# Patient Record
Sex: Male | Born: 1955
Health system: Southern US, Community
[De-identification: ages and names within clinical notes are randomized; demographics above are authoritative.]

## PROBLEM LIST (undated history)

## (undated) DIAGNOSIS — E785 Hyperlipidemia, unspecified: Secondary | ICD-10-CM

## (undated) DIAGNOSIS — R569 Unspecified convulsions: Secondary | ICD-10-CM

## (undated) DIAGNOSIS — I10 Essential (primary) hypertension: Secondary | ICD-10-CM

## (undated) DIAGNOSIS — Z8619 Personal history of other infectious and parasitic diseases: Secondary | ICD-10-CM

## (undated) DIAGNOSIS — IMO0001 Reserved for inherently not codable concepts without codable children: Secondary | ICD-10-CM

## (undated) HISTORY — DX: Essential (primary) hypertension: I10

## (undated) HISTORY — PX: CT SCAN: SHX5351

## (undated) HISTORY — PX: TONSILLECTOMY: SUR1361

## (undated) HISTORY — DX: Personal history of other infectious and parasitic diseases: Z86.19

## (undated) HISTORY — DX: Hyperlipidemia, unspecified: E78.5

## (undated) HISTORY — PX: BACK SURGERY: SHX140

## (undated) HISTORY — PX: JOINT REPLACEMENT: SHX530

---
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
## 1991-07-19 HISTORY — PX: KNEE SURGERY: SHX244

## 2002-07-18 HISTORY — PX: ROTATOR CUFF REPAIR: SHX139

## 2005-06-10 ENCOUNTER — Ambulatory Visit: Payer: Self-pay | Admitting: Internal Medicine

## 2007-07-19 HISTORY — PX: LUNG SURGERY: SHX703

## 2007-11-03 ENCOUNTER — Emergency Department: Payer: Self-pay | Admitting: Internal Medicine

## 2007-11-03 ENCOUNTER — Other Ambulatory Visit: Payer: Self-pay

## 2008-01-24 DIAGNOSIS — Z8249 Family history of ischemic heart disease and other diseases of the circulatory system: Secondary | ICD-10-CM | POA: Insufficient documentation

## 2008-01-24 DIAGNOSIS — I10 Essential (primary) hypertension: Secondary | ICD-10-CM | POA: Insufficient documentation

## 2008-01-30 DIAGNOSIS — E782 Mixed hyperlipidemia: Secondary | ICD-10-CM

## 2008-02-25 DIAGNOSIS — F419 Anxiety disorder, unspecified: Secondary | ICD-10-CM

## 2012-03-15 LAB — PSA: PSA: 0.4

## 2012-09-22 ENCOUNTER — Emergency Department: Payer: Self-pay | Admitting: Internal Medicine

## 2012-10-23 ENCOUNTER — Ambulatory Visit: Payer: Self-pay | Admitting: Family Medicine

## 2013-12-27 ENCOUNTER — Ambulatory Visit: Payer: Self-pay | Admitting: Family Medicine

## 2014-02-21 ENCOUNTER — Other Ambulatory Visit: Payer: Self-pay | Admitting: Family Medicine

## 2014-02-21 DIAGNOSIS — H539 Unspecified visual disturbance: Secondary | ICD-10-CM

## 2014-02-21 DIAGNOSIS — R51 Headache: Secondary | ICD-10-CM

## 2014-02-21 DIAGNOSIS — R42 Dizziness and giddiness: Secondary | ICD-10-CM

## 2014-02-21 DIAGNOSIS — R519 Headache, unspecified: Secondary | ICD-10-CM

## 2014-02-27 ENCOUNTER — Ambulatory Visit
Admission: RE | Admit: 2014-02-27 | Discharge: 2014-02-27 | Disposition: A | Discharge status: Home / Self Care | Payer: BC Managed Care – PPO | Source: Ambulatory Visit | Attending: Family Medicine | Admitting: Family Medicine

## 2014-02-27 DIAGNOSIS — R51 Headache: Secondary | ICD-10-CM

## 2014-02-27 DIAGNOSIS — R42 Dizziness and giddiness: Secondary | ICD-10-CM

## 2014-02-27 DIAGNOSIS — R519 Headache, unspecified: Secondary | ICD-10-CM

## 2014-02-27 DIAGNOSIS — H539 Unspecified visual disturbance: Secondary | ICD-10-CM

## 2014-02-27 MED ORDER — GADOBENATE DIMEGLUMINE 529 MG/ML IV SOLN
18.0000 mL | Freq: Once | INTRAVENOUS | Status: AC | PRN
  Administered 2014-02-27: 18 mL via INTRAVENOUS

## 2014-07-31 ENCOUNTER — Ambulatory Visit: Payer: Self-pay

## 2014-08-25 ENCOUNTER — Emergency Department: Payer: Self-pay | Admitting: Emergency Medicine

## 2014-08-25 LAB — COMPREHENSIVE METABOLIC PANEL
ALK PHOS: 99 U/L (ref 46–116)
ALT: 29 U/L (ref 14–63)
AST: 26 U/L (ref 15–37)
Albumin: 3.6 g/dL (ref 3.4–5.0)
Anion Gap: 8 (ref 7–16)
BILIRUBIN TOTAL: 0.3 mg/dL (ref 0.2–1.0)
BUN: 14 mg/dL (ref 7–18)
CALCIUM: 8.4 mg/dL — AB (ref 8.5–10.1)
CO2: 25 mmol/L (ref 21–32)
Chloride: 108 mmol/L — ABNORMAL HIGH (ref 98–107)
Creatinine: 0.99 mg/dL (ref 0.60–1.30)
EGFR (African American): >60
Glucose: 112 mg/dL — ABNORMAL HIGH (ref 65–99)
Osmolality: 282 (ref 275–301)
Potassium: 4 mmol/L (ref 3.5–5.1)
Sodium: 141 mmol/L (ref 136–145)
Total Protein: 6.8 g/dL (ref 6.4–8.2)

## 2014-08-25 LAB — CBC
HCT: 41.4 % (ref 40.0–52.0)
HGB: 13.7 g/dL (ref 13.0–18.0)
MCH: 30.4 pg (ref 26.0–34.0)
MCHC: 33.2 g/dL (ref 32.0–36.0)
MCV: 92 fL (ref 80–100)
Platelet: 235 10*3/uL (ref 150–440)
RBC: 4.51 10*6/uL (ref 4.40–5.90)
RDW: 13.3 % (ref 11.5–14.5)
WBC: 8.7 10*3/uL (ref 3.8–10.6)

## 2014-08-25 LAB — TROPONIN I: Troponin-I: <0.02 ng/mL

## 2014-08-25 LAB — LIPASE, BLOOD: Lipase: 191 U/L (ref 73–393)

## 2014-10-08 LAB — BASIC METABOLIC PANEL
BUN: 17 mg/dL (ref 4–21)
Creatinine: 0.9 mg/dL (ref 0.6–1.3)
Glucose: 97 mg/dL
Potassium: 4.9 mmol/L (ref 3.4–5.3)
Sodium: 140 mmol/L (ref 137–147)

## 2014-10-08 LAB — LIPID PANEL
CHOLESTEROL: 167 mg/dL (ref 0–200)
HDL: 45 mg/dL (ref 35–70)
LDL Cholesterol: 81 mg/dL
Triglycerides: 204 mg/dL — AB (ref 40–160)

## 2014-10-08 LAB — CBC AND DIFFERENTIAL
HCT: 42 % (ref 41–53)
Hemoglobin: 14.2 g/dL (ref 13.5–17.5)
Platelets: 304 10*3/uL (ref 150–399)
WBC: 8.6 10^3/mL

## 2014-10-08 LAB — HEPATIC FUNCTION PANEL
ALT: 20 U/L (ref 10–40)
AST: 16 U/L (ref 14–40)

## 2014-12-29 ENCOUNTER — Other Ambulatory Visit: Payer: Self-pay | Admitting: *Deleted

## 2014-12-29 MED ORDER — HYDROCODONE-ACETAMINOPHEN 7.5-325 MG PO TABS: 2.0000 | ORAL_TABLET | ORAL | Status: DC | PRN

## 2014-12-29 NOTE — Telephone Encounter (Signed)
Refill request for Hydrocodone 7-325 mg Last filled by MD on- 12/12/2014 #100 x0 Last Appt: 10/07/2014 Next Appt: none Please advise refill?

## 2015-01-05 ENCOUNTER — Other Ambulatory Visit: Payer: Self-pay | Admitting: Family Medicine

## 2015-01-09 ENCOUNTER — Ambulatory Visit: Payer: No Typology Code available for payment source | Admitting: Family Medicine

## 2015-01-12 DIAGNOSIS — S01111A Laceration without foreign body of right eyelid and periocular area, initial encounter: Secondary | ICD-10-CM | POA: Insufficient documentation

## 2015-01-12 DIAGNOSIS — F329 Major depressive disorder, single episode, unspecified: Secondary | ICD-10-CM | POA: Insufficient documentation

## 2015-01-12 DIAGNOSIS — M549 Dorsalgia, unspecified: Secondary | ICD-10-CM

## 2015-01-12 DIAGNOSIS — R55 Syncope and collapse: Secondary | ICD-10-CM | POA: Insufficient documentation

## 2015-01-12 DIAGNOSIS — S229XXA Fracture of bony thorax, part unspecified, initial encounter for closed fracture: Secondary | ICD-10-CM

## 2015-01-12 DIAGNOSIS — N529 Male erectile dysfunction, unspecified: Secondary | ICD-10-CM | POA: Insufficient documentation

## 2015-01-12 DIAGNOSIS — R0602 Shortness of breath: Secondary | ICD-10-CM | POA: Insufficient documentation

## 2015-01-12 DIAGNOSIS — G4700 Insomnia, unspecified: Secondary | ICD-10-CM | POA: Insufficient documentation

## 2015-01-12 DIAGNOSIS — R51 Headache: Secondary | ICD-10-CM

## 2015-01-12 DIAGNOSIS — M25562 Pain in left knee: Secondary | ICD-10-CM | POA: Insufficient documentation

## 2015-01-12 DIAGNOSIS — M7022 Olecranon bursitis, left elbow: Secondary | ICD-10-CM | POA: Insufficient documentation

## 2015-01-12 DIAGNOSIS — R194 Change in bowel habit: Secondary | ICD-10-CM | POA: Insufficient documentation

## 2015-01-12 DIAGNOSIS — F32A Depression, unspecified: Secondary | ICD-10-CM | POA: Insufficient documentation

## 2015-01-12 DIAGNOSIS — N4601 Organic azoospermia: Secondary | ICD-10-CM | POA: Insufficient documentation

## 2015-01-12 DIAGNOSIS — G8929 Other chronic pain: Secondary | ICD-10-CM | POA: Insufficient documentation

## 2015-01-12 DIAGNOSIS — M5030 Other cervical disc degeneration, unspecified cervical region: Secondary | ICD-10-CM | POA: Insufficient documentation

## 2015-01-12 DIAGNOSIS — R519 Headache, unspecified: Secondary | ICD-10-CM | POA: Insufficient documentation

## 2015-01-12 DIAGNOSIS — H538 Other visual disturbances: Secondary | ICD-10-CM | POA: Insufficient documentation

## 2015-01-12 DIAGNOSIS — E291 Testicular hypofunction: Secondary | ICD-10-CM | POA: Insufficient documentation

## 2015-01-12 DIAGNOSIS — E669 Obesity, unspecified: Secondary | ICD-10-CM | POA: Insufficient documentation

## 2015-01-12 DIAGNOSIS — K649 Unspecified hemorrhoids: Secondary | ICD-10-CM | POA: Insufficient documentation

## 2015-01-12 DIAGNOSIS — F410 Panic disorder [episodic paroxysmal anxiety] without agoraphobia: Secondary | ICD-10-CM | POA: Insufficient documentation

## 2015-01-12 DIAGNOSIS — R42 Dizziness and giddiness: Secondary | ICD-10-CM | POA: Insufficient documentation

## 2015-01-12 HISTORY — DX: Fracture of bony thorax, part unspecified, initial encounter for closed fracture: S22.9XXA

## 2015-01-13 ENCOUNTER — Encounter: Payer: Self-pay | Admitting: Family Medicine

## 2015-01-13 ENCOUNTER — Ambulatory Visit (INDEPENDENT_AMBULATORY_CARE_PROVIDER_SITE_OTHER): Payer: No Typology Code available for payment source | Admitting: Family Medicine

## 2015-01-13 VITALS — BP 144/88 | HR 128 | Temp 97.8°F | Resp 18 | Wt 206.0 lb

## 2015-01-13 DIAGNOSIS — G4700 Insomnia, unspecified: Secondary | ICD-10-CM | POA: Diagnosis not present

## 2015-01-13 DIAGNOSIS — M5432 Sciatica, left side: Secondary | ICD-10-CM

## 2015-01-13 MED ORDER — METHOCARBAMOL 750 MG PO TABS: 750.0000 mg | ORAL_TABLET | Freq: Four times a day (QID) | ORAL | Status: DC

## 2015-01-13 MED ORDER — HYDROCODONE-ACETAMINOPHEN 7.5-325 MG PO TABS: ORAL_TABLET | ORAL | Status: DC

## 2015-01-13 MED ORDER — ZOLPIDEM TARTRATE ER 12.5 MG PO TBCR: 12.5000 mg | EXTENDED_RELEASE_TABLET | Freq: Every evening | ORAL | Status: DC | PRN

## 2015-01-13 MED ORDER — PREDNISONE 10 MG PO TABS: ORAL_TABLET | ORAL | Status: DC

## 2015-01-13 NOTE — Progress Notes (Signed)
Patient ID: Dylan [name], male   DOB: October 26, 1955, 59 y.o.   MRN: [id number]       Patient: Dylan [name] Male    DOB: 01-Feb-1956   59 y.o.   MRN: [id number] Visit Date: 01/13/2015  Today's Provider: [name], MD   Chief Complaint  Patient presents with  . Back Pain  . Headache   Subjective:    Back Pain This is a new (He reports that this back pain is not like his other back pain has been) problem. Episode onset: " a couple weeks" The problem occurs constantly. The quality of the pain is described as shooting, stabbing and burning. The pain radiates to the left knee. Associated symptoms include headaches, leg pain and paresthesias. Pertinent negatives include no abdominal pain, bladder incontinence, bowel incontinence, chest pain, dysuria, fever, numbness, paresis, pelvic pain, perianal numbness, tingling, weakness or weight loss. Risk factors include recent trauma (Pt thinks he may have hurt it working in the yard).  Headache  This is a chronic problem. The current episode started more than 1 month ago. The problem occurs daily. The problem has been unchanged. The pain is located in the occipital region. The pain quality is similar to prior headaches. Associated symptoms include back pain. Pertinent negatives include no abdominal pain, fever, numbness, tingling, weakness or weight loss.  Pt reports that he has had these headaches since his head trauma back in February. He thinks it could be related to stress and depression. He is going out of town this weekend and thinks this could help him but wants to know if Dr. Sherrie Mustache feels he is ok to go.   Back pain suddenly while doing yard work. Pain goes into left groin and into back of  knee.     Previous Medications   AMLODIPINE (NORVASC) 10 MG TABLET    Take by mouth.   ASPIRIN 81 MG TABLET    Take by mouth.   BUTALBITAL-APAP-CAFFEINE 50-325-40 MG PER CAPSULE    Take by mouth.   DIPHENOXYLATE-ATROPINE (LOMOTIL) 2.5-0.025 MG PER TABLET    Take  by mouth.   HYDROCODONE-ACETAMINOPHEN (NORCO) 7.5-325 MG PER TABLET    Take 2 tablets by mouth every 4 (four) hours as needed for moderate pain.   LORAZEPAM (ATIVAN) 1 MG TABLET    Take by mouth.   METOPROLOL TARTRATE (LOPRESSOR) 25 MG TABLET    Take by mouth.   SIMVASTATIN (ZOCOR) 40 MG TABLET    Take by mouth.   TRAZODONE (DESYREL) 50 MG TABLET    1-3 TABLET, ORAL, AT BEDTIME   ZOLPIDEM (AMBIEN) 10 MG TABLET    Take by mouth.    Review of Systems  Constitutional: Negative.  Negative for fever and weight loss.  Eyes: Negative.   Respiratory: Negative.   Cardiovascular: Negative.  Negative for chest pain.  Gastrointestinal: Negative.  Negative for abdominal pain and bowel incontinence.  Endocrine: Negative.   Genitourinary: Negative.  Negative for bladder incontinence, dysuria and pelvic pain.  Musculoskeletal: Positive for back pain.  Skin: Negative.   Allergic/Immunologic: Negative.   Neurological: Positive for headaches and paresthesias. Negative for tingling, weakness and numbness.  Hematological: Negative.   Psychiatric/Behavioral: Negative.     History  Substance Use Topics  . Smoking status: Former Smoker -- 1.00 packs/day for 30 years    Types: Cigarettes    Quit date: 07/18/2002  . Smokeless tobacco: Not on file  . Alcohol Use: No   Objective:   BP 144/88 mmHg  Pulse  128  Temp(Src) 97.8 F (36.6 C)  Resp 18  Wt 206 lb (93.441 kg)  Physical Exam  General Appearance:    Alert, cooperative, no distress  Eyes:    PERRL, conjunctiva/corneas clear, EOM's intact       Lungs:     Clear to auscultation bilaterally, respirations unlabored  Heart:    Regular rate and rhythm  Neurologic:   Awake, alert, oriented x 3. No apparent focal neurological           defect.   MS:   Tender lower lumbar spine and left paralumbar muscles. Left hip extension limited due to pain upon flexion. No gross deformities.        Assessment & Plan:     1. Sciatica associated with disorder  of lumbar spine, left  - predniSONE (DELTASONE) 10 MG tablet; 6 tablets for 2 days, then 5 for 2 days, then 4 for 2 days, then 3 for 2 days, then 2 for 2 days, then 1 for 2 days.  Dispense: 42 tablet; Refill: 0 - methocarbamol (ROBAXIN-750) 750 MG tablet; Take 1 tablet (750 mg total) by mouth 4 (four) times daily.  Dispense: 30 tablet; Refill: 1 - HYDROcodone-acetaminophen (NORCO) 7.5-325 MG per tablet; 1-2 every four hours as needed  Dispense: 100 tablet; Refill: 0 Call if symptoms change or if not rapidly improving.     2. Insomnia He states Remus Lofflerambien works well, but wears off too early. Will change to extended releast.  - zolpidem (AMBIEN CR) 12.5 MG CR tablet; Take 1 tablet (12.5 mg total) by mouth at bedtime as needed for sleep.  Dispense: 30 tablet; Refill: 1  Follow up: No Follow-up on file.      Mila Merryonald Tej Murdaugh, MD  [facility]

## 2015-01-13 NOTE — Patient Instructions (Signed)
Sciatica Sciatica is pain, weakness, numbness, or tingling along the path of the sciatic nerve. The nerve starts in the lower back and runs down the back of each leg. The nerve controls the muscles in the lower leg and in the back of the knee, while also providing sensation to the back of the thigh, lower leg, and the sole of your foot. Sciatica is a symptom of another medical condition. For instance, nerve damage or certain conditions, such as a herniated disk or bone spur on the spine, pinch or put pressure on the sciatic nerve. This causes the pain, weakness, or other sensations normally associated with sciatica. Generally, sciatica only affects one side of the body. CAUSES   Herniated or slipped disc.  Degenerative disk disease.  A pain disorder involving the narrow muscle in the buttocks (piriformis syndrome).  Pelvic injury or fracture.  Pregnancy.  Tumor (rare). SYMPTOMS  Symptoms can vary from mild to very severe. The symptoms usually travel from the low back to the buttocks and down the back of the leg. Symptoms can include:  Mild tingling or dull aches in the lower back, leg, or hip.  Numbness in the back of the calf or sole of the foot.  Burning sensations in the lower back, leg, or hip.  Sharp pains in the lower back, leg, or hip.  Leg weakness.  Severe back pain inhibiting movement. These symptoms may get worse with coughing, sneezing, laughing, or prolonged sitting or standing. Also, being overweight may worsen symptoms. DIAGNOSIS  Your caregiver will perform a physical exam to look for common symptoms of sciatica. He or she may ask you to do certain movements or activities that would trigger sciatic nerve pain. Other tests may be performed to find the cause of the sciatica. These may include:  Blood tests.  X-rays.  Imaging tests, such as an MRI or CT scan. TREATMENT  Treatment is directed at the cause of the sciatic pain. Sometimes, treatment is not necessary  and the pain and discomfort goes away on its own. If treatment is needed, your caregiver may suggest:  Over-the-counter medicines to relieve pain.  Prescription medicines, such as anti-inflammatory medicine, muscle relaxants, or narcotics.  Applying heat or ice to the painful area.  Steroid injections to lessen pain, irritation, and inflammation around the nerve.  Reducing activity during periods of pain.  Exercising and stretching to strengthen your abdomen and improve flexibility of your spine. Your caregiver may suggest losing weight if the extra weight makes the back pain worse.  Physical therapy.  Surgery to eliminate what is pressing or pinching the nerve, such as a bone spur or part of a herniated disk. HOME CARE INSTRUCTIONS   Only take over-the-counter or prescription medicines for pain or discomfort as directed by your caregiver.  Apply ice to the affected area for 20 minutes, 3-4 times a day for the first 48-72 hours. Then try heat in the same way.  Exercise, stretch, or perform your usual activities if these do not aggravate your pain.  Attend physical therapy sessions as directed by your caregiver.  Keep all follow-up appointments as directed by your caregiver.  Do not wear high heels or shoes that do not provide proper support.  Check your mattress to see if it is too soft. A firm mattress may lessen your pain and discomfort. SEEK IMMEDIATE MEDICAL CARE IF:   You lose control of your bowel or bladder (incontinence).  You have increasing weakness in the lower back, pelvis, buttocks,   or legs.  You have redness or swelling of your back.  You have a burning sensation when you urinate.  You have pain that gets worse when you lie down or awakens you at night.  Your pain is worse than you have experienced in the past.  Your pain is lasting longer than 4 weeks.  You are suddenly losing weight without reason. MAKE SURE YOU:  Understand these  instructions.  Will watch your condition.  Will get help right away if you are not doing well or get worse. Document Released: 06/28/2001 Document Revised: 01/03/2012 Document Reviewed: 11/13/2011 ExitCare Patient Information 2015 [employer]. This information is not intended to replace advice given to you by your health care provider. Make sure you discuss any questions you have with your health care provider.  

## 2015-01-23 ENCOUNTER — Other Ambulatory Visit: Payer: Self-pay | Admitting: Family Medicine

## 2015-01-23 ENCOUNTER — Other Ambulatory Visit: Payer: Self-pay | Admitting: *Deleted

## 2015-01-23 ENCOUNTER — Telehealth: Payer: Self-pay | Admitting: Family Medicine

## 2015-01-23 MED ORDER — LORAZEPAM 1 MG PO TABS: 1.0000 mg | ORAL_TABLET | Freq: Two times a day (BID) | ORAL | Status: DC

## 2015-01-23 NOTE — Telephone Encounter (Signed)
Pt would like Dr. Sherrie MustacheFisher to take over medication management for Lorazepam 1 mg because  Dr. Fannie KneeSue that was prescribing this medication needs to see pt for a f/u but pt stated he can not afford to pay for the OV because it would cost him over 100$ out of pocket. Pt stated that he thought he explained this at his last OV. Pt stated that he has one dose left and really needs this medication. [facility] Thanks TNP

## 2015-01-23 NOTE — Progress Notes (Signed)
Phoned in.

## 2015-01-23 NOTE — Telephone Encounter (Signed)
Refill request for Lorazepam 1 mg Last filled by MD on- Does not appear to have been filled by pcp before Last Appt: 01/13/2015 Next Appt: none Please advise refill?

## 2015-01-23 NOTE — Progress Notes (Signed)
Please call in lorazepam 1mg  BID #50, RF x 3 to [facility]. Thanks.

## 2015-01-28 ENCOUNTER — Other Ambulatory Visit: Payer: Self-pay | Admitting: *Deleted

## 2015-01-28 DIAGNOSIS — M5432 Sciatica, left side: Secondary | ICD-10-CM

## 2015-01-28 MED ORDER — HYDROCODONE-ACETAMINOPHEN 7.5-325 MG PO TABS
ORAL_TABLET | ORAL | Status: DC
Start: 2015-01-28 — End: 2015-02-10

## 2015-02-04 ENCOUNTER — Telehealth: Payer: Self-pay | Admitting: Family Medicine

## 2015-02-04 DIAGNOSIS — M5030 Other cervical disc degeneration, unspecified cervical region: Secondary | ICD-10-CM

## 2015-02-04 DIAGNOSIS — M549 Dorsalgia, unspecified: Secondary | ICD-10-CM

## 2015-02-04 DIAGNOSIS — M5432 Sciatica, left side: Secondary | ICD-10-CM

## 2015-02-04 DIAGNOSIS — G8929 Other chronic pain: Secondary | ICD-10-CM

## 2015-02-04 NOTE — Telephone Encounter (Signed)
Patient stated that he has been in a lot of pain, in back and legs. Patient has completed medications that were given at last ov. Patient wanted to know what he can do to help with the pain?

## 2015-02-04 NOTE — Telephone Encounter (Signed)
Pt called still having really bad back pain.  Would like someone to call him back.   [phone].    Thanks, Barth Kirkseri

## 2015-02-05 MED ORDER — PREDNISONE 10 MG PO TABS: ORAL_TABLET | ORAL | Status: AC

## 2015-02-05 NOTE — Telephone Encounter (Signed)
Patient was prescribed prednisone and methocarbamol at his visit in June. Need to know how these medications worked. Thanks.

## 2015-02-05 NOTE — Telephone Encounter (Signed)
Need to take another round of prednisone to get inflammation under control. Also recommend referral to physical therapy. Have sent refill of prednisone to pharmacy and order for physical therapy to referrals.

## 2015-02-05 NOTE — Telephone Encounter (Signed)
Patient states he took the Prednisone and the Methocarbamol and it took a while to work. Eventually the pain got better on the left side but then moved to the right side. Patient states he has had to sleep in a recliner because he cant stretch out his legs without having pain. Patient states it hurts whenever he puts weight on his leg. He has been using his pain medication to help with the pain also. Patient has completed the prednisone and has 3 more pills left of the Methocarbamol. Patient states pain has improved since the last visit but is still persistent.

## 2015-02-05 NOTE — Telephone Encounter (Signed)
Patient called back to check on this message. °

## 2015-02-06 ENCOUNTER — Other Ambulatory Visit: Payer: Self-pay | Admitting: *Deleted

## 2015-02-06 ENCOUNTER — Other Ambulatory Visit: Payer: Self-pay | Admitting: Family Medicine

## 2015-02-06 DIAGNOSIS — M5432 Sciatica, left side: Secondary | ICD-10-CM

## 2015-02-06 MED ORDER — METHOCARBAMOL 750 MG PO TABS: 750.0000 mg | ORAL_TABLET | Freq: Four times a day (QID) | ORAL | Status: DC

## 2015-02-06 NOTE — Telephone Encounter (Signed)
Patient is also requesting refill for Robaxin. Patient stated that he does not have any refills left.

## 2015-02-10 ENCOUNTER — Other Ambulatory Visit: Payer: Self-pay | Admitting: Family Medicine

## 2015-02-10 DIAGNOSIS — M5432 Sciatica, left side: Secondary | ICD-10-CM

## 2015-02-10 MED ORDER — HYDROCODONE-ACETAMINOPHEN 7.5-325 MG PO TABS: ORAL_TABLET | ORAL | Status: DC

## 2015-02-10 NOTE — Telephone Encounter (Signed)
Pt contacted office for refill request on the following medications: HYDROcodone-acetaminophen (NORCO) 7.5-325 MG per tablet Pt stated he is out of the medication and has been requesting this since last week so he wouldn't run out of the medication. Pt stated he needs to pick this up ASAP. Thanks TNP

## 2015-02-23 ENCOUNTER — Ambulatory Visit (INDEPENDENT_AMBULATORY_CARE_PROVIDER_SITE_OTHER): Payer: No Typology Code available for payment source | Admitting: Family Medicine

## 2015-02-23 ENCOUNTER — Other Ambulatory Visit: Payer: Self-pay | Admitting: Family Medicine

## 2015-02-23 ENCOUNTER — Encounter: Payer: Self-pay | Admitting: Family Medicine

## 2015-02-23 VITALS — BP 140/100 | HR 118 | Temp 97.9°F | Resp 18 | Wt 203.4 lb

## 2015-02-23 DIAGNOSIS — I10 Essential (primary) hypertension: Secondary | ICD-10-CM

## 2015-02-23 DIAGNOSIS — M545 Low back pain, unspecified: Secondary | ICD-10-CM

## 2015-02-23 DIAGNOSIS — M5432 Sciatica, left side: Secondary | ICD-10-CM

## 2015-02-23 MED ORDER — HYDROCODONE-ACETAMINOPHEN 7.5-325 MG PO TABS: ORAL_TABLET | ORAL | Status: DC

## 2015-02-23 NOTE — Progress Notes (Signed)
Patient ID: Dylan [name], [name]   DOB: Apr 06, 1956, 59 y.o.   MRN: [id number]       Patient: Dylan [name]    DOB: 02-10-1956   59 y.o.   MRN: [id number] Visit Date: 02/23/2015  Today's Provider: [name], MD   Chief Complaint  Patient presents with  . Leg Pain    right leg pain, left leg pain moved to rigth leg after the second round of the prednisone order" pt stated, pain level 8 out of 10. took some norco today, stated that he ran out of it today.  . Insomnia    takes amlodipine or trazodone for imsomnia, stated that nothing is helping.   Subjective:    Leg Pain  Incident onset:  befiore starting the 2nd dose of prednisone. The pain is present in the right leg. Quality: very sharpe pain from up the leg to the foot." The pain is at a severity of 8/10. The pain is severe. The pain has been worsening since onset. Associated symptoms include an inability to bear weight, muscle weakness, numbness and tingling. The symptoms are aggravated by movement (getts worst with laying down, and walking"). He has tried heat (hlep for a lille bit.) for the symptoms. The treatment provided mild relief.  Insomnia Primary symptoms: difficulty falling asleep, napping.    He has had multiple rounds of prednisone since his visit at the end of June and doesn't feel like pain has improved with it all. The Robaxin and hydrocodone seem to have had the most effect. He is taking 6-8 hydrocodone a day which he states relieves pain by about 50 percent. Pain is now radiating into he right  leg instead of his left and he has felt some weakness in this leg. Back pain has worsened since he was here in June.     Allergies  Allergen Reactions  . Belsomra  [Suvorexant]     Hallucinations  . Duloxetine     Nervous  . Meloxicam     Stomach cramps  . Morphine Other (See Comments)   Previous Medications   AMLODIPINE (NORVASC) 10 MG TABLET    Take by mouth.   ASPIRIN 81 MG TABLET    Take by mouth.   BUTALBITAL-APAP-CAFFEINE 50-325-40 MG PER CAPSULE    Take by mouth.   DIPHENOXYLATE-ATROPINE (LOMOTIL) 2.5-0.025 MG PER TABLET    Take by mouth.   HYDROCODONE-ACETAMINOPHEN (NORCO) 7.5-325 MG PER TABLET    1-2 every four hours as needed   LORAZEPAM (ATIVAN) 1 MG TABLET    Take 1 tablet (1 mg total) by mouth 2 (two) times daily.   METHOCARBAMOL (ROBAXIN-750) 750 MG TABLET    Take 1 tablet (750 mg total) by mouth 4 (four) times daily.   METOPROLOL TARTRATE (LOPRESSOR) 25 MG TABLET    TAKE 1 TABLET TWICE A DAY   SIMVASTATIN (ZOCOR) 40 MG TABLET    Take by mouth.   TRAZODONE (DESYREL) 50 MG TABLET    1-3 TABLET, ORAL, AT BEDTIME   ZOLPIDEM (AMBIEN CR) 12.5 MG CR TABLET    Take 1 tablet (12.5 mg total) by mouth at bedtime as needed for sleep.    Review of Systems  Respiratory: Negative for shortness of breath.   Cardiovascular: Negative for leg swelling.  Gastrointestinal: Negative for abdominal pain.  Neurological: Positive for tingling, weakness and numbness. Negative for headaches.  Psychiatric/Behavioral: The patient has insomnia.     History  Substance Use Topics  . Smoking status: Former Smoker --  1.00 packs/day for 30 years    Types: Cigarettes    Quit date: 07/18/2002  . Smokeless tobacco: Not on file  . Alcohol Use: No   Objective:   BP 140/100 mmHg  Pulse 118  Temp(Src) 97.9 F (36.6 C) (Oral)  Resp 18  Wt 203 lb 6.4 oz (92.262 kg)  SpO2 99%  Physical Exam   General Appearance:    Alert, cooperative, no distress  Eyes:    PERRL, conjunctiva/corneas clear, EOM's intact       Lungs:     Clear to auscultation bilaterally, respirations unlabored  Heart:    Regular rate and rhythm  Neurologic:   Awake, alert, oriented x 3. Right leg strength+4/5 +1 DTRs Right, slightly reduced compared to left           Assessment & Plan:     1. Low back pain potentially associated with radiculopathy  - MR Lumbar Spine Wo Contrast; Future  2. Sciatica associated with disorder of  lumbar spine, left  - HYDROcodone-acetaminophen (NORCO) 7.5-325 MG per tablet; 1-2 every four hours as needed  Dispense: 180 tablet; Refill: 0       [name], MD  [facility]

## 2015-02-25 ENCOUNTER — Ambulatory Visit
Admission: RE | Admit: 2015-02-25 | Discharge: 2015-02-25 | Disposition: A | Discharge status: Home / Self Care | Payer: No Typology Code available for payment source | Source: Ambulatory Visit | Attending: Family Medicine | Admitting: Family Medicine

## 2015-02-25 ENCOUNTER — Telehealth: Payer: Self-pay | Admitting: *Deleted

## 2015-02-25 DIAGNOSIS — M5126 Other intervertebral disc displacement, lumbar region: Secondary | ICD-10-CM

## 2015-02-25 DIAGNOSIS — M4807 Spinal stenosis, lumbosacral region: Secondary | ICD-10-CM | POA: Insufficient documentation

## 2015-02-25 DIAGNOSIS — G544 Lumbosacral root disorders, not elsewhere classified: Secondary | ICD-10-CM | POA: Insufficient documentation

## 2015-02-25 DIAGNOSIS — M545 Low back pain, unspecified: Secondary | ICD-10-CM

## 2015-02-25 DIAGNOSIS — M5127 Other intervertebral disc displacement, lumbosacral region: Secondary | ICD-10-CM | POA: Diagnosis not present

## 2015-02-25 MED ORDER — PREDNISONE 20 MG PO TABS: ORAL_TABLET | ORAL | Status: DC

## 2015-02-25 NOTE — Telephone Encounter (Signed)
-----   Message from Malva Limes, MD sent at 02/25/2015  1:42 PM EDT ----- MRI shows several herniated disks, including large herniated disk compressing L5nerve root. Need to start back on high dose of prednisone to prevent nerve damage and need referral to neurosurgery for further evaluation and treatment options. Please send in rx for prednisone , 3 tablets a day for 4 days, then 2 tablets a day for 4 days, then 1 tablet a day, #30, rf x 1. He needs to continue prednisone  until seen by specialist.

## 2015-02-25 NOTE — Telephone Encounter (Signed)
Pt states that he was told you were going to refer him to a neurosurgeon.Please add referral to Epic

## 2015-02-25 NOTE — Telephone Encounter (Signed)
Patient notified of results. Expressed understanding. Rx sent to [facility].

## 2015-02-26 ENCOUNTER — Telehealth: Payer: Self-pay | Admitting: Family Medicine

## 2015-02-26 DIAGNOSIS — M5432 Sciatica, left side: Secondary | ICD-10-CM

## 2015-02-26 MED ORDER — METHOCARBAMOL 750 MG PO TABS: 750.0000 mg | ORAL_TABLET | Freq: Four times a day (QID) | ORAL | Status: DC

## 2015-02-26 NOTE — Telephone Encounter (Signed)
Please review. Thanks!  

## 2015-02-26 NOTE — Telephone Encounter (Signed)
rx has been [facility] [address]

## 2015-02-26 NOTE — Telephone Encounter (Signed)
Pt contacted office for refill request on the following medications: methocarbamol (ROBAXIN-750) 750 MG tablet to [facility]. [employer]. Pt stated he will need this by tomorrow because he doesn't have enough for the weekend. Thanks TNP

## 2015-02-26 NOTE — Telephone Encounter (Signed)
Advised patient

## 2015-03-03 ENCOUNTER — Other Ambulatory Visit: Payer: Self-pay | Admitting: Neurosurgery

## 2015-03-10 ENCOUNTER — Other Ambulatory Visit: Payer: Self-pay | Admitting: Family Medicine

## 2015-03-10 DIAGNOSIS — G4700 Insomnia, unspecified: Secondary | ICD-10-CM

## 2015-03-10 MED ORDER — ZOLPIDEM TARTRATE ER 12.5 MG PO TBCR: 12.5000 mg | EXTENDED_RELEASE_TABLET | Freq: Every evening | ORAL | Status: DC | PRN

## 2015-03-10 NOTE — Telephone Encounter (Signed)
Rx called in to pharmacy. 

## 2015-03-10 NOTE — Pre-Procedure Instructions (Signed)
  Dylan [name]  03/10/2015       [id number] [place] [place] - [address] [place] [address] Phone: [phone] Fax: [phone]  [facility] [address] [facility] [place], [place] - [address] [facility] [address] [place] [address] Phone: [phone] Fax: [phone]    Your procedure is scheduled on Wednesday, August 31st. .  Report to [facility] Admitting at 10:45 AM             (Arrival time is per your surgeon's request)   Call this number if you have problems the morning of surgery:  [phone]   Remember:  Do not eat food or drink liquids after midnight Tuesday.  Take these medicines the morning of surgery with A SIP OF WATER : Norvasc (Amlodipine), Hydrocodone (Norco), Ativan (if needed), Metoprolol (Lopressor)   Do not wear jewelry - no rings or watches.  Do not wear lotions or colognes.   You may NOT wear deodorant the day of surgery.             Men may shave face and neck.   Do not bring valuables to the hospital.  [facility] is not responsible for any belongings or valuables.  Contacts, dentures or bridgework may not be worn into surgery.  Leave your suitcase in the car.  After surgery it may be brought to your room. For patients admitted to the hospital, discharge time will be determined by your treatment team.  Name and phone number of your driver:     Special instructions:  "Preparing for Surgery" instruction sheet.  Please read over the following fact sheets that you were given. Pain Booklet, Coughing and Deep Breathing, MRSA Information and Surgical Site Infection Prevention

## 2015-03-10 NOTE — Telephone Encounter (Signed)
Pt contacted office for refill request on the following medications:  zolpidem (AMBIEN CR) 12.5 MG.  [facility] [employer].  [phone]

## 2015-03-10 NOTE — Telephone Encounter (Signed)
Please call in Zolpidem

## 2015-03-11 ENCOUNTER — Encounter (HOSPITAL_COMMUNITY)
Admission: RE | Admit: 2015-03-11 | Discharge: 2015-03-11 | Disposition: A | Discharge status: Home / Self Care | Payer: No Typology Code available for payment source | Source: Ambulatory Visit | Attending: Anesthesiology | Admitting: Anesthesiology

## 2015-03-11 ENCOUNTER — Encounter (HOSPITAL_COMMUNITY)
Admission: RE | Admit: 2015-03-11 | Discharge: 2015-03-11 | Disposition: A | Discharge status: Home / Self Care | Payer: No Typology Code available for payment source | Source: Ambulatory Visit | Attending: Neurosurgery | Admitting: Neurosurgery

## 2015-03-11 ENCOUNTER — Encounter (HOSPITAL_COMMUNITY): Payer: Self-pay

## 2015-03-11 DIAGNOSIS — K219 Gastro-esophageal reflux disease without esophagitis: Secondary | ICD-10-CM | POA: Insufficient documentation

## 2015-03-11 DIAGNOSIS — M5126 Other intervertebral disc displacement, lumbar region: Secondary | ICD-10-CM | POA: Diagnosis not present

## 2015-03-11 DIAGNOSIS — Z79899 Other long term (current) drug therapy: Secondary | ICD-10-CM | POA: Insufficient documentation

## 2015-03-11 DIAGNOSIS — Z01818 Encounter for other preprocedural examination: Secondary | ICD-10-CM | POA: Insufficient documentation

## 2015-03-11 DIAGNOSIS — E785 Hyperlipidemia, unspecified: Secondary | ICD-10-CM | POA: Insufficient documentation

## 2015-03-11 DIAGNOSIS — Z01812 Encounter for preprocedural laboratory examination: Secondary | ICD-10-CM | POA: Insufficient documentation

## 2015-03-11 DIAGNOSIS — Z87891 Personal history of nicotine dependence: Secondary | ICD-10-CM | POA: Diagnosis not present

## 2015-03-11 DIAGNOSIS — I10 Essential (primary) hypertension: Secondary | ICD-10-CM | POA: Diagnosis not present

## 2015-03-11 HISTORY — DX: Reserved for inherently not codable concepts without codable children: IMO0001

## 2015-03-11 HISTORY — DX: Unspecified convulsions: R56.9

## 2015-03-11 LAB — COMPREHENSIVE METABOLIC PANEL
ALT: 22 U/L (ref 17–63)
AST: 20 U/L (ref 15–41)
Albumin: 4.5 g/dL (ref 3.5–5.0)
Alkaline Phosphatase: 67 U/L (ref 38–126)
Anion gap: 11 (ref 5–15)
BUN: 19 mg/dL (ref 6–20)
CO2: 24 mmol/L (ref 22–32)
CREATININE: 1 mg/dL (ref 0.61–1.24)
Calcium: 9.7 mg/dL (ref 8.9–10.3)
Chloride: 101 mmol/L (ref 101–111)
GFR calc Af Amer: >60 mL/min (ref >60)
GFR calc non Af Amer: >60 mL/min (ref >60)
Glucose, Bld: 125 mg/dL — ABNORMAL HIGH (ref 65–99)
POTASSIUM: 4.2 mmol/L (ref 3.5–5.1)
SODIUM: 136 mmol/L (ref 135–145)
Total Bilirubin: 0.8 mg/dL (ref 0.3–1.2)
Total Protein: 7.6 g/dL (ref 6.5–8.1)

## 2015-03-11 LAB — CBC
HCT: 47.6 % (ref 39.0–52.0)
Hemoglobin: 16.3 g/dL (ref 13.0–17.0)
MCH: 32.3 pg (ref 26.0–34.0)
MCHC: 34.2 g/dL (ref 30.0–36.0)
MCV: 94.3 fL (ref 78.0–100.0)
PLATELETS: 392 10*3/uL (ref 150–400)
RBC: 5.05 MIL/uL (ref 4.22–5.81)
RDW: 13.4 % (ref 11.5–15.5)
WBC: 19.4 10*3/uL — AB (ref 4.0–10.5)

## 2015-03-11 LAB — SURGICAL PCR SCREEN
MRSA, PCR: NEGATIVE
STAPHYLOCOCCUS AUREUS: NEGATIVE

## 2015-03-11 LAB — GLUCOSE, CAPILLARY: Glucose-Capillary: 114 mg/dL — ABNORMAL HIGH (ref 65–99)

## 2015-03-11 NOTE — Progress Notes (Signed)
PCP is Dr. [name]. Was seeing Psychiatrist   Hansen Su  @ [address] .   LOV [place]. 2016

## 2015-03-12 NOTE — Progress Notes (Addendum)
Anesthesia Chart Review:  Pt is 59 year old male scheduled for R L4-5 lumbar laminectomy/ decompression microdiscectomy on 03/18/2015 with Dr. Lovell Sheehan.   PMH includes: HTN, hyperlipidemia, seizures (when coming off benzodiazepines), GERD. Former smoker. BMI 28.   Medications include: amoxicillin (had old prescription with remaining pills, took one pill 2 days ago for toothache, none since; denies toothache or mouth infection 03/12/15), ASA, metoprolol, prednisone, zantac, simvastatin.   Preoperative labs reviewed.  WBC 19.4. Pt currently taking prednisone. Will repeat DOS.    Chest x-ray 03/11/2015 reviewed. No active cardiopulmonary disease.   EKG 09/04/2014: NSR.   Left voicemail for Darl Pikes in Dr. Lovell Sheehan office notifying her of amoxicllin use, prednisone use and WBC count.   If CBC acceptable DOS, I anticipate pt can proceed with surgery as scheduled.    Rica Mast, FNP-BC [facility]/Anesthesiology Phone: [phone] 03/12/2015 3:03 PM

## 2015-03-17 MED ORDER — CEFAZOLIN SODIUM-DEXTROSE 2-3 GM-% IV SOLR
2.0000 g | INTRAVENOUS | Status: AC
  Administered 2015-03-18: 2 g via INTRAVENOUS
  Filled 2015-03-17: qty 50

## 2015-03-18 ENCOUNTER — Observation Stay (HOSPITAL_COMMUNITY)
Admission: RE | Admit: 2015-03-18 | Discharge: 2015-03-19 | Disposition: A | Discharge status: Home / Self Care | Payer: No Typology Code available for payment source | Source: Ambulatory Visit | Attending: Neurosurgery | Admitting: Neurosurgery

## 2015-03-18 ENCOUNTER — Ambulatory Visit (HOSPITAL_COMMUNITY): Payer: No Typology Code available for payment source | Admitting: Anesthesiology

## 2015-03-18 ENCOUNTER — Ambulatory Visit (HOSPITAL_COMMUNITY): Payer: No Typology Code available for payment source | Admitting: Emergency Medicine

## 2015-03-18 ENCOUNTER — Encounter (HOSPITAL_COMMUNITY)
Admission: RE | Disposition: A | Discharge status: Home / Self Care | Payer: Self-pay | Source: Ambulatory Visit | Attending: Neurosurgery

## 2015-03-18 ENCOUNTER — Encounter (HOSPITAL_COMMUNITY): Payer: Self-pay | Admitting: Anesthesiology

## 2015-03-18 ENCOUNTER — Ambulatory Visit (HOSPITAL_COMMUNITY): Payer: No Typology Code available for payment source

## 2015-03-18 DIAGNOSIS — I252 Old myocardial infarction: Secondary | ICD-10-CM | POA: Insufficient documentation

## 2015-03-18 DIAGNOSIS — E785 Hyperlipidemia, unspecified: Secondary | ICD-10-CM | POA: Diagnosis not present

## 2015-03-18 DIAGNOSIS — Z7982 Long term (current) use of aspirin: Secondary | ICD-10-CM | POA: Insufficient documentation

## 2015-03-18 DIAGNOSIS — I10 Essential (primary) hypertension: Secondary | ICD-10-CM | POA: Diagnosis not present

## 2015-03-18 DIAGNOSIS — Z87891 Personal history of nicotine dependence: Secondary | ICD-10-CM | POA: Diagnosis not present

## 2015-03-18 DIAGNOSIS — Z79899 Other long term (current) drug therapy: Secondary | ICD-10-CM | POA: Diagnosis not present

## 2015-03-18 DIAGNOSIS — K219 Gastro-esophageal reflux disease without esophagitis: Secondary | ICD-10-CM | POA: Insufficient documentation

## 2015-03-18 DIAGNOSIS — I509 Heart failure, unspecified: Secondary | ICD-10-CM | POA: Insufficient documentation

## 2015-03-18 DIAGNOSIS — M5126 Other intervertebral disc displacement, lumbar region: Secondary | ICD-10-CM | POA: Diagnosis present

## 2015-03-18 DIAGNOSIS — Z7951 Long term (current) use of inhaled steroids: Secondary | ICD-10-CM | POA: Diagnosis not present

## 2015-03-18 DIAGNOSIS — M5116 Intervertebral disc disorders with radiculopathy, lumbar region: Secondary | ICD-10-CM | POA: Diagnosis not present

## 2015-03-18 DIAGNOSIS — Z419 Encounter for procedure for purposes other than remedying health state, unspecified: Secondary | ICD-10-CM

## 2015-03-18 HISTORY — PX: LUMBAR LAMINECTOMY/DECOMPRESSION MICRODISCECTOMY: SHX5026

## 2015-03-18 HISTORY — PX: LUMBAR DISC SURGERY: SHX700

## 2015-03-18 LAB — CBC
HEMATOCRIT: 40.5 % (ref 39.0–52.0)
Hemoglobin: 13.7 g/dL (ref 13.0–17.0)
MCH: 31.6 pg (ref 26.0–34.0)
MCHC: 33.8 g/dL (ref 30.0–36.0)
MCV: 93.5 fL (ref 78.0–100.0)
Platelets: 265 10*3/uL (ref 150–400)
RBC: 4.33 MIL/uL (ref 4.22–5.81)
RDW: 13 % (ref 11.5–15.5)
WBC: 10.4 10*3/uL (ref 4.0–10.5)

## 2015-03-18 SURGERY — LUMBAR LAMINECTOMY/DECOMPRESSION MICRODISCECTOMY 1 LEVEL
Anesthesia: General | Laterality: Right

## 2015-03-18 MED ORDER — ONDANSETRON HCL 4 MG/2ML IJ SOLN
4.0000 mg | INTRAMUSCULAR | Status: DC | PRN
  Administered 2015-03-18: 4 mg via INTRAVENOUS
  Filled 2015-03-18: qty 2

## 2015-03-18 MED ORDER — ALUM & MAG HYDROXIDE-SIMETH 200-200-20 MG/5ML PO SUSP: 30.0000 mL | Freq: Four times a day (QID) | ORAL | Status: DC | PRN

## 2015-03-18 MED ORDER — 0.9 % SODIUM CHLORIDE (POUR BTL) OPTIME
TOPICAL | Status: DC | PRN
  Administered 2015-03-18: 1000 mL

## 2015-03-18 MED ORDER — DIAZEPAM 5 MG PO TABS
5.0000 mg | ORAL_TABLET | Freq: Four times a day (QID) | ORAL | Status: DC | PRN
  Administered 2015-03-18 – 2015-03-19 (×3): 5 mg via ORAL
  Filled 2015-03-18 (×3): qty 1

## 2015-03-18 MED ORDER — ROCURONIUM BROMIDE 100 MG/10ML IV SOLN
INTRAVENOUS | Status: DC | PRN
  Administered 2015-03-18: 50 mg via INTRAVENOUS

## 2015-03-18 MED ORDER — FLUTICASONE PROPIONATE 50 MCG/ACT NA SUSP: 1.0000 | Freq: Every day | NASAL | Status: DC | PRN

## 2015-03-18 MED ORDER — OXYCODONE HCL 5 MG PO TABS
ORAL_TABLET | ORAL | Status: AC
  Filled 2015-03-18: qty 1

## 2015-03-18 MED ORDER — TAMSULOSIN HCL 0.4 MG PO CAPS
0.4000 mg | ORAL_CAPSULE | Freq: Every day | ORAL | Status: DC
  Administered 2015-03-18 – 2015-03-19 (×2): 0.4 mg via ORAL
  Filled 2015-03-18 (×2): qty 1

## 2015-03-18 MED ORDER — GLYCOPYRROLATE 0.2 MG/ML IJ SOLN
INTRAMUSCULAR | Status: DC | PRN
  Administered 2015-03-18: .7 mg via INTRAVENOUS

## 2015-03-18 MED ORDER — OXYCODONE HCL 5 MG/5ML PO SOLN: 5.0000 mg | Freq: Once | ORAL | Status: AC | PRN

## 2015-03-18 MED ORDER — FAMOTIDINE 20 MG PO TABS
20.0000 mg | ORAL_TABLET | Freq: Two times a day (BID) | ORAL | Status: DC
  Administered 2015-03-18: 20 mg via ORAL
  Filled 2015-03-18 (×3): qty 1

## 2015-03-18 MED ORDER — BUPIVACAINE-EPINEPHRINE (PF) 0.5% -1:200000 IJ SOLN
INTRAMUSCULAR | Status: DC | PRN
  Administered 2015-03-18 (×2): 10 mL via PERINEURAL

## 2015-03-18 MED ORDER — ACETAMINOPHEN 160 MG/5ML PO SOLN: 325.0000 mg | ORAL | Status: DC | PRN

## 2015-03-18 MED ORDER — OXYCODONE-ACETAMINOPHEN 5-325 MG PO TABS
1.0000 | ORAL_TABLET | ORAL | Status: DC | PRN
  Administered 2015-03-18 – 2015-03-19 (×3): 2 via ORAL
  Filled 2015-03-18 (×3): qty 2

## 2015-03-18 MED ORDER — HYDROMORPHONE HCL 1 MG/ML IJ SOLN
0.2500 mg | INTRAMUSCULAR | Status: DC | PRN
  Administered 2015-03-18 (×3): 0.5 mg via INTRAVENOUS

## 2015-03-18 MED ORDER — MIDAZOLAM HCL 2 MG/2ML IJ SOLN
INTRAMUSCULAR | Status: AC
  Filled 2015-03-18: qty 4

## 2015-03-18 MED ORDER — BISACODYL 10 MG RE SUPP: 10.0000 mg | Freq: Every day | RECTAL | Status: DC | PRN

## 2015-03-18 MED ORDER — ACETAMINOPHEN 650 MG RE SUPP: 650.0000 mg | RECTAL | Status: DC | PRN

## 2015-03-18 MED ORDER — FENTANYL CITRATE (PF) 250 MCG/5ML IJ SOLN
INTRAMUSCULAR | Status: AC
  Filled 2015-03-18: qty 5

## 2015-03-18 MED ORDER — LACTATED RINGERS IV SOLN: INTRAVENOUS | Status: DC

## 2015-03-18 MED ORDER — OXYCODONE-ACETAMINOPHEN 10-325 MG PO TABS: 1.0000 | ORAL_TABLET | ORAL | Status: DC | PRN

## 2015-03-18 MED ORDER — LIDOCAINE HCL (CARDIAC) 20 MG/ML IV SOLN
INTRAVENOUS | Status: DC | PRN
  Administered 2015-03-18: 80 mg via INTRAVENOUS

## 2015-03-18 MED ORDER — ACETAMINOPHEN 325 MG PO TABS: 650.0000 mg | ORAL_TABLET | ORAL | Status: DC | PRN

## 2015-03-18 MED ORDER — METOPROLOL TARTRATE 25 MG PO TABS
25.0000 mg | ORAL_TABLET | Freq: Two times a day (BID) | ORAL | Status: DC
  Administered 2015-03-18 – 2015-03-19 (×2): 25 mg via ORAL
  Filled 2015-03-18 (×3): qty 1

## 2015-03-18 MED ORDER — FENTANYL CITRATE (PF) 100 MCG/2ML IJ SOLN
INTRAMUSCULAR | Status: DC | PRN
  Administered 2015-03-18 (×2): 100 ug via INTRAVENOUS
  Administered 2015-03-18: 50 ug via INTRAVENOUS

## 2015-03-18 MED ORDER — FENTANYL CITRATE (PF) 100 MCG/2ML IJ SOLN
50.0000 ug | INTRAMUSCULAR | Status: DC | PRN
  Administered 2015-03-18: 50 ug via INTRAVENOUS

## 2015-03-18 MED ORDER — HYDROMORPHONE HCL 1 MG/ML IJ SOLN
INTRAMUSCULAR | Status: AC
  Filled 2015-03-18: qty 1

## 2015-03-18 MED ORDER — PROPOFOL 10 MG/ML IV BOLUS
INTRAVENOUS | Status: DC | PRN
  Administered 2015-03-18: 180 mg via INTRAVENOUS

## 2015-03-18 MED ORDER — METHOCARBAMOL 500 MG PO TABS: 750.0000 mg | ORAL_TABLET | Freq: Four times a day (QID) | ORAL | Status: DC | PRN

## 2015-03-18 MED ORDER — THROMBIN 5000 UNITS EX SOLR
CUTANEOUS | Status: DC | PRN
  Administered 2015-03-18 (×2): 5000 [IU] via TOPICAL

## 2015-03-18 MED ORDER — ZOLPIDEM TARTRATE 5 MG PO TABS: 5.0000 mg | ORAL_TABLET | Freq: Every evening | ORAL | Status: DC | PRN

## 2015-03-18 MED ORDER — SODIUM CHLORIDE 0.9 % IR SOLN
Status: DC | PRN
  Administered 2015-03-18: 16:00:00

## 2015-03-18 MED ORDER — MENTHOL 3 MG MT LOZG: 1.0000 | LOZENGE | OROMUCOSAL | Status: DC | PRN

## 2015-03-18 MED ORDER — CEFAZOLIN SODIUM-DEXTROSE 2-3 GM-% IV SOLR
2.0000 g | Freq: Three times a day (TID) | INTRAVENOUS | Status: AC
  Administered 2015-03-18: 2 g via INTRAVENOUS
  Filled 2015-03-18 (×2): qty 50

## 2015-03-18 MED ORDER — PHENOL 1.4 % MT LIQD: 1.0000 | OROMUCOSAL | Status: DC | PRN

## 2015-03-18 MED ORDER — SIMVASTATIN 40 MG PO TABS
40.0000 mg | ORAL_TABLET | Freq: Every day | ORAL | Status: DC
  Administered 2015-03-18: 40 mg via ORAL
  Filled 2015-03-18 (×2): qty 1

## 2015-03-18 MED ORDER — DOCUSATE SODIUM 100 MG PO CAPS
100.0000 mg | ORAL_CAPSULE | Freq: Two times a day (BID) | ORAL | Status: DC
  Administered 2015-03-18 – 2015-03-19 (×2): 100 mg via ORAL
  Filled 2015-03-18 (×2): qty 1

## 2015-03-18 MED ORDER — HEMOSTATIC AGENTS (NO CHARGE) OPTIME
TOPICAL | Status: DC | PRN
  Administered 2015-03-18: 1 via TOPICAL

## 2015-03-18 MED ORDER — BACITRACIN ZINC 500 UNIT/GM EX OINT
TOPICAL_OINTMENT | CUTANEOUS | Status: DC | PRN
  Administered 2015-03-18: 1 via TOPICAL

## 2015-03-18 MED ORDER — TRAZODONE HCL 50 MG PO TABS
50.0000 mg | ORAL_TABLET | Freq: Every evening | ORAL | Status: DC | PRN
  Filled 2015-03-18: qty 3

## 2015-03-18 MED ORDER — FENTANYL CITRATE (PF) 100 MCG/2ML IJ SOLN
INTRAMUSCULAR | Status: AC
  Filled 2015-03-18: qty 2

## 2015-03-18 MED ORDER — HYDROCODONE-ACETAMINOPHEN 5-325 MG PO TABS
1.0000 | ORAL_TABLET | ORAL | Status: DC | PRN
  Administered 2015-03-19: 2 via ORAL
  Filled 2015-03-18: qty 2

## 2015-03-18 MED ORDER — ONDANSETRON HCL 4 MG/2ML IJ SOLN
INTRAMUSCULAR | Status: DC | PRN
  Administered 2015-03-18: 4 mg via INTRAVENOUS

## 2015-03-18 MED ORDER — HYDROMORPHONE HCL 1 MG/ML IJ SOLN
1.0000 mg | INTRAMUSCULAR | Status: DC | PRN
  Administered 2015-03-18: 1 mg via INTRAVENOUS
  Filled 2015-03-18: qty 1

## 2015-03-18 MED ORDER — LACTATED RINGERS IV SOLN
INTRAVENOUS | Status: DC
  Administered 2015-03-18: 12:00:00 via INTRAVENOUS

## 2015-03-18 MED ORDER — PROPOFOL 10 MG/ML IV BOLUS
INTRAVENOUS | Status: AC
  Filled 2015-03-18: qty 20

## 2015-03-18 MED ORDER — ACETAMINOPHEN 325 MG PO TABS
325.0000 mg | ORAL_TABLET | ORAL | Status: DC | PRN
Start: 2015-03-18 — End: 2015-03-18

## 2015-03-18 MED ORDER — NEOSTIGMINE METHYLSULFATE 10 MG/10ML IV SOLN
INTRAVENOUS | Status: DC | PRN
Start: 2015-03-18 — End: 2015-03-18
  Administered 2015-03-18: 4 mg via INTRAVENOUS

## 2015-03-18 MED ORDER — OXYCODONE HCL 5 MG PO TABS
5.0000 mg | ORAL_TABLET | Freq: Once | ORAL | Status: AC | PRN
  Administered 2015-03-18: 5 mg via ORAL

## 2015-03-18 MED ORDER — LACTATED RINGERS IV SOLN
INTRAVENOUS | Status: DC | PRN
  Administered 2015-03-18 (×2): via INTRAVENOUS

## 2015-03-18 SURGICAL SUPPLY — 47 items
BAG DECANTER FOR FLEXI CONT (MISCELLANEOUS) ×3 IMPLANT
BENZOIN TINCTURE PRP APPL 2/3 (GAUZE/BANDAGES/DRESSINGS) ×3 IMPLANT
BLADE CLIPPER SURG (BLADE) IMPLANT
BRUSH SCRUB EZ PLAIN DRY (MISCELLANEOUS) ×3 IMPLANT
BUR MATCHSTICK NEURO 3.0 LAGG (BURR) ×3 IMPLANT
BUR PRECISION FLUTE 6.0 (BURR) ×3 IMPLANT
CANISTER SUCT 3000ML PPV (MISCELLANEOUS) ×3 IMPLANT
CLOSURE WOUND 1/2 X4 (GAUZE/BANDAGES/DRESSINGS) ×1
DRAPE LAPAROTOMY 100X72X124 (DRAPES) ×3 IMPLANT
DRAPE MICROSCOPE LEICA (MISCELLANEOUS) ×3 IMPLANT
DRAPE POUCH INSTRU U-SHP 10X18 (DRAPES) ×3 IMPLANT
DRAPE SURG 17X23 STRL (DRAPES) ×12 IMPLANT
ELECT BLADE 4.0 EZ CLEAN MEGAD (MISCELLANEOUS) ×3
ELECT REM PT RETURN 9FT ADLT (ELECTROSURGICAL) ×3
ELECTRODE BLDE 4.0 EZ CLN MEGD (MISCELLANEOUS) ×1 IMPLANT
ELECTRODE REM PT RTRN 9FT ADLT (ELECTROSURGICAL) ×1 IMPLANT
GAUZE SPONGE 4X4 12PLY STRL (GAUZE/BANDAGES/DRESSINGS) ×3 IMPLANT
GAUZE SPONGE 4X4 16PLY XRAY LF (GAUZE/BANDAGES/DRESSINGS) IMPLANT
GLOVE BIO SURGEON STRL SZ8 (GLOVE) ×3 IMPLANT
GLOVE BIO SURGEON STRL SZ8.5 (GLOVE) ×3 IMPLANT
GLOVE EXAM NITRILE LRG STRL (GLOVE) IMPLANT
GLOVE EXAM NITRILE MD LF STRL (GLOVE) IMPLANT
GLOVE EXAM NITRILE XL STR (GLOVE) IMPLANT
GLOVE EXAM NITRILE XS STR PU (GLOVE) IMPLANT
GOWN STRL REUS W/ TWL LRG LVL3 (GOWN DISPOSABLE) IMPLANT
GOWN STRL REUS W/ TWL XL LVL3 (GOWN DISPOSABLE) ×1 IMPLANT
GOWN STRL REUS W/TWL 2XL LVL3 (GOWN DISPOSABLE) IMPLANT
GOWN STRL REUS W/TWL LRG LVL3 (GOWN DISPOSABLE)
GOWN STRL REUS W/TWL XL LVL3 (GOWN DISPOSABLE) ×2
KIT BASIN OR (CUSTOM PROCEDURE TRAY) ×3 IMPLANT
KIT ROOM TURNOVER OR (KITS) ×3 IMPLANT
NEEDLE HYPO 21X1.5 SAFETY (NEEDLE) IMPLANT
NEEDLE HYPO 22GX1.5 SAFETY (NEEDLE) ×6 IMPLANT
NS IRRIG 1000ML POUR BTL (IV SOLUTION) ×3 IMPLANT
PACK LAMINECTOMY NEURO (CUSTOM PROCEDURE TRAY) ×3 IMPLANT
PAD ARMBOARD 7.5X6 YLW CONV (MISCELLANEOUS) ×9 IMPLANT
PATTIES SURGICAL .5 X1 (DISPOSABLE) IMPLANT
RUBBERBAND STERILE (MISCELLANEOUS) ×6 IMPLANT
SPONGE SURGIFOAM ABS GEL SZ50 (HEMOSTASIS) ×3 IMPLANT
STRIP CLOSURE SKIN 1/2X4 (GAUZE/BANDAGES/DRESSINGS) ×2 IMPLANT
SUT VIC AB 1 CT1 18XBRD ANBCTR (SUTURE) ×1 IMPLANT
SUT VIC AB 1 CT1 8-18 (SUTURE) ×2
SUT VIC AB 2-0 CP2 18 (SUTURE) ×3 IMPLANT
TAPE CLOTH SURG 4X10 WHT LF (GAUZE/BANDAGES/DRESSINGS) ×3 IMPLANT
TOWEL OR 17X24 6PK STRL BLUE (TOWEL DISPOSABLE) ×3 IMPLANT
TOWEL OR 17X26 10 PK STRL BLUE (TOWEL DISPOSABLE) ×3 IMPLANT
WATER STERILE IRR 1000ML POUR (IV SOLUTION) ×3 IMPLANT

## 2015-03-18 NOTE — Progress Notes (Signed)
Patient c/o too much pressure in his bladder and have to strain to void. PVR ,  IN&OUT cath done at 2100 with clear yellow urine output.At 2215 Patient once again c/o of too much pressure in his bladder after forcing himself to void with output, PVR . Patient was anxious and irritable and wanted something to be done "now", patient won't let RN to explain/educate. Foley cath inserted per protocol, patient c/o of burning sensation with the catheter in place, explained to patient that it is probably due to irritation, patient was not happy but agreed to leave foley cath until tomorrow morning. Will continue to monitor.

## 2015-03-18 NOTE — Progress Notes (Signed)
Subjective:  The patient is somnolent but arousable. He is in no apparent distress. He looks well.  Objective: Vital signs in last 24 hours: Pulse Rate:  [83] 83 (08/31 1026) Resp:  [20] 20 (08/31 1026) BP: (136)/(81) 136/81 mmHg (08/31 1026) SpO2:  [98 %] 98 % (08/31 1026) Weight:  [89.812 kg (198 lb)] 89.812 kg (198 lb) (08/31 1138)  Intake/Output from previous day:   Intake/Output this shift: Total I/O In: 1000 [I.V.:1000] Out: -   Physical exam the patient is somewhat but arousable. He is moving his lower extremities well.  Lab Results:  Recent Labs  03/18/15 1130  WBC 10.4  HGB 13.7  HCT 40.5  PLT 265   BMET No results for input(s): NA, K, CL, CO2, GLUCOSE, BUN, CREATININE, CALCIUM in the last 72 hours.  Studies/Results: Dg Lumbar Spine 1 View  03/18/2015   CLINICAL DATA:  L4-5 microdiscectomy  EXAM: LUMBAR SPINE - 1 VIEW  COMPARISON:  None.  FINDINGS: Intraoperative cross-table lateral radiograph of the lumbar spine. Posterior tissue retractors are noted with a metallic probe with the tip projecting over the inferior articulating facet of L5.  Degenerative disc disease throughout the lumbar spine.  IMPRESSION: Intraoperative localization radiograph.   Electronically Signed   By: Elige Ko   On: 03/18/2015 16:42    Assessment/Plan: The patient is doing well.      Halbert Jesson D 03/18/2015, 5:15 PM

## 2015-03-18 NOTE — Anesthesia Procedure Notes (Signed)
Procedure Name: Intubation Date/Time: 03/18/2015 3:33 PM Performed by: Eligha Bridegroom Pre-anesthesia Checklist: Emergency Drugs available, Timeout performed, Patient identified, Suction available and Patient being monitored Patient Re-evaluated:Patient Re-evaluated prior to inductionOxygen Delivery Method: Circle system utilized Preoxygenation: Pre-oxygenation with 100% oxygen Intubation Type: IV induction Ventilation: Mask ventilation without difficulty and Oral airway inserted - appropriate to patient size Laryngoscope Size: Mac and 4 Grade View: Grade II Tube type: Oral Tube size: 7.5 mm Number of attempts: 1 Airway Equipment and Method: Stylet and LTA kit utilized Placement Confirmation: ETT inserted through vocal cords under direct vision,  breath sounds checked- equal and bilateral and positive ETCO2 Secured at: 22 cm Tube secured with: Tape Dental Injury: Teeth and Oropharynx as per pre-operative assessment

## 2015-03-18 NOTE — Op Note (Signed)
Brief history: The patient is a 59 year old white male who has a history of chronic back pain. Recently he has developed severe right leg pain. He failed medical management and was worked up with a lumbar MRI. This demonstrated a herniated disc at L4-5 on the right. I discussed the various treatment options with the patient including surgery. He has weighed the risks, benefits, and alternative surgery and decided proceed with a right L4-5 discectomy.  Preoperative diagnosis: Right L4-5 herniated disc, lumbago, lumbar radiculopathy  Postoperative diagnosis: The same  Procedure: Right L4-5 Intervertebral discectomy using micro-dissection  Surgeon: Dr. Delma Officer  Asst.: None  Anesthesia: Gen. endotracheal  Estimated blood loss: Minimal  Drains: None  Complications: None  Description of procedure: The patient was brought to the operating room by the anesthesia team. General endotracheal anesthesia was induced. The patient was turned to the prone position on the Wilson frame. The patient's lumbosacral region was then prepared with Betadine scrub and Betadine solution. Sterile drapes were applied.  I then injected the area to be incised with Marcaine with epinephrine solution. I then used a scalpel to make a linear midline incision over the L4-5 intervertebral disc space. I then used electrocautery to perform a right sided subperiosteal dissection exposing the spinous process and lamina of L4 and L5. We obtained intraoperative radiograph to confirm our location. I then inserted the [facility] retractor for exposure.  We then brought the operative microscope into the field. Under its magnification and illumination we completed the microdissection. I used a high-speed drill to perform a laminotomy at L4 on the right. I then used a Kerrison punches to widen the laminotomy at L4 and to remove the cephalad aspect of the L5 lamina and removed the ligamentum flavum at L4-5. We then used microdissection  to free up the thecal sac and the right L5 nerve root from the epidural tissue. I then used a Kerrison punch to perform a foraminotomy at about the L5 nerve root. We then using the nerve root retractor to gently retract the thecal sac and the right L5 nerve root medially. This exposed the intervertebral disc. We identified the ruptured disc and remove it with the pituitary forceps. I inspected the interview of disc at L4-5. It was bulging but there were no significant herniations that this was quite calcified. We did not perform an intervertebral discectomy.  I then palpated along the ventral surface of the thecal sac and along exit route of the right L5 nerve root and noted that the neural structures were well decompressed. This completed the decompression.  We then obtained hemostasis using bipolar electrocautery. We irrigated the wound out with bacitracin solution. We then removed the retractor. We then reapproximated the patient's thoracolumbar fascia with interrupted #1 Vicryl suture. We then reapproximated the patient's subcutaneous tissue with interrupted 2-0 Vicryl suture. We then reapproximated patient's skin with Steri-Strips and benzoin. The was then coated with bacitracin ointment. The drapes were removed. The patient was subsequently returned to the supine position where they were extubated by the anesthesia team. The patient was then transported to the postanesthesia care unit in stable condition. All sponge instrument and needle counts were reportedly correct at the end of this case.

## 2015-03-18 NOTE — Progress Notes (Signed)
Patient states does not have any family or friends available to drive him home after discharge, notified Dr. Lovell Sheehan, MD aware.

## 2015-03-18 NOTE — Plan of Care (Signed)
Problem: Consults Goal: Diagnosis - Spinal Surgery Outcome: Completed/Met Date Met:  03/18/15 Microdiscectomy

## 2015-03-18 NOTE — Anesthesia Preprocedure Evaluation (Signed)
Anesthesia Evaluation  Patient identified by MRN, date of birth, ID band Patient awake    Reviewed: Allergy & Precautions, NPO status , Patient's Chart, lab work & pertinent test results  History of Anesthesia Complications Negative for: history of anesthetic complications  Airway Mallampati: II  TM Distance: >3 FB Neck ROM: Full    Dental  (+) Partial Upper, Missing   Pulmonary neg sleep apnea, neg COPDneg recent URI, former smoker,  breath sounds clear to auscultation        Cardiovascular hypertension, Pt. on medications and Pt. on home beta blockers - angina- Past MI and - CHF Rhythm:Regular     Neuro/Psych  Headaches, PSYCHIATRIC DISORDERS Anxiety Depression he3rniated disk with right leg symptoms     GI/Hepatic Neg liver ROS, GERD-  Medicated and Controlled,  Endo/Other  negative endocrine ROS  Renal/GU negative Renal ROS     Musculoskeletal  (+) Arthritis -,   Abdominal   Peds  Hematology negative hematology ROS (+)   Anesthesia Other Findings   Reproductive/Obstetrics                             Anesthesia Physical Anesthesia Plan  ASA: III  Anesthesia Plan: General   Post-op Pain Management:    Induction: Intravenous  Airway Management Planned: Oral ETT  Additional Equipment: None  Intra-op Plan:   Post-operative Plan: Extubation in OR  Informed Consent: I have reviewed the patients History and Physical, chart, labs and discussed the procedure including the risks, benefits and alternatives for the proposed anesthesia with the patient or authorized representative who has indicated his/her understanding and acceptance.   Dental advisory given  Plan Discussed with: CRNA and Surgeon  Anesthesia Plan Comments:         Anesthesia Quick Evaluation

## 2015-03-18 NOTE — H&P (Signed)
Subjective: Dylan [name] is a 59 year old white male who's had chronic back pain. He easily developed right leg pain. He failed medical management and was worked up with a lumbar MRI which demonstrated a herniated disc at L4-5 on the right. I discussed the various treatment options with the patient including surgery. He has weighed the risks, benefits, and alternative surgery and decided proceed with a right L4-5 discectomy.   Past Medical History  Diagnosis Date  . Hyperlipidemia   . Anxiety   . Depression   . Hypertension   . History of chicken pox   . History of mumps as a child   . History of measles as a child   . Shortness of breath dyspnea     with exertion ? d/t stress & anxiety  . GERD (gastroesophageal reflux disease)     uses zantac periodically  . Seizures     blacked out in shower when coming off benzo - 'withdrawals'  . Arthritis     in back    Past Surgical History  Procedure Laterality Date  . Lung surgery Left 2009    Lung Collapse: left chest tube  . Rotator cuff repair  2004    had rotator cuff injury and surgery was performed  . Knee surgery Right 1993  . Tonsillectomy and adenoidectomy  1960  . Mri  01/08/2014    Lumbar spine: Moderate disc degeneration and facet arthrosis throughout the lumbar spine with mild spinal stenosis of L3-L4, L5-S1, and multilevel moderate to severe lateral recess stenosis  . Back surgery    . Tonsillectomy      Allergies  Allergen Reactions  . Belsomra  [Suvorexant]     Hallucinations  . Duloxetine     Nervous  . Meloxicam     Stomach cramps  . Morphine Other (See Comments)    Social History  Substance Use Topics  . Smoking status: Former Smoker -- 1.00 packs/day for 30 years    Types: Cigarettes    Quit date: 07/18/2002  . Smokeless tobacco: Not on file  . Alcohol Use: No    Family History  Problem Relation Age of Onset  . Alzheimer's disease Mother   . Stroke Mother   . Heart disease Father   . Congestive Heart  Failure Brother   . CAD Brother   . Congestive Heart Failure Sister    Prior to Admission medications   Medication Sig Start Date End Date Taking? Authorizing Provider  LORazepam (ATIVAN) 1 MG tablet Take 1 tablet (1 mg total) by mouth 2 (two) times daily. 01/23/15  Yes Malva Limes, MD  methocarbamol (ROBAXIN-750) 750 MG tablet Take 1 tablet (750 mg total) by mouth 4 (four) times daily. 02/26/15  Yes Malva Limes, MD  metoprolol tartrate (LOPRESSOR) 25 MG tablet TAKE 1 TABLET TWICE A DAY 02/23/15  Yes Malva Limes, MD  oxyCODONE-acetaminophen (PERCOCET) 10-325 MG per tablet Take 1 tablet by mouth every 4 (four) hours as needed for pain.  03/02/15  Yes Historical Provider, MD  predniSONE (DELTASONE) 20 MG tablet Take 3 tablets a day for 4 days, then 2 tablets a day for 4 days, then 1 tablet a day 02/25/15  Yes Malva Limes, MD  ranitidine (ZANTAC) 150 MG tablet Take 150 mg by mouth daily as needed for heartburn.   Yes Historical Provider, MD  simvastatin (ZOCOR) 40 MG tablet Take 40 mg by mouth daily.  10/07/14  Yes Historical Provider, MD  traZODone (DESYREL) 50 MG  tablet Take 50-150 mg by mouth at bedtime as needed for sleep.   Yes Historical Provider, MD  zolpidem (AMBIEN CR) 12.5 MG CR tablet Take 1 tablet (12.5 mg total) by mouth at bedtime as needed for sleep. 03/10/15  Yes Malva Limes, MD  AMOXICILLIN PO Take 1 capsule by mouth as needed (tooth ache).    Historical Provider, MD  aspirin 81 MG tablet Take 81 mg by mouth daily.  01/24/08   Historical Provider, MD  diphenhydramine-acetaminophen (TYLENOL PM) 25-500 MG TABS Take 1 tablet by mouth at bedtime as needed (pain).    Historical Provider, MD  HYDROcodone-acetaminophen [facility]) 7.5-325 MG per tablet 1-2 every four hours as needed Patient not taking: Reported on 03/11/2015 02/23/15   Malva Limes, MD  Triamcinolone Acetonide (NASACORT AQ NA) Place 1 spray into the nose daily as needed (allergies).    Historical Provider, MD      Review of Systems  Positive ROS: As above  All other systems have been reviewed and were otherwise negative with the exception of those mentioned in the HPI and as above.  Objective: Vital signs in last 24 hours: Pulse Rate:  [83] 83 (08/31 1026) Resp:  [20] 20 (08/31 1026) BP: (136)/(81) 136/81 mmHg (08/31 1026) SpO2:  [98 %] 98 % (08/31 1026) Weight:  [89.812 kg (198 lb)] 89.812 kg (198 lb) (08/31 1138)  General Appearance: Alert, cooperative, no distress, Head: Normocephalic, without obvious abnormality, atraumatic Eyes: PERRL, conjunctiva/corneas clear, EOM's intact,    Ears: Normal  Throat: Normal  Neck: Supple, symmetrical, trachea midline, no adenopathy; thyroid: No enlargement/tenderness/nodules; no carotid bruit or JVD Back: Symmetric, no curvature, ROM normal, no CVA tenderness Lungs: Clear to auscultation bilaterally, respirations unlabored Heart: Regular rate and rhythm, no murmur, rub or gallop Abdomen: Soft, non-tender,, no masses, no organomegaly Extremities: Extremities normal, atraumatic, no cyanosis or edema Pulses: 2+ and symmetric all extremities Skin: Skin color, texture, turgor normal, no rashes or lesions  NEUROLOGIC:   Mental status: alert and oriented, no aphasia, good attention span, Fund of knowledge/ memory ok Motor Exam - grossly normal except his right EHL/dorsiflexion is weak Sensory Exam - grossly normal Reflexes:  Coordination - grossly normal Gait - grossly normal Balance - grossly normal Cranial Nerves: I: smell Not tested  II: visual acuity  OS: Normal  OD: Normal   II: visual fields Full to confrontation  II: pupils Equal, round, reactive to light  III,VII: ptosis None  III,IV,VI: extraocular muscles  Full ROM  V: mastication Normal  V: facial light touch sensation  Normal  V,VII: corneal reflex  Present  VII: facial muscle function - upper  Normal  VII: facial muscle function - lower Normal  VIII: hearing Not tested  IX: soft  palate elevation  Normal  IX,X: gag reflex Present  XI: trapezius strength  5/5  XI: sternocleidomastoid strength 5/5  XI: neck flexion strength  5/5  XII: tongue strength  Normal    Data Review Lab Results  Component Value Date   WBC 10.4 03/18/2015   HGB 13.7 03/18/2015   HCT 40.5 03/18/2015   MCV 93.5 03/18/2015   PLT 265 03/18/2015   Lab Results  Component Value Date   NA 136 03/11/2015   K 4.2 03/11/2015   CL 101 03/11/2015   CO2 24 03/11/2015   BUN 19 03/11/2015   CREATININE 1.00 03/11/2015   GLUCOSE 125* 03/11/2015   No results found for: INR, PROTIME  Assessment/Plan: Right L4-5 herniated disc, lumbago,  lumbar radiculopathy: I have discussed situation with the patient. I have reviewed his MRI scan with him and pointed out the abnormalities. We have discussed the various treatment options including surgery. I have described the surgical treatment option of right L4-5 discectomy. I have shown him surgical models. We have discussed the risks, benefits alternatives, and likelihood of achieving our goals with surgery. I have answered all the patient's questions. He has decided to proceed with surgery.   Lorine Iannaccone D 03/18/2015 2:29 PM

## 2015-03-18 NOTE — Transfer of Care (Signed)
Immediate Anesthesia Transfer of Care Note  Patient: Dylan [name]  Procedure(s) Performed: Procedure(s) with comments: Right Lumbar Four-Five Microdiscectomy (Right) - Right L45 microdiskectomy  Patient Location: PACU  Anesthesia Type:General  Level of Consciousness: awake and alert   Airway & Oxygen Therapy: Patient Spontanous Breathing and Patient connected to nasal cannula oxygen  Post-op Assessment: Report given to RN and Post -op Vital signs reviewed and stable  Post vital signs: Reviewed and stable  Last Vitals:  Filed Vitals:   03/18/15 1026  BP: 136/81  Pulse: 83  Resp: 20    Complications: No apparent anesthesia complications

## 2015-03-18 NOTE — Anesthesia Postprocedure Evaluation (Signed)
  Anesthesia Post-op Note  Patient: Dylan [name]  Procedure(s) Performed: Procedure(s) with comments: Right Lumbar Four-Five Microdiscectomy (Right) - Right L45 microdiskectomy  Patient Location: PACU  Anesthesia Type:General  Level of Consciousness: sedated and responds to stimulation  Airway and Oxygen Therapy: Patient Spontanous Breathing and Patient connected to nasal cannula oxygen  Post-op Pain: mild  Post-op Assessment: Post-op Vital signs reviewed, Patient's Cardiovascular Status Stable, Respiratory Function Stable, Patent Airway, No signs of Nausea or vomiting and Pain level controlled LLE Motor Response: Purposeful movement, Responds to commands LLE Sensation: No numbness RLE Motor Response: Purposeful movement, Responds to commands RLE Sensation: Numbness      Post-op Vital Signs: Reviewed and stable  Last Vitals:  Filed Vitals:   03/18/15 1825  BP:   Pulse: 65  Temp:   Resp: 13    Complications: No apparent anesthesia complications

## 2015-03-19 ENCOUNTER — Encounter: Payer: Self-pay | Admitting: Family Medicine

## 2015-03-19 DIAGNOSIS — M5116 Intervertebral disc disorders with radiculopathy, lumbar region: Secondary | ICD-10-CM | POA: Diagnosis not present

## 2015-03-19 MED ORDER — CYCLOBENZAPRINE HCL 10 MG PO TABS: 10.0000 mg | ORAL_TABLET | Freq: Three times a day (TID) | ORAL | Status: DC | PRN

## 2015-03-19 MED ORDER — TAMSULOSIN HCL 0.4 MG PO CAPS: 0.4000 mg | ORAL_CAPSULE | Freq: Every day | ORAL | Status: DC

## 2015-03-19 MED ORDER — OXYCODONE-ACETAMINOPHEN 10-325 MG PO TABS: 1.0000 | ORAL_TABLET | ORAL | Status: DC | PRN

## 2015-03-19 MED ORDER — DOCUSATE SODIUM 100 MG PO CAPS: 100.0000 mg | ORAL_CAPSULE | Freq: Two times a day (BID) | ORAL | Status: DC

## 2015-03-19 NOTE — Evaluation (Signed)
Physical Therapy Evaluation Patient Details Name: Dylan [name] MRN: [id number] DOB: Jan 05, 1956 Today's Date: 03/19/2015   History of Present Illness  Admitted for L4-5 microdiscectomy ;  has a past medical history of Hyperlipidemia; Anxiety; Depression; Hypertension; History of chicken pox; History of mumps as a child; History of measles as a child; Shortness of breath dyspnea; GERD (gastroesophageal reflux disease); Seizures; and Arthritis.  Clinical Impression  Patient is s/p above surgery resulting in functional limitations due to the deficits listed below (see PT Problem List).  Patient will benefit from skilled PT to increase their independence and safety with mobility to allow discharge to the venue listed below.       Follow Up Recommendations Home health PT    Equipment Recommendations  Rolling walker with 5" wheels;3in1 (PT)    Recommendations for Other Services OT consult     Precautions / Restrictions Precautions Precautions: Fall Restrictions Weight Bearing Restrictions: No      Mobility  Bed Mobility Overal bed mobility: Needs Assistance Bed Mobility: Rolling;Sidelying to Sit Rolling: Supervision Sidelying to sit: Supervision       General bed mobility comments: Cues for technique; slow moving and painful  Transfers Overall transfer level: Needs assistance Equipment used: None Transfers: Sit to/from Stand Sit to Stand: Min guard (without phsyical contact)         General transfer comment: Cues for back precautions for comfort; Noted heavy dependence on UE support  Ambulation/Gait Ambulation/Gait assistance: Min assist;Min guard;Supervision Ambulation Distance (Feet): 200 Feet Assistive device: Straight cane;Rolling walker (2 wheeled) Gait Pattern/deviations: Step-through pattern;Narrow base of support;Decreased stride length     General Gait Details: Very unsteady walking; Initially used a cane for gait training, but still noted pt's tendency to reach  out for UE support with free hand, and at imes erratic step width; Second part of walk was with RW and pt was clearly more steady with the bigger base of support the rW offers (he remains hesitant to use it)  Stairs Stairs: Yes Stairs assistance: Min assist Stair Management: One rail Right;With cane;Step to pattern;Forwards Number of Stairs: 5 General stair comments: Cues for sequence and technique; min asssit for suport due to unsteadiness  Wheelchair Mobility    Modified Rankin (Stroke Patients Only)       Balance Overall balance assessment: History of Falls;Needs assistance           Standing balance-Leahy Scale: Fair                               Pertinent Vitals/Pain Pain Assessment: 0-10 Pain Score: 6  Pain Location: back, and some at his Right ankle Pain Descriptors / Indicators: Aching Pain Intervention(s): Limited activity within patient's tolerance;Monitored during session;Repositioned    Home Living Family/patient expects to be discharged to:: Private residence Living Arrangements: Alone Available Help at Discharge: Other (Comment) (son can help some; per pt, not reliably) Type of Home: House Home Access: Stairs to enter Entrance Stairs-Rails: Right;Left (Pt states they are not reliable for support) Entrance Stairs-Number of Steps: 5 Home Layout: One level Home Equipment: None      Prior Function Level of Independence: Independent         Comments: Used a cane at one point, but it snapped in two when he fell down his steps     Hand Dominance        Extremity/Trunk Assessment   Upper Extremity Assessment: Overall WFL for tasks assessed  Lower Extremity Assessment: Generalized weakness (noting heavy use of UE support)         Communication   Communication: No difficulties  Cognition Arousal/Alertness: Awake/alert Behavior During Therapy: WFL for tasks assessed/performed Overall Cognitive Status: Within  Functional Limits for tasks assessed                      General Comments      Exercises        Assessment/Plan    PT Assessment Patient needs continued PT services  PT Diagnosis Difficulty walking;Acute pain   PT Problem List Decreased strength;Decreased activity tolerance;Decreased balance;Decreased mobility;Decreased knowledge of use of DME;Decreased safety awareness;Decreased knowledge of precautions;Pain  PT Treatment Interventions DME instruction;Gait training;Stair training;Functional mobility training;Therapeutic activities;Therapeutic exercise;Patient/family education   PT Goals (Current goals can be found in the Care Plan section) Acute Rehab PT Goals Patient Stated Goal: less pain PT Goal Formulation: With patient Time For Goal Achievement: 03/26/15 Potential to Achieve Goals: Good    Frequency Min 5X/week   Barriers to discharge Decreased caregiver support Lives alone, and son can't give reliable help    Co-evaluation               End of Session   Activity Tolerance: Patient tolerated treatment well Patient left: in bed;with call bell/phone within reach Nurse Communication: Mobility status;Other (comment) (Fall risk, need for use of RW)    Functional Assessment Tool Used: Clinical Judgement Functional Limitation: Mobility: Walking and moving around Mobility: Walking and Moving Around Current Status [phone]): At least 1 percent but less than 20 percent impaired, limited or restricted Mobility: Walking and Moving Around Goal Status [phone]): 0 percent impaired, limited or restricted    Time: 0940-1019 (minus 5 minutes to get cane) PT Time Calculation (min) (ACUTE ONLY): 39 min   Charges:   PT Evaluation $Initial PT Evaluation Tier I: 1 Procedure PT Treatments $Gait Training: 8-22 mins   PT G Codes:   PT G-Codes **NOT FOR INPATIENT CLASS** Functional Assessment Tool Used: Clinical Judgement Functional Limitation: Mobility: Walking and  moving around Mobility: Walking and Moving Around Current Status (U9811): At least 1 percent but less than 20 percent impaired, limited or restricted Mobility: Walking and Moving Around Goal Status [phone]): 0 percent impaired, limited or restricted    Van Clines Hamff 03/19/2015, 11:19 AM  Van Clines, PT  Acute Rehabilitation Services Pager [phone] Office [phone]

## 2015-03-19 NOTE — Care Management Note (Signed)
Case Management Note  Patient Details  Name: Dylan [name] MRN: [id number] Date of Birth: [birth year]  Subjective/Objective:   59 yr old male s/p L4-5 microdiscectomy                 Action/Plan: Case manager spoke with patient concerning home health and DME needs. Choice offered. Referral called to [facility] Liaison.   Expected Discharge Date:   03/19/15               Expected Discharge Plan:   home with Home Health   In-House Referral:  NA  Discharge planning Services  CM Consult  Post Acute Care Choice:  Durable Medical Equipment, Home Health Choice offered to:  Patient  DME Arranged:  Walker rolling DME Agency:  [facility]  HH Arranged:  PT [facility] Agency:  [facility]  Status of Service:  Completed, signed off  Medicare Important Message Given:    Date Medicare IM Given:    Medicare IM give by:    Date Additional Medicare IM Given:    Additional Medicare Important Message give by:     If discussed at Long Length of Stay Meetings, dates discussed:    Additional Comments:  Durenda Guthrie, RN 03/19/2015, 11:38 AM

## 2015-03-19 NOTE — Discharge Instructions (Signed)

## 2015-03-19 NOTE — Discharge Summary (Signed)
Physician Discharge Summary  Patient ID: Dylan [name] MRN: [id number] DOB/AGE: 1956-05-18 59 y.o.  Admit date: 03/18/2015 Discharge date: 03/19/2015  Admission Diagnoses: Right L4-5 herniated disc, lumbago, lumbar radiculopathy  Discharge Diagnoses: The same Active Problems:   Lumbar herniated disc   Discharged Condition: good  Hospital Course: I performed a right L4-5 discectomy on the patient on 03/18/2015. The surgery went well.  The patient's postoperative course was unremarkable. On postoperative day #1 the patient requested discharge to home. He was given oral and written discharge instructions. All his questions were answered.  Consults: Physical therapy Significant Diagnostic Studies: None Treatments: Right L4-5 discectomy using microdissection Discharge Exam: Blood pressure 111/66, pulse 77, temperature 98.5 F (36.9 C), temperature source Oral, resp. rate 18, height 5\' 10"  (1.778 m), weight 89.812 kg (198 lb), SpO2 98 %. The patient is alert and pleasant. His strength is normal in his lower extremities. His dressing is clean and dry.  Disposition: Home  Discharge Instructions    Call MD for:  difficulty breathing, headache or visual disturbances    Complete by:  As directed      Call MD for:  extreme fatigue    Complete by:  As directed      Call MD for:  hives    Complete by:  As directed      Call MD for:  persistant dizziness or light-headedness    Complete by:  As directed      Call MD for:  persistant nausea and vomiting    Complete by:  As directed      Call MD for:  redness, tenderness, or signs of infection (pain, swelling, redness, odor or green/yellow discharge around incision site)    Complete by:  As directed      Call MD for:  severe uncontrolled pain    Complete by:  As directed      Call MD for:  temperature >100.4    Complete by:  As directed      Diet - low sodium heart healthy    Complete by:  As directed      Discharge instructions     Complete by:  As directed   Call [phone] for a followup appointment. Take a stool softener while you are using pain medications.     Driving Restrictions    Complete by:  As directed   Do not drive for 2 weeks.     Increase activity slowly    Complete by:  As directed      Lifting restrictions    Complete by:  As directed   Do not lift more than 5 pounds. No excessive bending or twisting.     May shower / Bathe    Complete by:  As directed   He may shower after the pain she is removed 3 days after surgery. Leave the incision alone.     Remove dressing in 48 hours    Complete by:  As directed   Your stitches are under the scan and will dissolve by themselves. The Steri-Strips will fall off after you take a few showers. Do not rub back or pick at the wound, Leave the wound alone.            Medication List    STOP taking these medications        predniSONE 20 MG tablet  Commonly known as:  DELTASONE      TAKE these medications        AMOXICILLIN  PO  Take 1 capsule by mouth as needed (tooth ache).     aspirin 81 MG tablet  Take 81 mg by mouth daily.     cyclobenzaprine 10 MG tablet  Commonly known as:  FLEXERIL  Take 1 tablet (10 mg total) by mouth 3 (three) times daily as needed for muscle spasms.     diphenhydramine-acetaminophen 25-500 MG Tabs  Commonly known as:  TYLENOL PM  Take 1 tablet by mouth at bedtime as needed (pain).     docusate sodium 100 MG capsule  Commonly known as:  COLACE  Take 1 capsule (100 mg total) by mouth 2 (two) times daily.     LORazepam 1 MG tablet  Commonly known as:  ATIVAN  Take 1 tablet (1 mg total) by mouth 2 (two) times daily.     methocarbamol 750 MG tablet  Commonly known as:  ROBAXIN-750  Take 1 tablet (750 mg total) by mouth 4 (four) times daily.     metoprolol tartrate 25 MG tablet  Commonly known as:  LOPRESSOR  TAKE 1 TABLET TWICE A DAY     oxyCODONE-acetaminophen 10-325 MG per tablet  Commonly known as:   PERCOCET  Take 1 tablet by mouth every 4 (four) hours as needed for pain.     oxyCODONE-acetaminophen 10-325 MG per tablet  Commonly known as:  PERCOCET  Take 1 tablet by mouth every 4 (four) hours as needed for pain.     ranitidine 150 MG tablet  Commonly known as:  ZANTAC  Take 150 mg by mouth daily as needed for heartburn.     simvastatin 40 MG tablet  Commonly known as:  ZOCOR  Take 40 mg by mouth daily.     tamsulosin 0.4 MG Caps capsule  Commonly known as:  FLOMAX  Take 1 capsule (0.4 mg total) by mouth daily.      ASK your doctor about these medications        HYDROcodone-acetaminophen 7.5-325 MG per tablet  Commonly known as:  NORCO  1-2 every four hours as needed     NASACORT AQ NA  Place 1 spray into the nose daily as needed (allergies).     traZODone 50 MG tablet  Commonly known as:  DESYREL  Take 50-150 mg by mouth at bedtime as needed for sleep.     zolpidem 12.5 MG CR tablet  Commonly known as:  AMBIEN CR  Take 1 tablet (12.5 mg total) by mouth at bedtime as needed for sleep.           Follow-up Information    Follow up with [facility].   Why:  Someone from [facility] will contact you concerning start date and time for therapy.    Contact information:   [address] [place] [address] [phone]       Signed: Cristi Loron 03/19/2015, 2:39 PM

## 2015-03-19 NOTE — Progress Notes (Signed)
Pt given D/C instructions with Rx's, verbal understanding was provided. Pt's incision is clean and dry with no sign of infection. Pt's IV was removed prior to D/C. Pt refused to take RW home. Pt D/C'd home via wheelchair @ 1500 per D order. Pt is stable @ D/C and has no other needs at this time. Rema Fendt, RN

## 2015-03-24 NOTE — Anesthesia Postprocedure Evaluation (Signed)
  Anesthesia Post-op Note  Patient: Dylan [name]  Procedure(s) Performed: Procedure(s) with comments: Right Lumbar Four-Five Microdiscectomy (Right) - Right L45 microdiskectomy  Patient Location: PACU  Anesthesia Type:General  Level of Consciousness: awake  Airway and Oxygen Therapy: Patient Spontanous Breathing and Patient connected to nasal cannula oxygen  Post-op Pain: mild  Post-op Assessment: Post-op Vital signs reviewed, Patient's Cardiovascular Status Stable, Respiratory Function Stable, Patent Airway, No signs of Nausea or vomiting and Pain level controlled LLE Motor Response: Purposeful movement LLE Sensation: Decreased RLE Motor Response: Purposeful movement RLE Sensation: Numbness, Tingling      Post-op Vital Signs: Reviewed and stable  Last Vitals:  Filed Vitals:   03/19/15 1216  BP: 111/66  Pulse: 77  Temp: 36.9 C  Resp: 18    Complications: No apparent anesthesia complications

## 2015-04-01 ENCOUNTER — Encounter: Payer: Self-pay | Admitting: Family Medicine

## 2015-04-01 ENCOUNTER — Ambulatory Visit (INDEPENDENT_AMBULATORY_CARE_PROVIDER_SITE_OTHER): Payer: No Typology Code available for payment source | Admitting: Family Medicine

## 2015-04-01 VITALS — BP 124/88 | HR 108 | Temp 98.0°F | Resp 16 | Ht 70.0 in | Wt 203.0 lb

## 2015-04-01 DIAGNOSIS — I10 Essential (primary) hypertension: Secondary | ICD-10-CM

## 2015-04-01 DIAGNOSIS — F419 Anxiety disorder, unspecified: Secondary | ICD-10-CM | POA: Diagnosis not present

## 2015-04-01 DIAGNOSIS — Z23 Encounter for immunization: Secondary | ICD-10-CM

## 2015-04-01 DIAGNOSIS — M5126 Other intervertebral disc displacement, lumbar region: Secondary | ICD-10-CM

## 2015-04-01 DIAGNOSIS — Z0000 Encounter for general adult medical examination without abnormal findings: Secondary | ICD-10-CM | POA: Diagnosis not present

## 2015-04-01 DIAGNOSIS — F32A Depression, unspecified: Secondary | ICD-10-CM

## 2015-04-01 DIAGNOSIS — F329 Major depressive disorder, single episode, unspecified: Secondary | ICD-10-CM

## 2015-04-01 MED ORDER — METOPROLOL TARTRATE 25 MG PO TABS: 25.0000 mg | ORAL_TABLET | Freq: Two times a day (BID) | ORAL | Status: DC

## 2015-04-01 NOTE — Progress Notes (Signed)
Patient: Dylan [name], Male    DOB: [birth year], 59 y.o.   MRN: [id number] Visit Date: 04/01/2015  Today's Provider: [name], MD   Chief Complaint  Patient presents with  . Annual Exam  . Hypertension  . Hyperlipidemia  . Anxiety  . Insomnia   Subjective:    Annual physical exam Dylan [name] is a 59 y.o. male who presents today for health maintenance and complete physical. He feels poorly. He reports exercising n/a. He reports he is sleeping well.  -----------------------------------------------------------------    Hypertension, follow-up:  BP Readings from Last 3 Encounters:  04/01/15 124/88  03/19/15 111/66  03/11/15 146/93    He was last seen for hypertension 1 years ago.  BP at that visit was 118/80. Management since that visit includes none .He reports good compliance with treatment. He is not having side effects. none  He is not exercising. He is not adherent to low salt diet.   Outside blood pressures are yes. He is experiencing none.  Patient denies none.   Cardiovascular risk factors include none.  Use of agents associated with hypertension: NSAIDS.   ------------------------------------------------------------------------    Lipid/Cholesterol, Follow-up:   Last seen for this 1 years ago.  Management since that visit includes continuing simvastatin.  Last Lipid Panel:    Component Value Date/Time   CHOL 167 10/08/2014   TRIG 204* 10/08/2014   HDL 45 10/08/2014   LDLCALC 81 10/08/2014    He reports good compliance with treatment. He is not having side effects. none  Wt Readings from Last 3 Encounters:  04/01/15 203 lb (92.08 kg)  03/18/15 198 lb (89.812 kg)  03/11/15 198 lb (89.812 kg)    ------------------------------------------------------------------------  Follow up herniated disk Had surgery Dr. Lovell Sheehan 03/18/2015 and is doing much better. No complications from surgery. Pain medications are working well.    Review of  Systems  Constitutional: Positive for diaphoresis and fatigue. Negative for fever, chills, activity change and appetite change.  HENT: Negative for congestion, ear discharge, ear pain, sinus pressure, sneezing, sore throat, tinnitus and trouble swallowing.   Eyes: Negative for photophobia, pain and redness.  Respiratory: Positive for shortness of breath. Negative for apnea, cough, choking, chest tightness and wheezing.   Cardiovascular: Negative for chest pain.  Gastrointestinal: Negative for nausea, vomiting, abdominal pain, diarrhea, blood in stool, abdominal distention and rectal pain.  Endocrine: Negative for cold intolerance, polydipsia, polyphagia and polyuria.  Genitourinary: Negative for dysuria, urgency, frequency, flank pain, decreased urine volume and testicular pain.  Musculoskeletal: Positive for back pain. Negative for myalgias, joint swelling, neck pain and neck stiffness.  Skin: Negative for pallor and rash.  Neurological: Negative for dizziness, tremors, seizures, syncope, speech difficulty, weakness, light-headedness and numbness.  Hematological: Negative for adenopathy.  Psychiatric/Behavioral: Positive for decreased concentration. Negative for behavioral problems, confusion, sleep disturbance and agitation. The patient is not nervous/anxious and is not hyperactive.     Social History He  reports that he quit smoking about 12 years ago. His smoking use included Cigarettes. He has a 30 pack-year smoking history. He does not have any smokeless tobacco history on file. He reports that he does not drink alcohol or use illicit drugs. Social History   Social History  . Marital Status: Divorced    Spouse Name: N/A  . Number of Children: N/A  . Years of Education: N/A   Social History Main Topics  . Smoking status: Former Smoker -- 1.00 packs/day for 30 years  Types: Cigarettes    Quit date: 07/18/2002  . Smokeless tobacco: None  . Alcohol Use: No  . Drug Use: No  .  Sexual Activity: Not Asked   Other Topics Concern  . None   Social History Narrative    Patient Active Problem List   Diagnosis Date Noted  . Lumbar herniated disc 02/25/2015  . Aspermia 01/12/2015  . Bowel habit changes 01/12/2015  . Back pain, chronic 01/12/2015  . DDD (degenerative disc disease), cervical 01/12/2015  . Depression 01/12/2015  . Erectile dysfunction 01/12/2015  . Fracture of bones of trunk, closed 01/12/2015  . Headache 01/12/2015  . Hemorrhoid 01/12/2015  . History of tobacco use 01/12/2015  . Hypogonadism in male 01/12/2015  . Insomnia 01/12/2015  . Left knee pain 01/12/2015  . Obesity 01/12/2015  . Panic attacks 01/12/2015  . Shortness of breath on exertion 01/12/2015  . Anxiety 02/25/2008  . Hyperlipidemia, mixed 01/30/2008  . Essential (primary) hypertension 01/24/2008  . Fam hx-ischem heart disease 01/24/2008    Past Surgical History  Procedure Laterality Date  . Lung surgery Left 2009    Lung Collapse: left chest tube  . Rotator cuff repair  2004    had rotator cuff injury and surgery was performed  . Knee surgery Right 1993  . Tonsillectomy and adenoidectomy  1960  . Back surgery    . Tonsillectomy    . Lumbar disc surgery  03/18/2015    R L4-5 micro discectomy, Dr. Lovell Sheehan  . Lumbar laminectomy/decompression microdiscectomy Right 03/18/2015    Procedure: Right Lumbar Four-Five Microdiscectomy;  Surgeon: Tressie Stalker, MD;  Location: [facility];  Service: Neurosurgery;  Laterality: Right;  Right L45 microdiskectomy    Family History  Family Status  Relation Status Death Age  . Mother Deceased 1    Cause of Death: CVA with Alzheimers disease  . Father Deceased 33  . Sister Alive   . Brother Alive   . Sister Deceased 71    Cause of death: Congestive Heart Failure   His family history includes Alzheimer's disease in his mother; CAD in his brother; Congestive Heart Failure in his brother and sister; Heart disease in his father;  Stroke in his mother.    Allergies  Allergen Reactions  . Belsomra  [Suvorexant]     Hallucinations  . Duloxetine     Nervous  . Meloxicam     Stomach cramps  . Morphine Other (See Comments)    Previous Medications   AMOXICILLIN PO    Take 1 capsule by mouth as needed (tooth ache).   ASPIRIN 81 MG TABLET    Take 81 mg by mouth daily.    CYCLOBENZAPRINE (FLEXERIL) 10 MG TABLET    Take 1 tablet (10 mg total) by mouth 3 (three) times daily as needed for muscle spasms.   DIPHENHYDRAMINE-ACETAMINOPHEN (TYLENOL PM) 25-500 MG TABS    Take 1 tablet by mouth at bedtime as needed (pain).   DOCUSATE SODIUM (COLACE) 100 MG CAPSULE    Take 1 capsule (100 mg total) by mouth 2 (two) times daily.   HYDROCODONE-ACETAMINOPHEN (NORCO) 7.5-325 MG PER TABLET    1-2 every four hours as needed   LORAZEPAM (ATIVAN) 1 MG TABLET    Take 1 tablet (1 mg total) by mouth 2 (two) times daily.   METHOCARBAMOL (ROBAXIN-750) 750 MG TABLET    Take 1 tablet (750 mg total) by mouth 4 (four) times daily.   METOPROLOL TARTRATE (LOPRESSOR) 25 MG TABLET  TAKE 1 TABLET TWICE A DAY   OXYCODONE-ACETAMINOPHEN (PERCOCET) 10-325 MG PER TABLET    Take 1 tablet by mouth every 4 (four) hours as needed for pain.    OXYCODONE-ACETAMINOPHEN (PERCOCET) 10-325 MG PER TABLET    Take 1 tablet by mouth every 4 (four) hours as needed for pain.   RANITIDINE (ZANTAC) 150 MG TABLET    Take 150 mg by mouth daily as needed for heartburn.   SIMVASTATIN (ZOCOR) 40 MG TABLET    Take 40 mg by mouth daily.    TAMSULOSIN (FLOMAX) 0.4 MG CAPS CAPSULE    Take 1 capsule (0.4 mg total) by mouth daily.   TRAZODONE (DESYREL) 50 MG TABLET    Take 50-150 mg by mouth at bedtime as needed for sleep.   TRIAMCINOLONE ACETONIDE (NASACORT AQ NA)    Place 1 spray into the nose daily as needed (allergies).   ZOLPIDEM (AMBIEN CR) 12.5 MG CR TABLET    Take 1 tablet (12.5 mg total) by mouth at bedtime as needed for sleep.    Patient Care Team: Malva Limes, MD as  PCP - General (Family Medicine)     Objective:   Vitals: BP 124/88 mmHg  Pulse 108  Temp(Src) 98 F (36.7 C) (Oral)  Resp 16  Ht 5\' 10"  (1.778 m)  Wt 203 lb (92.08 kg)  BMI 29.13 kg/m2   Physical Exam   General Appearance:    Alert, cooperative, no distress, appears stated age  Head:    Normocephalic, without obvious abnormality, atraumatic  Eyes:    PERRL, conjunctiva/corneas clear, EOM's intact, fundi    benign, both eyes       Ears:    Normal TM's and external ear canals, both ears  Nose:   Nares normal, septum midline, mucosa normal, no drainage   or sinus tenderness  Throat:   Lips, mucosa, and tongue normal; teeth and gums normal  Neck:   Supple, symmetrical, trachea midline, no adenopathy;       thyroid:  No enlargement/tenderness/nodules; no carotid   bruit or JVD  Back:     Symmetric, no curvature, ROM normal, no CVA tenderness, surgical scar clean and dry, no erythema.   Lungs:     Clear to auscultation bilaterally, respirations unlabored  Chest wall:    No tenderness or deformity  Heart:    Regular rate and rhythm, S1 and S2 normal, no murmur, rub   or gallop  Abdomen:     Soft, non-tender, bowel sounds active all four quadrants,    no masses, no organomegaly  Genitalia:    deferred  Rectal:    deferred  Extremities:   Extremities normal, atraumatic, no cyanosis or edema  Pulses:   2+ and symmetric all extremities  Skin:   Skin color, texture, turgor normal, no rashes or lesions  Lymph nodes:   Cervical, supraclavicular, and axillary nodes normal  Neurologic:   CNII-XII intact. Normal strength, sensation and reflexes      throughout    Depression Screen PHQ 2/9 Scores 04/01/2015  PHQ - 2 Score 5  PHQ- 9 Score 20      Assessment & Plan:     Routine Health Maintenance and Physical Exam  Exercise Activities and Dietary recommendations Goals    None      Immunization History  Administered Date(s) Administered  . Tdap 09/12/2008    Health  Maintenance  Topic Date Due  . Hepatitis C Screening  1956-02-03  . HIV Screening  09/18/1970  . COLONOSCOPY  09/17/2005  . INFLUENZA VACCINE  02/16/2015  . TETANUS/TDAP  09/12/2018      Discussed health benefits of physical activity, and encouraged him to engage in regular exercise appropriate for his age and condition.    --------------------------------------------------------------------  1. Annual physical exam Generally doing well.   2. Essential (primary) hypertension Stable. Ran out of metoprolol today.  - metoprolol tartrate (LOPRESSOR) 25 MG tablet; Take 1 tablet (25 mg total) by mouth 2 (two) times daily.  Dispense: 180 tablet; Refill: 3  3. Need for influenza vaccination  - Flu Vaccine QUAD 36+ mos IM  4. Lumbar herniated disc Healing well from recent discectomy. Continue current pain medications.   5. Anxiety Stable on current psychotropics.   6. Depression Continue current medications.   Completed disability forms

## 2015-04-04 ENCOUNTER — Encounter: Payer: Self-pay | Admitting: Family Medicine

## 2015-04-30 ENCOUNTER — Telehealth: Payer: Self-pay | Admitting: *Deleted

## 2015-04-30 NOTE — Telephone Encounter (Signed)
Patient called back and gave date of Total Disability. 12/14/2013

## 2015-04-30 NOTE — Telephone Encounter (Signed)
Called patient to get the total disability dates for form that Dr. Sherrie MustacheFisher is filling out.

## 2015-05-09 ENCOUNTER — Other Ambulatory Visit: Payer: Self-pay | Admitting: Family Medicine

## 2015-05-10 NOTE — Telephone Encounter (Signed)
Please call in lorazepam.  

## 2015-05-11 ENCOUNTER — Other Ambulatory Visit: Payer: Self-pay | Admitting: Family Medicine

## 2015-05-11 NOTE — Telephone Encounter (Signed)
Rx called in to pharmacy. 

## 2015-05-11 NOTE — Telephone Encounter (Signed)
Pt contacted office for refill request on the following medications:  LORazepam (ATIVAN) 1 MG tablet.  [facility] [employer] [facility] [address]  [phone]#[phone]/MW  Pt states he has been out since Saturday and is requesting he need a refill as soon as possible/MW

## 2015-05-14 ENCOUNTER — Other Ambulatory Visit: Payer: Self-pay | Admitting: Family Medicine

## 2015-05-14 NOTE — Telephone Encounter (Signed)
Please call in zolpidem  

## 2015-05-14 NOTE — Telephone Encounter (Signed)
Rx called in to pharmacy. 

## 2015-05-15 ENCOUNTER — Telehealth: Payer: Self-pay | Admitting: Family Medicine

## 2015-05-15 MED ORDER — OXYCODONE-ACETAMINOPHEN 10-325 MG PO TABS: 1.0000 | ORAL_TABLET | ORAL | Status: DC | PRN

## 2015-05-15 MED ORDER — HYDROCODONE-ACETAMINOPHEN 10-325 MG PO TABS: 1.0000 | ORAL_TABLET | ORAL | Status: DC | PRN

## 2015-05-15 NOTE — Addendum Note (Signed)
Addended by: Malva LimesFISHER, Artha Stavros E on: 05/15/2015 05:35 PM   Modules accepted: Orders, Medications

## 2015-05-15 NOTE — Telephone Encounter (Signed)
Pt called saying he has been waiting 2 days for his neurosurgeon to respomnd to his request for pain medicaion.  He said he has not been with his pain meds in 6 years.  He is afraid he is going into withdrawls.  He would like for you to call him back.. [phone]  Thanks Ter

## 2015-05-15 NOTE — Telephone Encounter (Signed)
He can pick up at front desk

## 2015-05-15 NOTE — Telephone Encounter (Signed)
Dr. Sherrie MustacheFisher, have you addressed this message? It looks like the message has been closed out and I had to create an addendum to send you this message.

## 2015-05-18 NOTE — Telephone Encounter (Signed)
Left message on vm

## 2015-06-08 ENCOUNTER — Other Ambulatory Visit: Payer: Self-pay | Admitting: Family Medicine

## 2015-06-08 MED ORDER — METHOCARBAMOL 750 MG PO TABS: 750.0000 mg | ORAL_TABLET | Freq: Four times a day (QID) | ORAL | Status: DC | PRN

## 2015-06-08 MED ORDER — HYDROCODONE-ACETAMINOPHEN 10-325 MG PO TABS: 1.0000 | ORAL_TABLET | ORAL | Status: DC | PRN

## 2015-06-08 NOTE — Telephone Encounter (Signed)
methocarbolol  HYDROcodone-acetaminophen (NORCO) 10-325 MG tablet

## 2015-06-08 NOTE — Telephone Encounter (Signed)
Patient request refills on the following medications. Patient wants a refill on the Methocarbamol but this medication is not listed in patient chart. I found it in All scripts but it is inactive. Is patient supposed to be taking this?   Call when ready for pickup.

## 2015-06-23 ENCOUNTER — Other Ambulatory Visit: Payer: Self-pay | Admitting: Family Medicine

## 2015-06-23 MED ORDER — HYDROCODONE-ACETAMINOPHEN 10-325 MG PO TABS: 1.0000 | ORAL_TABLET | ORAL | Status: DC | PRN

## 2015-06-23 NOTE — Telephone Encounter (Signed)
Pt needs refill HYDROcodone-acetaminophen (NORCO) 10-325 MG tablet ° ° °Thank sTeri °

## 2015-07-07 ENCOUNTER — Other Ambulatory Visit: Payer: Self-pay | Admitting: Family Medicine

## 2015-07-07 NOTE — Telephone Encounter (Signed)
Pt contacted office for refill request on the following medications:  HYDROcodone-acetaminophen (NORCO) 10-325 MG tablet.  CB#[phone]/MW

## 2015-07-07 NOTE — Telephone Encounter (Signed)
Patient requesting refill  Thanks

## 2015-07-08 MED ORDER — HYDROCODONE-ACETAMINOPHEN 10-325 MG PO TABS: 1.0000 | ORAL_TABLET | ORAL | Status: DC | PRN

## 2015-07-21 ENCOUNTER — Ambulatory Visit (INDEPENDENT_AMBULATORY_CARE_PROVIDER_SITE_OTHER): Payer: BLUE CROSS/BLUE SHIELD | Admitting: Family Medicine

## 2015-07-21 ENCOUNTER — Other Ambulatory Visit (INDEPENDENT_AMBULATORY_CARE_PROVIDER_SITE_OTHER): Payer: BLUE CROSS/BLUE SHIELD

## 2015-07-21 ENCOUNTER — Encounter: Payer: Self-pay | Admitting: Family Medicine

## 2015-07-21 VITALS — BP 140/84 | HR 78 | Temp 98.1°F | Resp 16 | Wt 205.0 lb

## 2015-07-21 DIAGNOSIS — M5030 Other cervical disc degeneration, unspecified cervical region: Secondary | ICD-10-CM | POA: Diagnosis not present

## 2015-07-21 DIAGNOSIS — G4700 Insomnia, unspecified: Secondary | ICD-10-CM | POA: Diagnosis not present

## 2015-07-21 DIAGNOSIS — E782 Mixed hyperlipidemia: Secondary | ICD-10-CM

## 2015-07-21 DIAGNOSIS — M549 Dorsalgia, unspecified: Secondary | ICD-10-CM | POA: Diagnosis not present

## 2015-07-21 DIAGNOSIS — F410 Panic disorder [episodic paroxysmal anxiety] without agoraphobia: Secondary | ICD-10-CM

## 2015-07-21 DIAGNOSIS — Z1211 Encounter for screening for malignant neoplasm of colon: Secondary | ICD-10-CM | POA: Diagnosis not present

## 2015-07-21 DIAGNOSIS — G8929 Other chronic pain: Secondary | ICD-10-CM

## 2015-07-21 LAB — IFOBT (OCCULT BLOOD): IMMUNOLOGICAL FECAL OCCULT BLOOD TEST: NEGATIVE

## 2015-07-21 MED ORDER — SIMVASTATIN 40 MG PO TABS: 40.0000 mg | ORAL_TABLET | Freq: Every day | ORAL | Status: DC

## 2015-07-21 MED ORDER — PAROXETINE HCL 40 MG PO TABS: 20.0000 mg | ORAL_TABLET | ORAL | Status: DC

## 2015-07-21 MED ORDER — LORAZEPAM 1 MG PO TABS: ORAL_TABLET | ORAL | Status: DC

## 2015-07-21 MED ORDER — HYDROCODONE-ACETAMINOPHEN 10-325 MG PO TABS: 1.0000 | ORAL_TABLET | ORAL | Status: DC | PRN

## 2015-07-21 NOTE — Progress Notes (Signed)
Patient: Dylan [name] Male    DOB: 04-13-1956   60 y.o.   MRN: [id number] Visit Date: 07/21/2015  Today's Provider: [name], MD   Chief Complaint  Patient presents with  . Back Pain    follow up  . Insomnia    follow up   Subjective:    HPI  Follow up on Chronic back pain:  Patient comes in today stating his back pain becomes aggravated when doing house work or sitting for long periods at a time. The last office visit was months ago and no changes were made. Patient states the Norco does not work as well as the Oxycodone. He states that Norco usually alleviates pain, but often wears off after 2-3 hours.  He takes, on average, about 6 tablets per day. He has follow up with Dr. Lovell Sheehan later this month.    Follow up Insomnia:  He presents today for evaluation of insomnia. Insomnia is getting worse. He has trouble sleeping every night.  He does not have difficulty FALLING asleep. He has difficulty STAYING asleep.  He is having anxiety. He is having a lot of stress in his life. Marland Kitchen He is having depression.  He is taking OTC sleeping aid. Tylenol PM. Patent states he has been on Ambien in the past but reports it did not help him stay asleep either.  He is not drinking alcohol to help sleep. He is not using illicit drugs.  He states he sleep the best in the last morning, and often gets up around noon. He sometimes takes a short nap in the afternoon and goes to be at 9 because he feels tired, but won't fall asleep.   --------------------------------------------------------------------      Allergies  Allergen Reactions  . Belsomra  [Suvorexant]     Hallucinations  . Duloxetine     Nervous  . Meloxicam     Stomach cramps  . Morphine Other (See Comments)   Previous Medications   ASPIRIN 81 MG TABLET    Take 81 mg by mouth daily.    CYCLOBENZAPRINE (FLEXERIL) 10 MG TABLET    Take 1 tablet (10 mg total) by mouth 3 (three) times daily as needed for muscle spasms.     DIPHENHYDRAMINE-ACETAMINOPHEN (TYLENOL PM) 25-500 MG TABS    Take 1 tablet by mouth at bedtime as needed (pain).   HYDROCODONE-ACETAMINOPHEN (NORCO) 10-325 MG TABLET    Take 1 tablet by mouth every 4 (four) hours as needed.   LORAZEPAM (ATIVAN) 1 MG TABLET    take 1 tablet by mouth twice a day   METHOCARBAMOL (ROBAXIN) 750 MG TABLET    Take 1-2 tablets (750-1,500 mg total) by mouth every 6 (six) hours as needed for muscle spasms.   METOPROLOL TARTRATE (LOPRESSOR) 25 MG TABLET    Take 1 tablet (25 mg total) by mouth 2 (two) times daily.   RANITIDINE (ZANTAC) 150 MG TABLET    Take 150 mg by mouth daily as needed for heartburn.   SIMVASTATIN (ZOCOR) 40 MG TABLET    Take 40 mg by mouth daily.    TAMSULOSIN (FLOMAX) 0.4 MG CAPS CAPSULE    Take 1 capsule (0.4 mg total) by mouth daily.   TRIAMCINOLONE ACETONIDE (NASACORT AQ NA)    Place 1 spray into the nose daily as needed (allergies).   ZOLPIDEM (AMBIEN CR) 12.5 MG CR TABLET    take 1 tablet by mouth at bedtime if needed for sleep    Review  of Systems  Constitutional: Positive for fatigue. Negative for fever, chills and appetite change.  Respiratory: Negative for chest tightness, shortness of breath and wheezing.   Cardiovascular: Negative for chest pain and palpitations.  Gastrointestinal: Negative for nausea, vomiting and abdominal pain.  Musculoskeletal: Positive for back pain.  Neurological: Positive for headaches.  Psychiatric/Behavioral: Positive for sleep disturbance and dysphoric mood. The patient is nervous/anxious.     Social History  Substance Use Topics  . Smoking status: Former Smoker -- 1.00 packs/day for 30 years    Types: Cigarettes    Quit date: 07/18/2002  . Smokeless tobacco: Not on file  . Alcohol Use: No   Objective:   BP 140/84 mmHg  Pulse 78  Temp(Src) 98.1 F (36.7 C) (Oral)  Resp 16  Wt 205 lb (92.987 kg)  SpO2 97%  Physical Exam  General appearance: alert, well developed, well nourished, cooperative  and in no distress Head: Normocephalic, without obvious abnormality, atraumatic Lungs: Respirations even and unlabored Extremities: No gross deformities Skin: Skin color, texture, turgor normal. No rashes seen  Psych: Appropriate mood and affect. Neurologic: Mental status: Alert, oriented to person, place, and time, thought content appropriate.     Assessment & Plan:     1. Screening for colon cancer iFOBT negative  2. Insomnia Reviewed sleep hygiene and stressed importance of not sleeping during the day, getting up and going to bed on a consistent scheduled. Not lying in bed awake fo more than 15 minutes. Avoid any additional sedative. May take 5mg  melatonin at bedtime  3. DDD (degenerative disc disease), cervical   4. Back pain, chronic He actually feels like Norco alleviated pain, but wears off soon if he is active. Advised it is ok to take a second pill after 2 hours if pain flares up, but is not to exceed 6 tablets in a day - HYDROcodone-acetaminophen (NORCO) 10-325 MG tablet; Take 1 tablet by mouth every 4 (four) hours as needed.  Dispense: 100 tablet; Refill: 0  5. Panic attacks He feels he is under more stress and often takes a third lorazepam on some days. He was previously on paroxetine which he states he tolerated well, and still has some left from previous prescription, which he will restart. - LORazepam (ATIVAN) 1 MG tablet; 1 tablet two to three times a day as needed for anxiety.  Dispense: 60 tablet; Refill: 5 - PARoxetine (PAXIL) 40 MG tablet; Take 0.5 tablets (20 mg total) by mouth every morning.  Dispense: 1 tablet; Refill: 1  6. Hyperlipidemia, mixed He states he ran out of simvastatin a few months ago and needs new prescription. Will restart and check lipids in a month.  - simvastatin (ZOCOR) 40 MG tablet; Take 1 tablet (40 mg total) by mouth daily.  Dispense: 30 tablet; Refill: 12       Mila Merryonald Fisher, MD  [facility]

## 2015-07-21 NOTE — Patient Instructions (Addendum)
   Recommend 5mg  melatonin just before going to bed  You need to wake up at 8am every day and do not sleep or go to bed until at least 10pm at night.    Recommend you start taking 1/2 tablet of paroxetine 40mg  tablet daily

## 2015-08-05 ENCOUNTER — Telehealth: Payer: Self-pay | Admitting: Family Medicine

## 2015-08-05 ENCOUNTER — Other Ambulatory Visit: Payer: Self-pay | Admitting: Family Medicine

## 2015-08-05 DIAGNOSIS — M549 Dorsalgia, unspecified: Principal | ICD-10-CM

## 2015-08-05 DIAGNOSIS — G8929 Other chronic pain: Secondary | ICD-10-CM

## 2015-08-05 MED ORDER — HYDROCODONE-ACETAMINOPHEN 10-325 MG PO TABS: 1.0000 | ORAL_TABLET | ORAL | Status: DC | PRN

## 2015-08-21 ENCOUNTER — Ambulatory Visit: Payer: BLUE CROSS/BLUE SHIELD | Admitting: Family Medicine

## 2015-08-26 ENCOUNTER — Encounter: Payer: Self-pay | Admitting: Family Medicine

## 2015-08-26 ENCOUNTER — Ambulatory Visit
Admission: RE | Admit: 2015-08-26 | Discharge: 2015-08-26 | Disposition: A | Discharge status: Home / Self Care | Payer: BLUE CROSS/BLUE SHIELD | Source: Ambulatory Visit | Attending: Family Medicine | Admitting: Family Medicine

## 2015-08-26 ENCOUNTER — Ambulatory Visit (INDEPENDENT_AMBULATORY_CARE_PROVIDER_SITE_OTHER): Payer: BLUE CROSS/BLUE SHIELD | Admitting: Family Medicine

## 2015-08-26 VITALS — BP 120/88 | HR 77 | Temp 98.2°F | Resp 16 | Ht 70.0 in | Wt 210.0 lb

## 2015-08-26 DIAGNOSIS — E782 Mixed hyperlipidemia: Secondary | ICD-10-CM

## 2015-08-26 DIAGNOSIS — M25462 Effusion, left knee: Secondary | ICD-10-CM | POA: Insufficient documentation

## 2015-08-26 DIAGNOSIS — M25562 Pain in left knee: Secondary | ICD-10-CM

## 2015-08-26 DIAGNOSIS — M93262 Osteochondritis dissecans, left knee: Secondary | ICD-10-CM | POA: Diagnosis not present

## 2015-08-26 DIAGNOSIS — G8929 Other chronic pain: Secondary | ICD-10-CM | POA: Diagnosis not present

## 2015-08-26 DIAGNOSIS — M2342 Loose body in knee, left knee: Secondary | ICD-10-CM | POA: Insufficient documentation

## 2015-08-26 DIAGNOSIS — F419 Anxiety disorder, unspecified: Secondary | ICD-10-CM

## 2015-08-26 DIAGNOSIS — M549 Dorsalgia, unspecified: Secondary | ICD-10-CM

## 2015-08-26 MED ORDER — HYDROCODONE-ACETAMINOPHEN 10-325 MG PO TABS: 1.0000 | ORAL_TABLET | ORAL | Status: DC | PRN

## 2015-08-26 NOTE — Progress Notes (Signed)
Patient: Dylan [name] Male    DOB: 1955/08/24   60 y.o.   MRN: [id number] Visit Date: 08/26/2015  Today's Provider: [name], MD   Chief Complaint  Patient presents with  . Follow-up  . Back Pain  . Hyperlipidemia  . Panic Attack   Subjective:    HPI  Follow-up for back pain from 07/21/2015; advised to take 2nd pill of Norco after 2 hours if pain flares up, but not to exceed 6 tabs a day. He states this regiment is working well. However he has been having trouble with falling frequently, which seems to be due to pain and weakness in his left knee. It frequently feels like it is giving out, especially when going up stairs, causing him to lose his balance.   Follow-up for panic attacks from 07/21/2015; hncreased lorazepam to 3 times a day as needed which he states has helped quite a bit and is sleeping much better. Also, started back on paroxetine 40 mg 1/2 tablet every morning but stopped after 3 days because it was him feel very anxious and jittery, panicky and having some heartburn. Generally feels much better now.     Lipid/Cholesterol, Follow-up:   Last seen for this1 months ago.  Management changes since that visit include; restarted simvastatin 40 mg qd . Last Lipid Panel:    Component Value Date/Time   CHOL 167 10/08/2014   TRIG 204* 10/08/2014   HDL 45 10/08/2014   LDLCALC 81 10/08/2014    Risk factors for vascular disease include n/a  He reports good compliance with treatment. He is not having side effects.  Current symptoms include none and have been stable. Weight trend: increasing steadily Prior visit with dietician: no Current diet: in general, a "healthy" diet   Current exercise: walking  Wt Readings from Last 3 Encounters:  07/21/15 205 lb (92.987 kg)  04/01/15 203 lb (92.08 kg)  03/18/15 198 lb (89.812 kg)    -------------------------------------------------------------------    Allergies  Allergen Reactions  . Belsomra  [Suvorexant]     Hallucinations  . Duloxetine     Nervous  . Meloxicam     Stomach cramps  . Morphine Other (See Comments)   Previous Medications   ASPIRIN 81 MG TABLET    Take 81 mg by mouth daily.    DIPHENHYDRAMINE-ACETAMINOPHEN (TYLENOL PM) 25-500 MG TABS    Take 1 tablet by mouth at bedtime as needed (pain).   HYDROCODONE-ACETAMINOPHEN (NORCO) 10-325 MG TABLET    Take 1 tablet by mouth every 4 (four) hours as needed.   LORAZEPAM (ATIVAN) 1 MG TABLET    1 tablet two to three times a day as needed for anxiety.   METHOCARBAMOL (ROBAXIN) 750 MG TABLET    Take 1-2 tablets (750-1,500 mg total) by mouth every 6 (six) hours as needed for muscle spasms.   METOPROLOL TARTRATE (LOPRESSOR) 25 MG TABLET    Take 1 tablet (25 mg total) by mouth 2 (two) times daily.   PAROXETINE (PAXIL) 40 MG TABLET    Take 0.5 tablets (20 mg total) by mouth every morning.   RANITIDINE (ZANTAC) 150 MG TABLET    Take 150 mg by mouth daily as needed for heartburn.   SIMVASTATIN (ZOCOR) 40 MG TABLET    Take 1 tablet (40 mg total) by mouth daily.   TAMSULOSIN (FLOMAX) 0.4 MG CAPS CAPSULE    Take 1 capsule (0.4 mg total) by mouth daily.   TRIAMCINOLONE ACETONIDE (NASACORT AQ NA)  Place 1 spray into the nose daily as needed (allergies).    Review of Systems  Constitutional: Negative for fever, chills and appetite change.  Respiratory: Negative for chest tightness, shortness of breath and wheezing.   Cardiovascular: Negative for chest pain and palpitations.  Gastrointestinal: Negative for nausea, vomiting and abdominal pain.  Musculoskeletal: Positive for back pain.  Neurological: Positive for dizziness and light-headedness.       Off balance    Social History  Substance Use Topics  . Smoking status: Former Smoker -- 1.00 packs/day for 30 years    Types: Cigarettes    Quit date: 07/18/2002  . Smokeless tobacco: Not on file  . Alcohol Use: No   Objective:   Ht  (1.778 m)  Physical Exam  General appearance: alert,  well developed, well nourished, cooperative and in no distress Head: Normocephalic, without obvious abnormality, atraumatic Lungs: Respirations even and unlabored Extremities: No gross deformities Skin: Skin color, texture, turgor normal. No rashes seen  Psych: Appropriate mood and affect. MS: Tender along left knee joint line. No effusion.      Assessment & Plan:     1. Hyperlipidemia, mixed He is tolerating simvastatin well with no adverse effects.   - Lipid panel - Hepatic function panel  2. Anxiety Improved, although he did not stay on paroxetine. Continue current regiment of lorazepam.   3. Left knee pain Resulting in some falls. Consider orthopedic referral or PT after reviewing XR - DG Knee Complete 4 Views Left; Future  4. Back pain, chronic Stable on current pain medications.  - HYDROcodone-acetaminophen (NORCO) 10-325 MG tablet; Take 1 tablet by mouth every 4 (four) hours as needed.  Dispense: 100 tablet; Refill: 0       [name], MD  [facility]

## 2015-08-26 NOTE — Patient Instructions (Signed)
Go to the [facility] on [address] for Left knee Xray

## 2015-08-27 ENCOUNTER — Telehealth: Payer: Self-pay

## 2015-08-27 LAB — LIPID PANEL
CHOL/HDL RATIO: 5.2 ratio — AB (ref 0.0–5.0)
Cholesterol, Total: 194 mg/dL (ref 100–199)
HDL: 37 mg/dL — ABNORMAL LOW (ref >39)
LDL CALC: 84 mg/dL (ref 0–99)
Triglycerides: 365 mg/dL — ABNORMAL HIGH (ref 0–149)
VLDL Cholesterol Cal: 73 mg/dL — ABNORMAL HIGH (ref 5–40)

## 2015-08-27 LAB — HEPATIC FUNCTION PANEL
ALK PHOS: 97 IU/L (ref 39–117)
ALT: 19 IU/L (ref 0–44)
AST: 21 IU/L (ref 0–40)
Albumin: 4.4 g/dL (ref 3.5–5.5)
BILIRUBIN TOTAL: 0.3 mg/dL (ref 0.0–1.2)
Bilirubin, Direct: 0.1 mg/dL (ref 0.00–0.40)
TOTAL PROTEIN: 6.5 g/dL (ref 6.0–8.5)

## 2015-08-27 NOTE — Telephone Encounter (Signed)
-----   Message from Malva Limes, MD sent at 08/27/2015  7:50 AM EST ----- Triglycerides are high at 365. Need to avoid sweets and starchy foods in diet. Check yearly. Other labs are normal.

## 2015-08-27 NOTE — Telephone Encounter (Signed)
Patient returned your call.  Thanks Barth Kirks

## 2015-08-27 NOTE — Telephone Encounter (Signed)
Attempted to contact patient. No answer and unable to leave a message due to voicemail being full.  

## 2015-08-27 NOTE — Telephone Encounter (Signed)
Unable to reach pt at this time.

## 2015-08-28 ENCOUNTER — Other Ambulatory Visit: Payer: Self-pay | Admitting: Family Medicine

## 2015-08-28 DIAGNOSIS — M25562 Pain in left knee: Secondary | ICD-10-CM

## 2015-08-28 DIAGNOSIS — R296 Repeated falls: Secondary | ICD-10-CM | POA: Insufficient documentation

## 2015-08-28 NOTE — Telephone Encounter (Signed)
Pt is returning call/MW °

## 2015-08-28 NOTE — Telephone Encounter (Signed)
Patient was notified of results. Patient expressed understanding. 

## 2015-09-08 ENCOUNTER — Other Ambulatory Visit: Payer: Self-pay | Admitting: Family Medicine

## 2015-09-08 DIAGNOSIS — M549 Dorsalgia, unspecified: Principal | ICD-10-CM

## 2015-09-08 DIAGNOSIS — G8929 Other chronic pain: Secondary | ICD-10-CM

## 2015-09-08 MED ORDER — HYDROCODONE-ACETAMINOPHEN 10-325 MG PO TABS: 1.0000 | ORAL_TABLET | ORAL | Status: DC | PRN

## 2015-09-22 ENCOUNTER — Other Ambulatory Visit: Payer: Self-pay | Admitting: *Deleted

## 2015-09-22 ENCOUNTER — Other Ambulatory Visit: Payer: Self-pay | Admitting: Family Medicine

## 2015-09-22 DIAGNOSIS — G8929 Other chronic pain: Secondary | ICD-10-CM

## 2015-09-22 DIAGNOSIS — M549 Dorsalgia, unspecified: Principal | ICD-10-CM

## 2015-09-22 NOTE — Telephone Encounter (Signed)
Rx request already sent to provider.  

## 2015-09-23 ENCOUNTER — Telehealth: Payer: Self-pay

## 2015-09-23 ENCOUNTER — Other Ambulatory Visit: Payer: Self-pay | Admitting: Family Medicine

## 2015-09-23 DIAGNOSIS — M549 Dorsalgia, unspecified: Principal | ICD-10-CM

## 2015-09-23 DIAGNOSIS — G8929 Other chronic pain: Secondary | ICD-10-CM

## 2015-09-23 NOTE — Telephone Encounter (Signed)
Pt called to check on status of RF on his Norco, he sent email about it through epic yesterday and today, he states usually he hears something back fast. Please review-aa

## 2015-09-24 MED ORDER — HYDROCODONE-ACETAMINOPHEN 10-325 MG PO TABS: 1.0000 | ORAL_TABLET | ORAL | Status: DC | PRN

## 2015-09-24 NOTE — Telephone Encounter (Signed)
Rx ready. Pt notified.

## 2015-09-24 NOTE — Addendum Note (Signed)
Addended by: Malva LimesFISHER, DONALD E on: 09/24/2015 07:52 AM   Modules accepted: Orders

## 2015-09-25 ENCOUNTER — Other Ambulatory Visit: Payer: Self-pay | Admitting: Family Medicine

## 2015-09-25 NOTE — Telephone Encounter (Signed)
Please call in zolpidem  

## 2015-09-25 NOTE — Telephone Encounter (Signed)
Rx called in to pharmacy. 

## 2015-10-08 ENCOUNTER — Other Ambulatory Visit: Payer: Self-pay | Admitting: Family Medicine

## 2015-10-08 DIAGNOSIS — G8929 Other chronic pain: Secondary | ICD-10-CM

## 2015-10-08 DIAGNOSIS — M549 Dorsalgia, unspecified: Principal | ICD-10-CM

## 2015-10-09 MED ORDER — HYDROCODONE-ACETAMINOPHEN 10-325 MG PO TABS: 1.0000 | ORAL_TABLET | ORAL | Status: DC | PRN

## 2015-10-21 ENCOUNTER — Other Ambulatory Visit: Payer: Self-pay | Admitting: Family Medicine

## 2015-10-21 DIAGNOSIS — G8929 Other chronic pain: Secondary | ICD-10-CM

## 2015-10-21 DIAGNOSIS — M549 Dorsalgia, unspecified: Principal | ICD-10-CM

## 2015-10-23 ENCOUNTER — Telehealth: Payer: Self-pay | Admitting: Family Medicine

## 2015-10-23 MED ORDER — HYDROCODONE-ACETAMINOPHEN 10-325 MG PO TABS: 1.0000 | ORAL_TABLET | ORAL | Status: DC | PRN

## 2015-10-23 NOTE — Telephone Encounter (Signed)
Pt needs refill HYDROcodone-acetaminophen (NORCO) 10-325 MG tablet

## 2015-10-23 NOTE — Telephone Encounter (Signed)
Advised patient that Rx was ready for pick up.  

## 2015-10-23 NOTE — Addendum Note (Signed)
Addended by: Malva LimesFISHER, DONALD E on: 10/23/2015 09:46 AM   Modules accepted: Orders

## 2015-11-05 ENCOUNTER — Other Ambulatory Visit: Payer: Self-pay | Admitting: Family Medicine

## 2015-11-05 DIAGNOSIS — M549 Dorsalgia, unspecified: Principal | ICD-10-CM

## 2015-11-05 DIAGNOSIS — G8929 Other chronic pain: Secondary | ICD-10-CM

## 2015-11-05 MED ORDER — HYDROCODONE-ACETAMINOPHEN 10-325 MG PO TABS: 1.0000 | ORAL_TABLET | ORAL | Status: DC | PRN

## 2015-11-05 NOTE — Addendum Note (Signed)
Addended by: Malva LimesFISHER, DONALD E on: 11/05/2015 01:12 PM   Modules accepted: Orders

## 2015-11-06 ENCOUNTER — Telehealth: Payer: Self-pay

## 2015-11-06 DIAGNOSIS — M549 Dorsalgia, unspecified: Principal | ICD-10-CM

## 2015-11-06 DIAGNOSIS — G8929 Other chronic pain: Secondary | ICD-10-CM

## 2015-11-06 MED ORDER — HYDROCODONE-ACETAMINOPHEN 10-325 MG PO TABS: 1.0000 | ORAL_TABLET | ORAL | Status: DC | PRN

## 2015-11-06 NOTE — Telephone Encounter (Signed)
Reprinted this prescription due to the prescription not printing yesterday when approved by Dr. Sherrie MustacheFisher.

## 2015-11-22 ENCOUNTER — Other Ambulatory Visit: Payer: Self-pay | Admitting: Family Medicine

## 2015-11-22 DIAGNOSIS — M549 Dorsalgia, unspecified: Principal | ICD-10-CM

## 2015-11-22 DIAGNOSIS — G8929 Other chronic pain: Secondary | ICD-10-CM

## 2015-11-23 MED ORDER — HYDROCODONE-ACETAMINOPHEN 10-325 MG PO TABS: 1.0000 | ORAL_TABLET | ORAL | Status: DC | PRN

## 2015-12-07 ENCOUNTER — Other Ambulatory Visit: Payer: Self-pay | Admitting: Family Medicine

## 2015-12-07 DIAGNOSIS — G8929 Other chronic pain: Secondary | ICD-10-CM

## 2015-12-07 DIAGNOSIS — M549 Dorsalgia, unspecified: Principal | ICD-10-CM

## 2015-12-08 MED ORDER — HYDROCODONE-ACETAMINOPHEN 10-325 MG PO TABS: 1.0000 | ORAL_TABLET | ORAL | Status: DC | PRN

## 2015-12-22 ENCOUNTER — Other Ambulatory Visit: Payer: Self-pay | Admitting: Family Medicine

## 2015-12-22 DIAGNOSIS — M549 Dorsalgia, unspecified: Principal | ICD-10-CM

## 2015-12-22 DIAGNOSIS — G8929 Other chronic pain: Secondary | ICD-10-CM

## 2015-12-22 MED ORDER — HYDROCODONE-ACETAMINOPHEN 10-325 MG PO TABS: 1.0000 | ORAL_TABLET | ORAL | Status: DC | PRN

## 2016-01-01 ENCOUNTER — Encounter: Payer: Self-pay | Admitting: Family Medicine

## 2016-01-01 ENCOUNTER — Ambulatory Visit (INDEPENDENT_AMBULATORY_CARE_PROVIDER_SITE_OTHER): Payer: BLUE CROSS/BLUE SHIELD | Admitting: Family Medicine

## 2016-01-01 VITALS — BP 130/90 | HR 90 | Temp 98.1°F | Resp 16 | Ht 70.0 in | Wt 206.0 lb

## 2016-01-01 DIAGNOSIS — L282 Other prurigo: Secondary | ICD-10-CM | POA: Diagnosis not present

## 2016-01-01 DIAGNOSIS — F410 Panic disorder [episodic paroxysmal anxiety] without agoraphobia: Secondary | ICD-10-CM | POA: Diagnosis not present

## 2016-01-01 DIAGNOSIS — F419 Anxiety disorder, unspecified: Secondary | ICD-10-CM

## 2016-01-01 DIAGNOSIS — F329 Major depressive disorder, single episode, unspecified: Secondary | ICD-10-CM | POA: Diagnosis not present

## 2016-01-01 DIAGNOSIS — G8929 Other chronic pain: Secondary | ICD-10-CM | POA: Diagnosis not present

## 2016-01-01 DIAGNOSIS — F32A Depression, unspecified: Secondary | ICD-10-CM

## 2016-01-01 DIAGNOSIS — M549 Dorsalgia, unspecified: Secondary | ICD-10-CM | POA: Diagnosis not present

## 2016-01-01 MED ORDER — LORAZEPAM 1 MG PO TABS: ORAL_TABLET | ORAL | Status: DC

## 2016-01-01 MED ORDER — HYDROCODONE-ACETAMINOPHEN 10-325 MG PO TABS: 1.0000 | ORAL_TABLET | ORAL | Status: DC | PRN

## 2016-01-01 MED ORDER — ESCITALOPRAM OXALATE 5 MG PO TABS: ORAL_TABLET | ORAL | Status: DC

## 2016-01-01 MED ORDER — DOXYCYCLINE HYCLATE 100 MG PO TABS: 100.0000 mg | ORAL_TABLET | Freq: Two times a day (BID) | ORAL | Status: AC

## 2016-01-01 MED ORDER — HYDROXYZINE HCL 10 MG PO TABS: 10.0000 mg | ORAL_TABLET | Freq: Three times a day (TID) | ORAL | Status: AC | PRN

## 2016-01-01 NOTE — Patient Instructions (Signed)
Try OTC Aveeno Oatmeal Bath for itching

## 2016-01-01 NOTE — Progress Notes (Signed)
Patient: Dylan [name]    DOB: 02/08/1956   60 y.o.   MRN: [id number] Visit Date: 01/01/2016  Today's Provider: Mila Merryonald Dovber Ernest, MD   Chief Complaint  Patient presents with  . Anxiety   Subjective:    HPI  Anxiety: He was previously on paroxetine which he stopped several months ago. He doesn't remember exactly why, but thinks he has adverse effects from medication. He states anxiety has gotten progressively worse and has had a few panic attacks. He is also feeling depressed. Is still taking lorazepam. He had also taken escitalopram in the past which he doesn't remember taking, but per medical records worked well for him.    Patient has multiple bugs bites on his legs, since Tuesday 12/29/2015. Patient stated that hasn't felt getting the bugs bite. Symptoms include fatigue and his anxiety is worse.    Allergies  Allergen Reactions  . Belsomra  [Suvorexant]     Hallucinations  . Duloxetine     Nervous  . Meloxicam     Stomach cramps  . Morphine Other (See Comments)   Current Meds  Medication Sig  . HYDROcodone-acetaminophen (NORCO) 10-325 MG tablet Take 1 tablet by mouth every 4 (four) hours as needed.  Marland Kitchen. LORazepam (ATIVAN) 1 MG tablet 1 tablet two to three times a day as needed for anxiety.  . metoprolol tartrate (LOPRESSOR) 25 MG tablet Take 1 tablet (25 mg total) by mouth 2 (two) times daily.  . ranitidine (ZANTAC) 150 MG tablet Take 150 mg by mouth daily as needed for heartburn.  . simvastatin (ZOCOR) 40 MG tablet Take 1 tablet (40 mg total) by mouth daily.  . Triamcinolone Acetonide (NASACORT AQ NA) Place 1 spray into the nose daily as needed (allergies).    Review of Systems  Constitutional: Positive for fatigue. Negative for fever, chills and appetite change.  Respiratory: Negative for chest tightness, shortness of breath and wheezing.   Cardiovascular: Negative for chest pain and palpitations.  Gastrointestinal: Negative for nausea, vomiting and abdominal  pain.  Psychiatric/Behavioral: The patient is nervous/anxious.     Social History  Substance Use Topics  . Smoking status: Former Smoker -- 1.00 packs/day for 30 years    Types: Cigarettes    Quit date: 07/18/2002  . Smokeless tobacco: Not on file  . Alcohol Use: No   Objective:   BP 130/90 mmHg  Pulse 90  Temp(Src) 98.1 F (36.7 C) (Oral)  Resp 16  Ht 5\' 10"  (1.778 m)  Wt 206 lb (93.441 kg)  BMI 29.56 kg/m2  SpO2 98%  Physical Exam  General appearance: alert, well developed, well nourished, cooperative and in no distress Head: Normocephalic, without obvious abnormality, atraumatic Respiratory: Respirations even and unlabored, normal respiratory rate Extremities: No gross deformities Skin: scattered papular lesions distributed as per HPI with excoriations.      Assessment & Plan:     1. Panic attacks Restart escitalopram, continue lorazepam for the time being.  - escitalopram (LEXAPRO) 5 MG tablet; 1/2 tablet daily for 6 days, then 1 tablet for 6 days, then 2 Tablets daily  Dispense: 30 tablet; Refill: 1 - LORazepam (ATIVAN) 1 MG tablet; One tablet up to four times daily as needed for anxiety. May fill on or after 01/01/2016  Dispense: 120 tablet; Refill: 0  2. Back pain, chronic Stable on current medications.  - HYDROcodone-acetaminophen (NORCO) 10-325 MG tablet; Take 1 tablet by mouth every 4 (four) hours as needed.  Dispense: 100 tablet; Refill:  0  3. Papular urticaria  - hydrOXYzine (ATARAX/VISTARIL) 10 MG tablet; Take 1 tablet (10 mg total) by mouth 3 (three) times daily as needed for itching.  Dispense: 30 tablet; Refill: 0 - doxycycline (VIBRA-TABS) 100 MG tablet; Take 1 tablet (100 mg total) by mouth 2 (two) times daily.  Dispense: 14 tablet; Refill: 0  4. Acute anxiety  - escitalopram (LEXAPRO) 5 MG tablet; 1/2 tablet daily for 6 days, then 1 tablet for 6 days, then 2 Tablets daily  Dispense: 30 tablet; Refill: 1  5. Depression  - escitalopram (LEXAPRO) 5  MG tablet; 1/2 tablet daily for 6 days, then 1 tablet for 6 days, then 2 Tablets daily  Dispense: 30 tablet; Refill: 1  Follow up 3-4 weeks.     The entirety of the information documented in the History of Present Illness, Review of Systems and Physical Exam were personally obtained by me. Portions of this information were initially documented by April M. Hyacinth Meeker, CMA and reviewed by me for thoroughness and accuracy.    [name], MD  [facility]

## 2016-01-19 ENCOUNTER — Other Ambulatory Visit: Payer: Self-pay | Admitting: Family Medicine

## 2016-01-19 DIAGNOSIS — G8929 Other chronic pain: Secondary | ICD-10-CM

## 2016-01-19 DIAGNOSIS — M549 Dorsalgia, unspecified: Principal | ICD-10-CM

## 2016-01-21 ENCOUNTER — Other Ambulatory Visit: Payer: Self-pay | Admitting: *Deleted

## 2016-01-21 DIAGNOSIS — G8929 Other chronic pain: Secondary | ICD-10-CM

## 2016-01-21 DIAGNOSIS — M549 Dorsalgia, unspecified: Principal | ICD-10-CM

## 2016-01-21 MED ORDER — HYDROCODONE-ACETAMINOPHEN 10-325 MG PO TABS: 1.0000 | ORAL_TABLET | ORAL | Status: DC | PRN

## 2016-01-21 NOTE — Telephone Encounter (Signed)
Pt called to get status of refill.   Pt states he is out of medication.

## 2016-02-01 ENCOUNTER — Other Ambulatory Visit: Payer: Self-pay | Admitting: Family Medicine

## 2016-02-01 ENCOUNTER — Ambulatory Visit (INDEPENDENT_AMBULATORY_CARE_PROVIDER_SITE_OTHER): Payer: BLUE CROSS/BLUE SHIELD | Admitting: Family Medicine

## 2016-02-01 ENCOUNTER — Encounter: Payer: Self-pay | Admitting: Family Medicine

## 2016-02-01 VITALS — BP 110/74 | HR 102 | Temp 97.5°F | Resp 16 | Ht 70.0 in | Wt 203.0 lb

## 2016-02-01 DIAGNOSIS — F410 Panic disorder [episodic paroxysmal anxiety] without agoraphobia: Secondary | ICD-10-CM

## 2016-02-01 DIAGNOSIS — F32A Depression, unspecified: Secondary | ICD-10-CM

## 2016-02-01 DIAGNOSIS — G8929 Other chronic pain: Secondary | ICD-10-CM | POA: Diagnosis not present

## 2016-02-01 DIAGNOSIS — F329 Major depressive disorder, single episode, unspecified: Secondary | ICD-10-CM

## 2016-02-01 DIAGNOSIS — M549 Dorsalgia, unspecified: Secondary | ICD-10-CM | POA: Diagnosis not present

## 2016-02-01 DIAGNOSIS — F419 Anxiety disorder, unspecified: Secondary | ICD-10-CM | POA: Diagnosis not present

## 2016-02-01 MED ORDER — ESCITALOPRAM OXALATE 20 MG PO TABS: ORAL_TABLET | ORAL | Status: DC

## 2016-02-01 MED ORDER — HYDROCODONE-ACETAMINOPHEN 10-325 MG PO TABS: 1.0000 | ORAL_TABLET | ORAL | Status: DC | PRN

## 2016-02-01 NOTE — Progress Notes (Signed)
Patient: Dylan CopasBarry Mednick Male    DOB: 06/27/1956   60 y.o.   MRN: [id number] Visit Date: 02/01/2016  Today's Provider: Mila Merryonald Rosealyn Little, MD   Chief Complaint  Patient presents with  . Follow-up  . Anxiety  . Depression   Subjective:    HPI  Panic attacks: From 01/01/2016- We Restarted escitalopram at 1/2 x 5mg  daily and have since titrated to 2x5mg  daily. We temporarily increase lorazapam to 2 BID, but has since been able to cut back to BID.   Depression: From 01/01/2016-restarted escitalopram (LEXAPRO) 5 MG tablet. He still feels depressed, but is working on getting out of the house more which is helpful  Acute anxiety: From 01/01/2016- restarted escitalopram (LEXAPRO) 5 MG tablet  Chronic back pain Has improved improved a little bit since last visit. Still taking hydrocodone 4-5 times a day, but seems to be more effective.    Allergies  Allergen Reactions  . Belsomra  [Suvorexant]     Hallucinations  . Duloxetine     Nervous  . Meloxicam     Stomach cramps  . Morphine Other (See Comments)   Current Meds  Medication Sig  . aspirin 81 MG tablet Take 81 mg by mouth daily. Reported on 01/01/2016  . diphenhydramine-acetaminophen (TYLENOL PM) 25-500 MG TABS Take 1 tablet by mouth at bedtime as needed (pain). Reported on 01/01/2016  . escitalopram (LEXAPRO) 5 MG tablet 1/2 tablet daily for 6 days, then 1 tablet for 6 days, then 2 Tablets daily (Patient taking differently: Take 5 mg by mouth 2 (two) times daily. )  . HYDROcodone-acetaminophen (NORCO) 10-325 MG tablet Take 1 tablet by mouth every 4 (four) hours as needed.  Marland Kitchen. LORazepam (ATIVAN) 1 MG tablet One tablet up to four times daily as needed for anxiety. May fill on or after 01/01/2016  . methocarbamol (ROBAXIN) 750 MG tablet Take 1-2 tablets (750-1,500 mg total) by mouth every 6 (six) hours as needed for muscle spasms.  . metoprolol tartrate (LOPRESSOR) 25 MG tablet Take 1 tablet (25 mg total) by mouth 2 (two) times daily.    . ranitidine (ZANTAC) 150 MG tablet Take 150 mg by mouth daily as needed for heartburn.  . simvastatin (ZOCOR) 40 MG tablet Take 1 tablet (40 mg total) by mouth daily.  . tamsulosin (FLOMAX) 0.4 MG CAPS capsule Take 1 capsule (0.4 mg total) by mouth daily.  . Triamcinolone Acetonide (NASACORT AQ NA) Place 1 spray into the nose daily as needed (allergies).  . zolpidem (AMBIEN CR) 12.5 MG CR tablet TAKE 1 TABLET BY MOUTH AT BEDTIME AS NEEDED FOR SLEEP    Review of Systems  Constitutional: Negative for fever, chills and appetite change.  Respiratory: Negative for chest tightness, shortness of breath and wheezing.   Cardiovascular: Negative for chest pain and palpitations.  Gastrointestinal: Negative for nausea, vomiting and abdominal pain.    Social History  Substance Use Topics  . Smoking status: Former Smoker -- 1.00 packs/day for 30 years    Types: Cigarettes    Quit date: 07/18/2002  . Smokeless tobacco: Not on file  . Alcohol Use: No   Objective:   BP 110/74 mmHg  Pulse 102  Temp(Src) 97.5 F (36.4 C) (Oral)  Resp 16  Ht 5\' 10"  (1.778 m)  Wt 203 lb (92.08 kg)  BMI 29.13 kg/m2  SpO2 95%  Physical Exam  General appearance: alert, well developed, well nourished, cooperative and in no distress Head: Normocephalic, without obvious abnormality, atraumatic  Respiratory: Respirations even and unlabored, normal respiratory rate Extremities: No gross deformities  Psych: Appropriate mood and affect. Neurologic: Mental status: Alert, oriented to person, place, and time, thought content appropriate.     Assessment & Plan:     1. Panic attacks Tolerating escitalopram well. Titrate on up to  a day. Counseled to expect to be able to reduce lorazepam as escitalopram becomes more effective.  - escitalopram (LEXAPRO) 20 MG tablet; 1/2 tablet daily for 4 days, then increase to one tablet daily.    Dispense: 30 tablet; Refill: 3  2. Acute anxiety  - escitalopram (LEXAPRO) 20 MG  tablet; 1/2 tablet daily for 4 days, then increase to one tablet daily.  Dispense: 30 tablet; Refill: 3  3. Depression  - escitalopram (LEXAPRO) 20 MG tablet; 1/2 tablet daily for 4 days, then increase to one tablet daily.   Dispense: 30 tablet; Refill: 3  4. Back pain, chronic Improving Continue current pain medications and counseled to try to reduce dose and anxiety and depression improves.  - HYDROcodone-acetaminophen (NORCO) 10-325 MG tablet; Take 1 tablet by mouth every 4 (four) hours as needed.  Dispense: 100 tablet; Refill: 0  Return in about 4 weeks (around 02/29/2016).     The entirety of the information documented in the History of Present Illness, Review of Systems and Physical Exam were personally obtained by me. Portions of this information were initially documented by April M. Hyacinth Meeker, CMA and reviewed by me for thoroughness and accuracy.    [name], MD  [facility]

## 2016-02-01 NOTE — Telephone Encounter (Signed)
Please call in lorazepam.  

## 2016-02-01 NOTE — Telephone Encounter (Signed)
Rx called in to pharmacy. 

## 2016-02-01 NOTE — Telephone Encounter (Signed)
Please call in lorazepam. Also, sent in new prescription for 20mg  escitalopram today. Can you make sure pharmacy cancels the old prescription for 5mg  tablets.

## 2016-02-16 ENCOUNTER — Other Ambulatory Visit: Payer: Self-pay | Admitting: Family Medicine

## 2016-02-16 DIAGNOSIS — G8929 Other chronic pain: Secondary | ICD-10-CM

## 2016-02-16 DIAGNOSIS — M549 Dorsalgia, unspecified: Principal | ICD-10-CM

## 2016-02-16 MED ORDER — HYDROCODONE-ACETAMINOPHEN 10-325 MG PO TABS: 1.0000 | ORAL_TABLET | ORAL | 0 refills | Status: DC | PRN

## 2016-02-24 NOTE — Telephone Encounter (Signed)
error 

## 2016-02-29 ENCOUNTER — Ambulatory Visit (INDEPENDENT_AMBULATORY_CARE_PROVIDER_SITE_OTHER): Payer: BLUE CROSS/BLUE SHIELD | Admitting: Family Medicine

## 2016-02-29 ENCOUNTER — Encounter: Payer: Self-pay | Admitting: Family Medicine

## 2016-02-29 DIAGNOSIS — M549 Dorsalgia, unspecified: Secondary | ICD-10-CM

## 2016-02-29 DIAGNOSIS — F419 Anxiety disorder, unspecified: Secondary | ICD-10-CM | POA: Diagnosis not present

## 2016-02-29 DIAGNOSIS — F329 Major depressive disorder, single episode, unspecified: Secondary | ICD-10-CM

## 2016-02-29 DIAGNOSIS — G8929 Other chronic pain: Secondary | ICD-10-CM

## 2016-02-29 DIAGNOSIS — F410 Panic disorder [episodic paroxysmal anxiety] without agoraphobia: Secondary | ICD-10-CM

## 2016-02-29 DIAGNOSIS — F32A Depression, unspecified: Secondary | ICD-10-CM

## 2016-02-29 MED ORDER — HYDROCODONE-ACETAMINOPHEN 10-325 MG PO TABS: 1.0000 | ORAL_TABLET | ORAL | 0 refills | Status: DC | PRN

## 2016-02-29 MED ORDER — ESCITALOPRAM OXALATE 10 MG PO TABS: 10.0000 mg | ORAL_TABLET | Freq: Every day | ORAL | 6 refills | Status: DC

## 2016-02-29 NOTE — Progress Notes (Signed)
Patient: Dylan [name] Male    DOB: 01/11/1956   60 y.o.   MRN: [id number] Visit Date: 02/29/2016  Today's Provider: Mila Merryonald Walid Haig, MD   Chief Complaint  Patient presents with  . Panic Attack    follow up   . Depression  . Anxiety  . Back Pain   Subjective:    HPI Follow up Panic Attacks/ Depression/ Anxiety: Patient was last seen for these problems 4 weeks ago. Management during that visit includes increasing Escitalopram to 20mg  daily. Patient was counseled to expect to be able to reduce Lorazepam as Escitalopram becomes more effective.  Patient comes in today stating he tried taking 1 full pill of the Escitalopram daily, but he could not tolerate it due to the side effects. Patient states he felt swimmy headed, nauseous and weak on the 20mg  dose. Patient has been taking 1/2 tablet of the Escitalopram. He feels that it does help with anxiety and panic attack most of the time. Has cut back to 2 lorazepam most days.   Follow up Chronic back pain: Patient was last seen for this problem 4 weeks ago. During that visit, patient was advised to try reducing dose of pain medications and Anxiety and Depression improved. Today patient comes in stating he has not been able to reduce the dose of pain medication since the last office visit.     Allergies  Allergen Reactions  . Belsomra  [Suvorexant]     Hallucinations  . Duloxetine     Nervous  . Meloxicam     Stomach cramps  . Morphine Other (See Comments)   Current Meds  Medication Sig  . aspirin 81 MG tablet Take 81 mg by mouth daily. Reported on 01/01/2016  . diphenhydramine-acetaminophen (TYLENOL PM) 25-500 MG TABS Take 1 tablet by mouth at bedtime as needed (pain). Reported on 01/01/2016  . escitalopram (LEXAPRO) 20 MG tablet 1/2 tablet daily for 4 days, then increase to one tablet daily.  PLEASE CANCEL REFILL FOR THE 5MG  TABLET  . HYDROcodone-acetaminophen (NORCO) 10-325 MG tablet Take 1 tablet by mouth every 4 (four) hours as  needed.  Marland Kitchen. LORazepam (ATIVAN) 1 MG tablet take 1 tablet by mouth UP TO four times a day if needed for anxiety  . methocarbamol (ROBAXIN) 750 MG tablet Take 1-2 tablets (750-1,500 mg total) by mouth every 6 (six) hours as needed for muscle spasms.  . metoprolol tartrate (LOPRESSOR) 25 MG tablet Take 1 tablet (25 mg total) by mouth 2 (two) times daily.  . ranitidine (ZANTAC) 150 MG tablet Take 150 mg by mouth daily as needed for heartburn.  . simvastatin (ZOCOR) 40 MG tablet Take 1 tablet (40 mg total) by mouth daily.  . tamsulosin (FLOMAX) 0.4 MG CAPS capsule Take 1 capsule (0.4 mg total) by mouth daily.  . Triamcinolone Acetonide (NASACORT AQ NA) Place 1 spray into the nose daily as needed (allergies).    Review of Systems  Constitutional: Negative for appetite change, chills and fever.  Respiratory: Negative for chest tightness, shortness of breath and wheezing.   Cardiovascular: Negative for chest pain and palpitations.  Gastrointestinal: Negative for abdominal pain, nausea and vomiting.  Psychiatric/Behavioral: Positive for agitation and dysphoric mood. Negative for confusion, decreased concentration, self-injury and suicidal ideas. The patient is nervous/anxious.     Social History  Substance Use Topics  . Smoking status: Former Smoker    Packs/day: 1.00    Years: 30.00    Types: Cigarettes    Quit  date: 07/18/2002  . Smokeless tobacco: Never Used  . Alcohol use No   Objective:   BP 140/82 (BP Location: Right Arm, Patient Position: Sitting, Cuff Size: Large)   Pulse 93   Temp 98.3 F (36.8 C) (Oral)   Resp 16   Wt 203 lb (92.1 kg)   SpO2 96% Comment: room air  BMI 29.13 kg/m   Physical Exam   General Appearance:    Alert, cooperative, no distress  Eyes:    PERRL, conjunctiva/corneas clear, EOM's intact       Lungs:     Clear to auscultation bilaterally, respirations unlabored  Heart:    Regular rate and rhythm  Neurologic:   Awake, alert, oriented x 3. No apparent  focal neurological           defect.           Assessment & Plan:     1. Panic attacks Did not tolerated 20mg  Lexapro, but significantly improved on 10mg . Will continue for now. Consider taking 15mg  if 10mg  becomes less effective.  - escitalopram (LEXAPRO) 10 MG tablet; Take 1 tablet (10 mg total) by mouth daily.  Dispense: 30 tablet; Refill: 6  2. Acute anxiety Improved, requiring fewer lorazepam.  - escitalopram (LEXAPRO) 10 MG tablet; Take 1 tablet (10 mg total) by mouth daily.  Dispense: 30 tablet; Refill: 6  3. Depression Improved.  - escitalopram (LEXAPRO) 10 MG tablet; Take 1 tablet (10 mg total) by mouth daily.  Dispense: 30 tablet; Refill: 6  4. Back pain, chronic Encourage to keep opiated to a minimum. Recommend frequent ice application and regular exercise.  - HYDROcodone-acetaminophen (NORCO) 10-325 MG tablet; Take 1 tablet by mouth every 4 (four) hours as needed.  Dispense: 150 tablet; Refill: 0     The entirety of the information documented in the History of Present Illness, Review of Systems and Physical Exam were personally obtained by me. Portions of this information were initially documented by Awilda Billoshena Chambers, CMA and reviewed by me for thoroughness and accuracy.    Mila Merryonald Tane Biegler, MD  [facility]

## 2016-03-23 ENCOUNTER — Other Ambulatory Visit: Payer: Self-pay | Admitting: Family Medicine

## 2016-03-23 DIAGNOSIS — M549 Dorsalgia, unspecified: Principal | ICD-10-CM

## 2016-03-23 DIAGNOSIS — G8929 Other chronic pain: Secondary | ICD-10-CM

## 2016-03-24 ENCOUNTER — Other Ambulatory Visit: Payer: Self-pay | Admitting: Family Medicine

## 2016-03-24 DIAGNOSIS — M549 Dorsalgia, unspecified: Principal | ICD-10-CM

## 2016-03-24 DIAGNOSIS — G8929 Other chronic pain: Secondary | ICD-10-CM

## 2016-03-24 MED ORDER — HYDROCODONE-ACETAMINOPHEN 10-325 MG PO TABS: 1.0000 | ORAL_TABLET | ORAL | 0 refills | Status: DC | PRN

## 2016-03-24 NOTE — Telephone Encounter (Signed)
Pt is requesting to pick up the RX for HYDROcodone-acetaminophen (NORCO) 10-325 MG tablet by tomorrow because he will run out Saturday 03/26/16. Please advise. Thanks TNP

## 2016-04-01 ENCOUNTER — Encounter: Payer: BLUE CROSS/BLUE SHIELD | Admitting: Family Medicine

## 2016-04-11 ENCOUNTER — Other Ambulatory Visit: Payer: Self-pay | Admitting: Family Medicine

## 2016-04-11 DIAGNOSIS — F410 Panic disorder [episodic paroxysmal anxiety] without agoraphobia: Secondary | ICD-10-CM

## 2016-04-11 NOTE — Telephone Encounter (Signed)
Please call in lorazepam.  

## 2016-04-12 NOTE — Telephone Encounter (Signed)
Rx called in to pharmacy. 

## 2016-04-13 ENCOUNTER — Other Ambulatory Visit: Payer: Self-pay | Admitting: Family Medicine

## 2016-04-13 DIAGNOSIS — G8929 Other chronic pain: Secondary | ICD-10-CM

## 2016-04-13 DIAGNOSIS — M549 Dorsalgia, unspecified: Principal | ICD-10-CM

## 2016-04-15 MED ORDER — HYDROCODONE-ACETAMINOPHEN 10-325 MG PO TABS: 1.0000 | ORAL_TABLET | ORAL | 0 refills | Status: DC | PRN

## 2016-04-15 NOTE — Telephone Encounter (Signed)
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 Will you review this. Patient will run out of medication on Sunday 04/17/2016. Thanks. I will be out of the office this afternoon.

## 2016-04-15 NOTE — Telephone Encounter (Signed)
Pt states he will be out of medication/HYDROcodone-acetaminophen (NORCO) 10-325 MG tablet by the weekend and is requesting to pick this up today if possible.  Pt states she sent the  request w/my chart on Wednesday/MW

## 2016-04-20 ENCOUNTER — Inpatient Hospital Stay (HOSPITAL_COMMUNITY)
Admission: EM | Admit: 2016-04-20 | Discharge: 2016-04-24 | DRG: 184 | Disposition: A | Discharge status: Home Health | Payer: BLUE CROSS/BLUE SHIELD | Attending: General Surgery | Admitting: General Surgery

## 2016-04-20 ENCOUNTER — Encounter (HOSPITAL_COMMUNITY): Payer: Self-pay | Admitting: *Deleted

## 2016-04-20 ENCOUNTER — Ambulatory Visit (INDEPENDENT_AMBULATORY_CARE_PROVIDER_SITE_OTHER): Payer: BLUE CROSS/BLUE SHIELD | Admitting: Family Medicine

## 2016-04-20 ENCOUNTER — Encounter: Payer: Self-pay | Admitting: Family Medicine

## 2016-04-20 ENCOUNTER — Emergency Department (HOSPITAL_COMMUNITY): Payer: BLUE CROSS/BLUE SHIELD

## 2016-04-20 VITALS — BP 130/74 | HR 107 | Temp 98.4°F | Resp 16 | Ht 70.0 in | Wt 203.0 lb

## 2016-04-20 DIAGNOSIS — Z125 Encounter for screening for malignant neoplasm of prostate: Secondary | ICD-10-CM | POA: Diagnosis not present

## 2016-04-20 DIAGNOSIS — S22009A Unspecified fracture of unspecified thoracic vertebra, initial encounter for closed fracture: Secondary | ICD-10-CM | POA: Diagnosis present

## 2016-04-20 DIAGNOSIS — R42 Dizziness and giddiness: Secondary | ICD-10-CM

## 2016-04-20 DIAGNOSIS — G8929 Other chronic pain: Secondary | ICD-10-CM

## 2016-04-20 DIAGNOSIS — Z885 Allergy status to narcotic agent status: Secondary | ICD-10-CM

## 2016-04-20 DIAGNOSIS — Z23 Encounter for immunization: Secondary | ICD-10-CM

## 2016-04-20 DIAGNOSIS — M545 Low back pain: Secondary | ICD-10-CM

## 2016-04-20 DIAGNOSIS — I10 Essential (primary) hypertension: Secondary | ICD-10-CM | POA: Diagnosis not present

## 2016-04-20 DIAGNOSIS — S42001A Fracture of unspecified part of right clavicle, initial encounter for closed fracture: Secondary | ICD-10-CM | POA: Diagnosis present

## 2016-04-20 DIAGNOSIS — E782 Mixed hyperlipidemia: Secondary | ICD-10-CM

## 2016-04-20 DIAGNOSIS — M25532 Pain in left wrist: Secondary | ICD-10-CM

## 2016-04-20 DIAGNOSIS — S62661A Nondisplaced fracture of distal phalanx of left index finger, initial encounter for closed fracture: Secondary | ICD-10-CM | POA: Diagnosis present

## 2016-04-20 DIAGNOSIS — S62525A Nondisplaced fracture of distal phalanx of left thumb, initial encounter for closed fracture: Secondary | ICD-10-CM | POA: Diagnosis present

## 2016-04-20 DIAGNOSIS — E7800 Pure hypercholesterolemia, unspecified: Secondary | ICD-10-CM | POA: Diagnosis present

## 2016-04-20 DIAGNOSIS — M549 Dorsalgia, unspecified: Secondary | ICD-10-CM | POA: Diagnosis present

## 2016-04-20 DIAGNOSIS — S42021A Displaced fracture of shaft of right clavicle, initial encounter for closed fracture: Secondary | ICD-10-CM | POA: Diagnosis present

## 2016-04-20 DIAGNOSIS — M5126 Other intervertebral disc displacement, lumbar region: Secondary | ICD-10-CM

## 2016-04-20 DIAGNOSIS — F410 Panic disorder [episodic paroxysmal anxiety] without agoraphobia: Secondary | ICD-10-CM

## 2016-04-20 DIAGNOSIS — Z0000 Encounter for general adult medical examination without abnormal findings: Secondary | ICD-10-CM

## 2016-04-20 DIAGNOSIS — K5900 Constipation, unspecified: Secondary | ICD-10-CM | POA: Diagnosis present

## 2016-04-20 DIAGNOSIS — S22011A Stable burst fracture of first thoracic vertebra, initial encounter for closed fracture: Secondary | ICD-10-CM | POA: Diagnosis present

## 2016-04-20 DIAGNOSIS — R262 Difficulty in walking, not elsewhere classified: Secondary | ICD-10-CM

## 2016-04-20 DIAGNOSIS — M25511 Pain in right shoulder: Secondary | ICD-10-CM

## 2016-04-20 DIAGNOSIS — S6292XA Unspecified fracture of left wrist and hand, initial encounter for closed fracture: Secondary | ICD-10-CM | POA: Diagnosis present

## 2016-04-20 DIAGNOSIS — S2249XA Multiple fractures of ribs, unspecified side, initial encounter for closed fracture: Secondary | ICD-10-CM | POA: Diagnosis present

## 2016-04-20 DIAGNOSIS — S2241XA Multiple fractures of ribs, right side, initial encounter for closed fracture: Principal | ICD-10-CM | POA: Diagnosis present

## 2016-04-20 LAB — I-STAT CHEM 8, ED
BUN: 10 mg/dL (ref 6–20)
CREATININE: 1.2 mg/dL (ref 0.61–1.24)
Calcium, Ion: 1.11 mmol/L — ABNORMAL LOW (ref 1.15–1.40)
Chloride: 101 mmol/L (ref 101–111)
Glucose, Bld: 112 mg/dL — ABNORMAL HIGH (ref 65–99)
HEMATOCRIT: 47 % (ref 39.0–52.0)
HEMOGLOBIN: 16 g/dL (ref 13.0–17.0)
Potassium: 4.1 mmol/L (ref 3.5–5.1)
Sodium: 139 mmol/L (ref 135–145)
TCO2: 25 mmol/L (ref 0–100)

## 2016-04-20 LAB — PROTIME-INR
INR: 0.97
PROTHROMBIN TIME: 12.9 s (ref 11.4–15.2)

## 2016-04-20 LAB — COMPREHENSIVE METABOLIC PANEL
ALBUMIN: 4.4 g/dL (ref 3.5–5.0)
ALT: 38 U/L (ref 17–63)
AST: 53 U/L — AB (ref 15–41)
Alkaline Phosphatase: 94 U/L (ref 38–126)
Anion gap: 10 (ref 5–15)
BUN: 9 mg/dL (ref 6–20)
CHLORIDE: 104 mmol/L (ref 101–111)
CO2: 23 mmol/L (ref 22–32)
Calcium: 9.1 mg/dL (ref 8.9–10.3)
Creatinine, Ser: 1.03 mg/dL (ref 0.61–1.24)
GFR calc Af Amer: >60 mL/min (ref >60)
Glucose, Bld: 116 mg/dL — ABNORMAL HIGH (ref 65–99)
POTASSIUM: 4 mmol/L (ref 3.5–5.1)
Sodium: 137 mmol/L (ref 135–145)
Total Bilirubin: 0.6 mg/dL (ref 0.3–1.2)
Total Protein: 7.1 g/dL (ref 6.5–8.1)

## 2016-04-20 LAB — I-STAT CG4 LACTIC ACID, ED: Lactic Acid, Venous: 2.82 mmol/L (ref 0.5–1.9)

## 2016-04-20 LAB — CBC
HEMATOCRIT: 44.9 % (ref 39.0–52.0)
Hemoglobin: 15.3 g/dL (ref 13.0–17.0)
MCH: 31.4 pg (ref 26.0–34.0)
MCHC: 34.1 g/dL (ref 30.0–36.0)
MCV: 92.2 fL (ref 78.0–100.0)
Platelets: 284 10*3/uL (ref 150–400)
RBC: 4.87 MIL/uL (ref 4.22–5.81)
RDW: 13.3 % (ref 11.5–15.5)
WBC: 19.3 10*3/uL — AB (ref 4.0–10.5)

## 2016-04-20 MED ORDER — TETANUS-DIPHTH-ACELL PERTUSSIS 5-2.5-18.5 LF-MCG/0.5 IM SUSP: 0.5000 mL | Freq: Once | INTRAMUSCULAR | Status: DC

## 2016-04-20 MED ORDER — ONDANSETRON HCL 4 MG/2ML IJ SOLN
4.0000 mg | Freq: Once | INTRAMUSCULAR | Status: AC
  Administered 2016-04-20: 4 mg via INTRAVENOUS

## 2016-04-20 MED ORDER — FENTANYL CITRATE (PF) 100 MCG/2ML IJ SOLN
100.0000 ug | Freq: Once | INTRAMUSCULAR | Status: AC
  Administered 2016-04-20: 100 ug via INTRAVENOUS

## 2016-04-20 MED ORDER — HYDROMORPHONE HCL 1 MG/ML IJ SOLN
1.0000 mg | Freq: Once | INTRAMUSCULAR | Status: AC
  Administered 2016-04-21: 1 mg via INTRAVENOUS
  Filled 2016-04-20: qty 1

## 2016-04-20 MED ORDER — FENTANYL CITRATE (PF) 100 MCG/2ML IJ SOLN
INTRAMUSCULAR | Status: AC
  Filled 2016-04-20: qty 2

## 2016-04-20 MED ORDER — CYCLOBENZAPRINE HCL 10 MG PO TABS
10.0000 mg | ORAL_TABLET | Freq: Once | ORAL | Status: AC
  Administered 2016-04-21: 10 mg via ORAL
  Filled 2016-04-20: qty 1

## 2016-04-20 MED ORDER — IOPAMIDOL (ISOVUE-300) INJECTION 61%
INTRAVENOUS | Status: AC
  Administered 2016-04-20: 100 mL
  Filled 2016-04-20: qty 100

## 2016-04-20 MED ORDER — ONDANSETRON HCL 4 MG/2ML IJ SOLN
INTRAMUSCULAR | Status: AC
  Filled 2016-04-20: qty 2

## 2016-04-20 MED ORDER — HYDROCODONE-ACETAMINOPHEN 10-325 MG PO TABS: 1.0000 | ORAL_TABLET | ORAL | 0 refills | Status: DC | PRN

## 2016-04-20 MED ORDER — FENTANYL CITRATE (PF) 100 MCG/2ML IJ SOLN
100.0000 ug | Freq: Once | INTRAMUSCULAR | Status: AC
Start: 2016-04-20 — End: 2016-04-20
  Administered 2016-04-20: 100 ug via INTRAVENOUS

## 2016-04-20 MED ORDER — ONDANSETRON HCL 4 MG/2ML IJ SOLN
4.0000 mg | Freq: Once | INTRAMUSCULAR | Status: AC
  Administered 2016-04-21: 4 mg via INTRAVENOUS
  Filled 2016-04-20: qty 2
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
## 2016-04-20 NOTE — Progress Notes (Signed)
Patient: Dylan [name], Male    DOB: 07-24-1955, 60 y.o.   MRN: [id number] Visit Date: 04/20/2016  Today's Provider: [name], MD   Chief Complaint  Patient presents with  . Annual Exam  . Anxiety  . Depression  . Back Pain   Subjective:    Annual physical exam Dylan [name] is a 60 y.o. male who presents today for health maintenance and complete physical. He feels well. He reports not exercising. He reports he is sleeping fairly well.  ----------------------------------------------------------------  Panic attacks: From 02/29/2016-Did not tolerate 20mg  Lexapro, but significantly improved on 10mg . Will continue for now. Consider taking 15mg  if 10mg  becomes less effective.   Acute anxiety: From 02/29/2016-Improved, requiring fewer lorazepam.   Depression: From 02/29/2016-no changes.Reports mood has been pretty good. No adverse effects from Lexapro  Back pain, chronic: From 02/29/2016-Encourage to keep opiated to a minimum. Recommend frequent ice application and regular exercise.    Review of Systems  Constitutional: Negative.   HENT: Positive for ear pain, hearing loss, mouth sores and tinnitus.   Eyes: Negative.   Respiratory: Negative.   Cardiovascular: Negative.   Gastrointestinal: Negative.   Endocrine: Negative.   Genitourinary: Negative.   Musculoskeletal: Positive for back pain and neck pain.  Skin: Positive for rash.  Allergic/Immunologic: Negative.   Neurological: Positive for numbness.  Psychiatric/Behavioral: The patient is nervous/anxious.     Social History      He  reports that he quit smoking about 13 years ago. His smoking use included Cigarettes. He has a 30.00 pack-year smoking history. He has never used smokeless tobacco. He reports that he does not drink alcohol or use drugs.       Social History   Social History  . Marital status: Divorced    Spouse name: N/A  . Number of children: N/A  . Years of education: N/A   Social History  Main Topics  . Smoking status: Former Smoker    Packs/day: 1.00    Years: 30.00    Types: Cigarettes    Quit date: 07/18/2002  . Smokeless tobacco: Never Used  . Alcohol use No  . Drug use: No  . Sexual activity: Not Asked   Other Topics Concern  . None   Social History Narrative  . None    Past Medical History:  Diagnosis Date  . History of chicken pox   . History of measles as a child   . History of mumps as a child   . Seizures (HCC)    blacked out in shower when coming off benzo - 'withdrawals'  . Shortness of breath dyspnea    with exertion ? d/t stress & anxiety     Patient Active Problem List   Diagnosis Date Noted  . Frequent falls 08/28/2015  . Lumbar herniated disc 02/25/2015  . Bowel habit changes 01/12/2015  . Back pain, chronic 01/12/2015  . DDD (degenerative disc disease), cervical 01/12/2015  . Depression 01/12/2015  . Erectile dysfunction 01/12/2015  . Fracture of bones of trunk, closed 01/12/2015  . Headache 01/12/2015  . Hemorrhoid 01/12/2015  . History of tobacco use 01/12/2015  . Hypogonadism in male 01/12/2015  . Insomnia 01/12/2015  . Left knee pain 01/12/2015  . Obesity 01/12/2015  . Panic attacks 01/12/2015  . Shortness of breath on exertion 01/12/2015  . Anxiety 02/25/2008  . Hyperlipidemia, mixed 01/30/2008  . Essential (primary) hypertension 01/24/2008  . Fam hx-ischem heart disease 01/24/2008    Past Surgical  History:  Procedure Laterality Date  . BACK SURGERY    . KNEE SURGERY Right 1993  . LUMBAR DISC SURGERY  03/18/2015   R L4-5 micro discectomy, Dr. Lovell Sheehan  . LUMBAR LAMINECTOMY/DECOMPRESSION MICRODISCECTOMY Right 03/18/2015   Procedure: Right Lumbar Four-Five Microdiscectomy;  Surgeon: Tressie Stalker, MD;  Location: [facility];  Service: Neurosurgery;  Laterality: Right;  Right L45 microdiskectomy  . LUNG SURGERY Left 2009   Lung Collapse: left chest tube  . ROTATOR CUFF REPAIR  2004   had rotator cuff injury and  surgery was performed  . TONSILLECTOMY    . TONSILLECTOMY AND ADENOIDECTOMY  1960    Family History        Family Status  Relation Status  . Mother Deceased at age 30   Cause of Death: CVA with Alzheimers disease  . Father Deceased at age 15  . Sister Alive  . Brother Alive  . Sister Deceased at age 51   Cause of death: Congestive Heart Failure        His family history includes Alzheimer's disease in his mother; CAD in his brother; Congestive Heart Failure in his brother and sister; Heart disease in his father; Stroke in his mother.    Allergies  Allergen Reactions  . Belsomra  [Suvorexant]     Hallucinations  . Duloxetine     Nervous  . Meloxicam     Stomach cramps  . Morphine Other (See Comments)    Current Meds  Medication Sig  . aspirin 81 MG tablet Take 81 mg by mouth daily. Reported on 01/01/2016  . diphenhydramine-acetaminophen (TYLENOL PM) 25-500 MG TABS Take 1 tablet by mouth at bedtime as needed (pain). Reported on 01/01/2016  . escitalopram (LEXAPRO) 10 MG tablet Take 1 tablet (10 mg total) by mouth daily.  Marland Kitchen HYDROcodone-acetaminophen (NORCO) 10-325 MG tablet Take 1 tablet by mouth every 4 (four) hours as needed.  Marland Kitchen LORazepam (ATIVAN) 1 MG tablet take 1 tablet by mouth UP TO four times a day if needed for anxiety  . methocarbamol (ROBAXIN) 750 MG tablet Take 1-2 tablets (750-1,500 mg total) by mouth every 6 (six) hours as needed for muscle spasms.  . ranitidine (ZANTAC) 150 MG tablet Take 150 mg by mouth daily as needed for heartburn.  . simvastatin (ZOCOR) 40 MG tablet Take 1 tablet (40 mg total) by mouth daily.  . tamsulosin (FLOMAX) 0.4 MG CAPS capsule Take 1 capsule (0.4 mg total) by mouth daily.  . Triamcinolone Acetonide (NASACORT AQ NA) Place 1 spray into the nose daily as needed (allergies).  . zolpidem (AMBIEN CR) 12.5 MG CR tablet TAKE 1 TABLET BY MOUTH AT BEDTIME AS NEEDED FOR SLEEP    Patient Care Team: Malva Limes, MD as PCP - General (Family  Medicine) Tressie Stalker, MD as Consulting Physician (Neurosurgery)     Objective:   Vitals: BP 130/74 (BP Location: Right Arm, Patient Position: Sitting, Cuff Size: Large)   Pulse (!) 107   Temp 98.4 F (36.9 C) (Oral)   Resp 16   Ht 5\' 10"  (1.778 m)   Wt 203 lb (92.1 kg)   SpO2 97%   BMI 29.13 kg/m    Physical Exam   General Appearance:    Alert, cooperative, no distress, appears stated age  Head:    Normocephalic, without obvious abnormality, atraumatic  Eyes:    PERRL, conjunctiva/corneas clear, EOM's intact, fundi    benign, both eyes       Ears:  Normal TM's and external ear canals, both ears  Nose:   Nares normal, septum midline, mucosa normal, no drainage   or sinus tenderness  Throat:   Lips, mucosa, and tongue normal; teeth and gums normal  Neck:   Supple, symmetrical, trachea midline, no adenopathy;       thyroid:  No enlargement/tenderness/nodules; no carotid   bruit or JVD  Back:     Symmetric, no curvature, ROM normal, no CVA tenderness  Lungs:     Clear to auscultation bilaterally, respirations unlabored  Chest wall:    No tenderness or deformity  Heart:    Regular rate and rhythm, S1 and S2 normal, no murmur, rub   or gallop  Abdomen:     Soft, non-tender, bowel sounds active all four quadrants,    no masses, no organomegaly  Genitalia:    deferred  Rectal:    deferred  Extremities:   Extremities normal, atraumatic, no cyanosis or edema  Pulses:   2+ and symmetric all extremities  Skin:   Skin color, texture, turgor normal, no rashes or lesions  Lymph nodes:   Cervical, supraclavicular, and axillary nodes normal  Neurologic:   CNII-XII intact. Normal strength, sensation and reflexes      throughout    Depression Screen PHQ 2/9 Scores 04/20/2016 04/01/2015  PHQ - 2 Score 3 5  PHQ- 9 Score 18 20      Assessment & Plan:     Routine Health Maintenance and Physical Exam  Exercise Activities and Dietary recommendations Goals    None       Immunization History  Administered Date(s) Administered  . Influenza,inj,Quad PF,36+ Mos 04/01/2015  . Tdap 09/12/2008    Health Maintenance  Topic Date Due  . HIV Screening  09/18/1970  . COLONOSCOPY  09/17/2005  . ZOSTAVAX  09/18/2015  . TETANUS/TDAP  09/12/2018  . INFLUENZA VACCINE  Completed  . Hepatitis C Screening  Completed      Discussed health benefits of physical activity, and encouraged him to engage in regular exercise appropriate for his age and condition.    --------------------------------------------------------------------  1. Annual physical exam  - Comprehensive metabolic panel  2. Essential (primary) hypertension Stable, Continue current medications.   - EKG 12-Lead  3. Lumbar herniated disc   4. Panic attacks Well controlled. Continue current medications.    5. Hyperlipidemia, mixed  - Lipid panel - TSH  6. Need for influenza vaccination  - Flu Vaccine QUAD 36+ mos IM  7. Need for zoster vaccine   8. Prostate cancer screening -PSA  9. Chronic midline low back pain, with sciatica presence unspecified  - HYDROcodone-acetaminophen (NORCO) 10-325 MG tablet; Take 1 tablet by mouth every 4 (four) hours as needed.  Dispense: 120 tablet; Refill: 0    Mila Merryonald Fisher, MD  [facility]
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
## 2016-04-21 ENCOUNTER — Emergency Department (HOSPITAL_COMMUNITY): Payer: BLUE CROSS/BLUE SHIELD

## 2016-04-21 DIAGNOSIS — S62341A Nondisplaced fracture of base of second metacarpal bone. left hand, initial encounter for closed fracture: Secondary | ICD-10-CM

## 2016-04-21 DIAGNOSIS — S2249XA Multiple fractures of ribs, unspecified side, initial encounter for closed fracture: Secondary | ICD-10-CM

## 2016-04-21 DIAGNOSIS — S42001A Fracture of unspecified part of right clavicle, initial encounter for closed fracture: Secondary | ICD-10-CM

## 2016-04-21 DIAGNOSIS — S42024A Nondisplaced fracture of shaft of right clavicle, initial encounter for closed fracture: Secondary | ICD-10-CM

## 2016-04-21 DIAGNOSIS — G8929 Other chronic pain: Secondary | ICD-10-CM | POA: Diagnosis present

## 2016-04-21 DIAGNOSIS — S62255A Nondisplaced fracture of neck of first metacarpal bone, left hand, initial encounter for closed fracture: Secondary | ICD-10-CM | POA: Diagnosis not present

## 2016-04-21 HISTORY — DX: Multiple fractures of ribs, unspecified side, initial encounter for closed fracture: S22.49XA

## 2016-04-21 HISTORY — DX: Rider (driver) (passenger) of other motorcycle injured in unspecified traffic accident, initial encounter: V29.99XA

## 2016-04-21 HISTORY — DX: Fracture of unspecified part of right clavicle, initial encounter for closed fracture: S42.001A

## 2016-04-21 LAB — URINALYSIS, ROUTINE W REFLEX MICROSCOPIC
BILIRUBIN URINE: NEGATIVE
Glucose, UA: NEGATIVE mg/dL
Ketones, ur: 15 mg/dL — AB
Leukocytes, UA: NEGATIVE
Nitrite: NEGATIVE
Protein, ur: NEGATIVE mg/dL
SPECIFIC GRAVITY, URINE: 1.044 — AB (ref 1.005–1.030)
pH: 5.5 (ref 5.0–8.0)

## 2016-04-21 LAB — ETHANOL

## 2016-04-21 LAB — CBC
HEMATOCRIT: 44.9 % (ref 39.0–52.0)
HEMOGLOBIN: 15 g/dL (ref 13.0–17.0)
MCH: 31 pg (ref 26.0–34.0)
MCHC: 33.4 g/dL (ref 30.0–36.0)
MCV: 92.8 fL (ref 78.0–100.0)
Platelets: 288 10*3/uL (ref 150–400)
RBC: 4.84 MIL/uL (ref 4.22–5.81)
RDW: 13.5 % (ref 11.5–15.5)
WBC: 18.7 10*3/uL — ABNORMAL HIGH (ref 4.0–10.5)

## 2016-04-21 LAB — BASIC METABOLIC PANEL
Anion gap: 16 — ABNORMAL HIGH (ref 5–15)
BUN: 9 mg/dL (ref 6–20)
CALCIUM: 9.2 mg/dL (ref 8.9–10.3)
CHLORIDE: 100 mmol/L — AB (ref 101–111)
CO2: 22 mmol/L (ref 22–32)
CREATININE: 0.93 mg/dL (ref 0.61–1.24)
GFR calc Af Amer: >60 mL/min (ref >60)
GFR calc non Af Amer: >60 mL/min (ref >60)
Glucose, Bld: 138 mg/dL — ABNORMAL HIGH (ref 65–99)
Potassium: 4.1 mmol/L (ref 3.5–5.1)
Sodium: 138 mmol/L (ref 135–145)

## 2016-04-21 LAB — SAMPLE TO BLOOD BANK

## 2016-04-21 LAB — URINE MICROSCOPIC-ADD ON
BACTERIA UA: NONE SEEN
SQUAMOUS EPITHELIAL / LPF: NONE SEEN

## 2016-04-21 LAB — CDS SEROLOGY

## 2016-04-21 MED ORDER — SODIUM CHLORIDE 0.9 % IV SOLN
INTRAVENOUS | Status: DC
  Administered 2016-04-21: 05:00:00 via INTRAVENOUS

## 2016-04-21 MED ORDER — LORAZEPAM 1 MG PO TABS
1.0000 mg | ORAL_TABLET | Freq: Four times a day (QID) | ORAL | Status: DC | PRN
  Administered 2016-04-21 – 2016-04-23 (×6): 1 mg via ORAL
  Filled 2016-04-21 (×7): qty 1

## 2016-04-21 MED ORDER — HYDROCODONE-ACETAMINOPHEN 10-325 MG PO TABS
0.5000 | ORAL_TABLET | ORAL | Status: DC | PRN
  Administered 2016-04-21 – 2016-04-22 (×7): 2 via ORAL
  Administered 2016-04-23 (×3): 1 via ORAL
  Administered 2016-04-23: 2 via ORAL
  Administered 2016-04-24 (×3): 1 via ORAL
  Filled 2016-04-21 (×2): qty 2
  Filled 2016-04-21 (×3): qty 1
  Filled 2016-04-21: qty 2
  Filled 2016-04-21: qty 1
  Filled 2016-04-21 (×3): qty 2
  Filled 2016-04-21: qty 1
  Filled 2016-04-21 (×3): qty 2

## 2016-04-21 MED ORDER — METHOCARBAMOL 750 MG PO TABS
1500.0000 mg | ORAL_TABLET | Freq: Four times a day (QID) | ORAL | Status: DC
  Administered 2016-04-21: 750 mg via ORAL
  Administered 2016-04-21: 1500 mg via ORAL
  Filled 2016-04-21 (×2): qty 2

## 2016-04-21 MED ORDER — ONDANSETRON HCL 4 MG/2ML IJ SOLN: 4.0000 mg | Freq: Four times a day (QID) | INTRAMUSCULAR | Status: DC | PRN

## 2016-04-21 MED ORDER — ONDANSETRON HCL 4 MG PO TABS
4.0000 mg | ORAL_TABLET | Freq: Four times a day (QID) | ORAL | Status: DC | PRN
  Administered 2016-04-21 – 2016-04-23 (×2): 4 mg via ORAL
  Filled 2016-04-21 (×2): qty 1

## 2016-04-21 MED ORDER — PROMETHAZINE HCL 25 MG/ML IJ SOLN: 12.5000 mg | Freq: Three times a day (TID) | INTRAMUSCULAR | Status: DC | PRN

## 2016-04-21 MED ORDER — ENOXAPARIN SODIUM 40 MG/0.4ML ~~LOC~~ SOLN
40.0000 mg | SUBCUTANEOUS | Status: DC
  Filled 2016-04-21: qty 0.4

## 2016-04-21 MED ORDER — HYDROMORPHONE HCL 1 MG/ML IJ SOLN
0.5000 mg | INTRAMUSCULAR | Status: DC | PRN
  Administered 2016-04-21 – 2016-04-22 (×6): 0.5 mg via INTRAVENOUS
  Filled 2016-04-21 (×7): qty 1

## 2016-04-21 MED ORDER — SODIUM CHLORIDE 0.9% FLUSH: 10.0000 mL | INTRAVENOUS | Status: DC | PRN

## 2016-04-21 MED ORDER — HYDROMORPHONE HCL 1 MG/ML IJ SOLN
INTRAMUSCULAR | Status: AC
  Filled 2016-04-21: qty 1

## 2016-04-21 MED ORDER — HYDROCODONE-ACETAMINOPHEN 10-325 MG PO TABS: 1.0000 | ORAL_TABLET | Freq: Four times a day (QID) | ORAL | Status: DC | PRN

## 2016-04-21 MED ORDER — SODIUM CHLORIDE 0.9% FLUSH
10.0000 mL | Freq: Two times a day (BID) | INTRAVENOUS | Status: DC
  Administered 2016-04-21 – 2016-04-24 (×5): 10 mL

## 2016-04-21 MED ORDER — ENOXAPARIN SODIUM 40 MG/0.4ML ~~LOC~~ SOLN: 40.0000 mg | SUBCUTANEOUS | Status: DC

## 2016-04-21 MED ORDER — ESCITALOPRAM OXALATE 10 MG PO TABS
10.0000 mg | ORAL_TABLET | Freq: Every day | ORAL | Status: DC
Start: 2016-04-21 — End: 2016-04-24
  Administered 2016-04-21 – 2016-04-24 (×4): 10 mg via ORAL
  Filled 2016-04-21 (×4): qty 1

## 2016-04-21 MED ORDER — METHOCARBAMOL 750 MG PO TABS: 750.0000 mg | ORAL_TABLET | Freq: Three times a day (TID) | ORAL | Status: DC

## 2016-04-21 MED ORDER — PROMETHAZINE HCL 25 MG/ML IJ SOLN
12.5000 mg | Freq: Three times a day (TID) | INTRAMUSCULAR | Status: DC | PRN
  Administered 2016-04-21: 12.5 mg via INTRAVENOUS
  Filled 2016-04-21: qty 1

## 2016-04-21 MED ORDER — ACETAMINOPHEN 325 MG PO TABS
650.0000 mg | ORAL_TABLET | Freq: Once | ORAL | Status: AC
  Administered 2016-04-21: 650 mg via ORAL
  Filled 2016-04-21: qty 2

## 2016-04-21 MED ORDER — NAPROXEN 250 MG PO TABS
500.0000 mg | ORAL_TABLET | Freq: Two times a day (BID) | ORAL | Status: DC
  Administered 2016-04-21 – 2016-04-24 (×7): 500 mg via ORAL
  Filled 2016-04-21 (×7): qty 2

## 2016-04-21 MED ORDER — SIMVASTATIN 40 MG PO TABS
40.0000 mg | ORAL_TABLET | Freq: Every day | ORAL | Status: DC
  Administered 2016-04-21 – 2016-04-23 (×3): 40 mg via ORAL
  Filled 2016-04-21 (×3): qty 1

## 2016-04-21 MED ORDER — OXYCODONE HCL 5 MG PO TABS
5.0000 mg | ORAL_TABLET | ORAL | Status: DC | PRN
  Administered 2016-04-21: 10 mg via ORAL
  Filled 2016-04-21: qty 2

## 2016-04-21 MED ORDER — SODIUM CHLORIDE 0.9 % IV BOLUS (SEPSIS)
1000.0000 mL | Freq: Once | INTRAVENOUS | Status: AC
  Administered 2016-04-21: 1000 mL via INTRAVENOUS

## 2016-04-21 MED ORDER — HYDROMORPHONE HCL 1 MG/ML IJ SOLN
1.0000 mg | Freq: Once | INTRAMUSCULAR | Status: AC
  Administered 2016-04-21: 1 mg via INTRAVENOUS
  Filled 2016-04-21: qty 1

## 2016-04-21 MED ORDER — ENOXAPARIN SODIUM 30 MG/0.3ML ~~LOC~~ SOLN
30.0000 mg | Freq: Two times a day (BID) | SUBCUTANEOUS | Status: DC
  Administered 2016-04-21 – 2016-04-24 (×7): 30 mg via SUBCUTANEOUS
  Filled 2016-04-21 (×7): qty 0.3
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
## 2016-04-21 NOTE — Progress Notes (Signed)
1400 Pt was sitting in the recliner for 2 hrs. Assisted back to bed, w/ standby assist. Walked around the bed with steady gait. Right clavicle strap on.

## 2016-04-21 NOTE — Progress Notes (Signed)
Orthopedic Tech Progress Note Patient Details:  Dylan [name] 02/19/1956 [id number]  Ortho Devices Type of Ortho Device: Finger splint, Thumb velcro splint Ortho Device/Splint Location: Finger Splint Lt index finger (not past PIP joint), Velcro Thumb spica splint Lt, Ortho Device/Splint Interventions: Ordered, Application   Clois Dupesvery S Tsutomu Barfoot 04/21/2016, 9:08 PM
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
## 2016-04-21 NOTE — Progress Notes (Signed)
Orthopedic Tech Progress Note Patient Details:  Dylan CopasBarry [name] 10/30/1955 [id number]  Ortho Devices Type of Ortho Device: Clavicle strap Ortho Device/Splint Location: shoulders/back Ortho Device/Splint Interventions: Application   [name] 04/21/2016, 11:01 AM
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
## 2016-04-21 NOTE — Progress Notes (Signed)
Orthopedic Tech Progress Note Patient Details:  Irving CopasBarry Macchi 06/23/1956 [id number]  Patient ID: Irving CopasBarry Amrein, male   DOB: 03/08/1956, 60 y.o.   MRN: [id number]   Nikki DomCrawford, Micayla Brathwaite 04/21/2016, 11:02 AM clavical strap; viewed order from doctor's order list

## 2016-04-21 NOTE — Progress Notes (Signed)
PT Cancellation Note  Patient Details Name: Dylan CopasBarry Joung MRN: [id number] DOB: 11/18/1955   Cancelled Treatment:    Reason Eval/Treat Not Completed: Patient declined, no reason specified. Pt refusing to participate in PT evaluation at this time as he stated "All I want to do right now is rest. I haven't been able to rest since yesterday." PT will continue to f/u with pt as appropriate.   Alessandra BevelsJennifer M Chanah Tidmore 04/21/2016, 3:44 PM Deborah ChalkJennifer Micholas Drumwright, PT, DPT [phone]
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
## 2016-04-21 NOTE — ED Notes (Signed)
Misty StanleyLisa (friend) [phone]

## 2016-04-22 DIAGNOSIS — S22009A Unspecified fracture of unspecified thoracic vertebra, initial encounter for closed fracture: Secondary | ICD-10-CM | POA: Diagnosis present

## 2016-04-22 DIAGNOSIS — E7800 Pure hypercholesterolemia, unspecified: Secondary | ICD-10-CM | POA: Diagnosis present

## 2016-04-22 DIAGNOSIS — M549 Dorsalgia, unspecified: Secondary | ICD-10-CM | POA: Diagnosis present

## 2016-04-22 DIAGNOSIS — S22011A Stable burst fracture of first thoracic vertebra, initial encounter for closed fracture: Secondary | ICD-10-CM | POA: Diagnosis present

## 2016-04-22 DIAGNOSIS — S6292XA Unspecified fracture of left wrist and hand, initial encounter for closed fracture: Secondary | ICD-10-CM | POA: Diagnosis present

## 2016-04-22 DIAGNOSIS — S2241XA Multiple fractures of ribs, right side, initial encounter for closed fracture: Secondary | ICD-10-CM | POA: Diagnosis present

## 2016-04-22 DIAGNOSIS — Z885 Allergy status to narcotic agent status: Secondary | ICD-10-CM | POA: Diagnosis not present

## 2016-04-22 DIAGNOSIS — S42021A Displaced fracture of shaft of right clavicle, initial encounter for closed fracture: Secondary | ICD-10-CM | POA: Diagnosis present

## 2016-04-22 DIAGNOSIS — S62525A Nondisplaced fracture of distal phalanx of left thumb, initial encounter for closed fracture: Secondary | ICD-10-CM | POA: Diagnosis present

## 2016-04-22 DIAGNOSIS — K5900 Constipation, unspecified: Secondary | ICD-10-CM | POA: Diagnosis present

## 2016-04-22 DIAGNOSIS — G8929 Other chronic pain: Secondary | ICD-10-CM | POA: Diagnosis present

## 2016-04-22 DIAGNOSIS — S62661A Nondisplaced fracture of distal phalanx of left index finger, initial encounter for closed fracture: Secondary | ICD-10-CM | POA: Diagnosis present

## 2016-04-22 DIAGNOSIS — I10 Essential (primary) hypertension: Secondary | ICD-10-CM | POA: Diagnosis present

## 2016-04-22 HISTORY — DX: Unspecified fracture of left wrist and hand, initial encounter for closed fracture: S62.92XA

## 2016-04-22 MED ORDER — OXYCODONE HCL ER 10 MG PO T12A
10.0000 mg | EXTENDED_RELEASE_TABLET | Freq: Two times a day (BID) | ORAL | Status: DC
  Administered 2016-04-22 – 2016-04-24 (×5): 10 mg via ORAL
  Filled 2016-04-22 (×5): qty 1

## 2016-04-22 MED ORDER — METHOCARBAMOL 500 MG PO TABS
1000.0000 mg | ORAL_TABLET | Freq: Four times a day (QID) | ORAL | Status: DC
  Administered 2016-04-22 – 2016-04-24 (×5): 1000 mg via ORAL
  Filled 2016-04-22 (×4): qty 2

## 2016-04-22 NOTE — Evaluation (Signed)
Physical Therapy Evaluation Patient Details Name: Dylan [name] MRN: [id number] DOB: [birth year] Today's Date: 04/22/2016   History of Present Illness  pt presents post [facility] sustaining R Clavicle fx, R Rib fxs, L thumb and index fingers fxs.  pt with hx of Back surgery, chronic back pain, and HTN.    Clinical Impression  Pt very agreeable to mobility, but is limited by pain in R shoulder.  Pt demonstrates good awareness of safety during mobility and feel he will continue to progress once pain better tolerated.  Pt would benefit from a sling to R UE during mobility to support R UE instead of it hanging dependently.  Will continue to follow to trial stairs.      Follow Up Recommendations Home health PT;Supervision - Intermittent    Equipment Recommendations  3in1 (PT)    Recommendations for Other Services       Precautions / Restrictions Precautions Precautions: Fall Precaution Comments: Requested sling for R UE.   Required Braces or Orthoses: Other Brace/Splint Other Brace/Splint: pt with L wrist brace with thumb and index finger splints.  pt also wearing clavicle support. Restrictions Weight Bearing Restrictions: Yes RUE Weight Bearing: Non weight bearing LUE Weight Bearing: Weight bearing as tolerated      Mobility  Bed Mobility Overal bed mobility: Needs Assistance Bed Mobility: Supine to Sit     Supine to sit: Min guard     General bed mobility comments: pt requested PT leave up L lower bed rail which pt used to pull himself up to sitting and then requested PT to put down rail and he scooted himself to EOB.  cues for not using R UE.  pt states he plans on sleeping in his recliner at home.    Transfers Overall transfer level: Needs assistance Equipment used: None Transfers: Sit to/from Stand Sit to Stand: Min guard         General transfer comment: pt with pain during transfer, but did well maintaining R UE NWBing.    Ambulation/Gait Ambulation/Gait assistance: Min  guard Ambulation Distance (Feet): 500 Feet Assistive device: None Gait Pattern/deviations: Step-through pattern;Decreased stride length     General Gait Details: pt moves slowly and with occasional need to stop due to pain in R shoulder and R ribs.    Stairs            Wheelchair Mobility    Modified Rankin (Stroke Patients Only)       Balance Overall balance assessment: Needs assistance Sitting-balance support: No upper extremity supported;Feet supported Sitting balance-Leahy Scale: Fair     Standing balance support: No upper extremity supported;During functional activity Standing balance-Leahy Scale: Fair                               Pertinent Vitals/Pain Pain Assessment: 0-10 Pain Score: 8  Pain Location: R shoulder Pain Descriptors / Indicators: Sharp;Sore Pain Intervention(s): Monitored during session;Premedicated before session;Repositioned;Patient requesting pain meds-RN notified    Home Living Family/patient expects to be discharged to:: Private residence Living Arrangements: Children (son) Available Help at Discharge: Family;Available PRN/intermittently (only alone for a few hours in the early evening.) Type of Home: Mobile home Home Access: Stairs to enter Entrance Stairs-Rails: Right;Left;Can reach both Entrance Stairs-Number of Steps: 6 Home Layout: One level Home Equipment: Shower seat      Prior Function Level of Independence: Independent  Hand Dominance   Dominant Hand: Right    Extremity/Trunk Assessment   Upper Extremity Assessment: Defer to OT evaluation           Lower Extremity Assessment: Overall WFL for tasks assessed      Cervical / Trunk Assessment: Kyphotic (pt holding himself flexed due to pain in R ribs and shoulder)  Communication   Communication: No difficulties  Cognition Arousal/Alertness: Awake/alert Behavior During Therapy: WFL for tasks assessed/performed Overall Cognitive  Status: Within Functional Limits for tasks assessed                      General Comments      Exercises     Assessment/Plan    PT Assessment Patient needs continued PT services  PT Problem List Decreased activity tolerance;Decreased balance;Decreased mobility;Decreased knowledge of use of DME;Pain          PT Treatment Interventions DME instruction;Gait training;Stair training;Functional mobility training;Therapeutic activities;Therapeutic exercise;Balance training;Patient/family education    PT Goals (Current goals can be found in the Care Plan section)  Acute Rehab PT Goals Patient Stated Goal: To do as much as he can for himself.   PT Goal Formulation: With patient Time For Goal Achievement: 04/29/16 Potential to Achieve Goals: Good    Frequency Min 5X/week   Barriers to discharge        Co-evaluation               End of Session Equipment Utilized During Treatment: Gait belt Activity Tolerance: Patient limited by pain Patient left: in chair;with call bell/phone within reach Nurse Communication: Mobility status    Functional Assessment Tool Used: Clinical Judgement Functional Limitation: Mobility: Walking and moving around Mobility: Walking and Moving Around Current Status (J4782(G8978): At least 1 percent but less than 20 percent impaired, limited or restricted Mobility: Walking and Moving Around Goal Status [phone](G8979): 0 percent impaired, limited or restricted    Time: 1321-1355 PT Time Calculation (min) (ACUTE ONLY): 34 min   Charges:   PT Evaluation $PT Eval Moderate Complexity: 1 Procedure PT Treatments $Gait Training: 8-22 mins   PT G Codes:   PT G-Codes **NOT FOR INPATIENT CLASS** Functional Assessment Tool Used: Clinical Judgement Functional Limitation: Mobility: Walking and moving around Mobility: Walking and Moving Around Current Status (H0865(G8978): At least 1 percent but less than 20 percent impaired, limited or restricted Mobility: Walking  and Moving Around Goal Status [phone](G8979): 0 percent impaired, limited or restricted    Sunny SchleinRitenour, Zadrian Mccauley F, [place]PT [phone] 04/22/2016, 2:12 PM

## 2016-04-22 NOTE — Progress Notes (Signed)
Patient ID: Dylan [name], male   DOB: 07/20/1955, 60 y.o.   MRN: [id number]   LOS: 0 days   Subjective: No new c/o, still having significant pain, using Dilaudid regularly   Objective: Vital signs in last 24 hours: Temp:  [97.7 F (36.5 C)-98.9 F (37.2 C)] 98.2 F (36.8 C) (10/06 0528) Pulse Rate:  [69-84] 78 (10/06 0528) Resp:  [18] 18 (10/06 0528) BP: (117-131)/(59-68) 117/60 (10/06 0528) SpO2:  [92 %-95 %] 92 % (10/06 0528) Last BM Date: 04/20/16   IS: 2000ml (+57900ml)   Physical Exam General appearance: alert and no distress Resp: clear to auscultation bilaterally Cardio: regular rate and rhythm GI: normal findings: bowel sounds normal and soft, non-tender   Assessment/Plan: [facility] Right clav fx -- Sling, NWB per Dr. Roda ShuttersXu T1 TVP fx Multiple right rib fxs -- Pulmonary toilet Left hand fxs -- Non-operative, splint, WBAT per Dr. Roda ShuttersXu Chronic pain -- Will complicate pain management FEN -- Add Oxycontin VTE -- SCD's, Lovenox Dispo -- PT/OT, pain control    Freeman CaldronMichael J. Rontae Inglett, PA-C Pager: [phone] General Trauma PA Pager: [phone]  04/22/2016

## 2016-04-22 NOTE — Care Management Note (Signed)
Case Management Note  Patient Details  Name: Dylan [name] MRN: [id number] Date of Birth: 1956-01-31  Subjective/Objective: Pt for likely dc on 04/23/16 with family.  PT/OT recommending [facility] follow up DME.                     Action/Plan: Met with pt to discuss discharge arrangements.  Pt agreeable to [facility] follow-up; chooses [facility] for [facility] needs.  States would like 3 in 1 for home; referral to [facility] for DME needs.    Expected Discharge Date:    04/23/2016              Expected Discharge Plan:  [place]  In-House Referral:     Discharge planning Services  CM Consult  Post Acute Care Choice:  Durable Medical Equipment, Home Health Choice offered to:     DME Arranged:  3-N-1 DME Agency:  [place] Arranged:  PT, OT [facility] Agency:  [place]  Status of Service:  Completed, signed off  If discussed at [place] of Stay Meetings, dates discussed:    Additional Comments:  Reinaldo Raddle, RN, BSN  Trauma/Neuro ICU Case Manager [phone]

## 2016-04-22 NOTE — Progress Notes (Signed)
Orthopedic Tech Progress Note Patient Details:  Dylan CopasBarry [name] 01/14/1956 [id number]  Ortho Devices Type of Ortho Device: Arm sling Ortho Device/Splint Location: rue Ortho Device/Splint Interventions: Application   Nikki DomCrawford, Drayce Tawil 04/22/2016, 4:32 PM

## 2016-04-23 MED ORDER — SENNA 8.6 MG PO TABS
1.0000 | ORAL_TABLET | Freq: Two times a day (BID) | ORAL | Status: DC
  Administered 2016-04-23: 8.6 mg via ORAL
  Filled 2016-04-23 (×3): qty 1

## 2016-04-23 MED ORDER — POLYETHYLENE GLYCOL 3350 17 G PO PACK
17.0000 g | PACK | Freq: Three times a day (TID) | ORAL | Status: AC
  Administered 2016-04-23 (×2): 17 g via ORAL
  Filled 2016-04-23 (×2): qty 1

## 2016-04-23 NOTE — Progress Notes (Signed)
  Subjective: Complains of nausea without vomiting and constipation. Otherwise stable Still complains of right clavicle and right chest wall pain. Using Dilaudid  Objective: Vital signs in last 24 hours: Temp:  [97.8 F (36.6 C)-98 F (36.7 C)] 97.8 F (36.6 C) (10/06 2102) Pulse Rate:  [77-80] 77 (10/06 2102) Resp:  [18] 18 (10/06 2102) BP: (127-150)/(69-71) 150/69 (10/06 2102) SpO2:  [92 %-95 %] 95 % (10/06 2102) Last BM Date: 04/20/16  Intake/Output from previous day: 10/06 0701 - 10/07 0700 In: 970 [P.O.:960; I.V.:10] Out: 550 [Urine:550] Intake/Output this shift: Total I/O In: -  Out: 550 [Urine:550]   General appearance: alert and no distress Resp: clear to auscultation bilaterally... No rhonchi or wheeze.  Right chest wall tender Cardio: regular rate and rhythm GI: normal findings: bowel sounds normal and soft, non-tender Extremities: Right arm in sling.  Left hand in splint.   Lab Results:   Recent Labs  04/20/16 2204 04/21/16 0412  WBC 19.3* 18.7*  HGB 15.3 15.0  HCT 44.9 44.9  PLT 284 288   BMET  Recent Labs  04/20/16 2204 04/21/16 0412  NA 137 138  K 4.0 4.1  CL 104 100*  CO2 23 22  GLUCOSE 116* 138*  BUN 9 9  CREATININE 1.03 0.93  CALCIUM 9.1 9.2   PT/INR  Recent Labs  04/20/16 2204  LABPROT 12.9  INR 0.97   ABG No results for input(s): PHART, HCO3 in the last 72 hours.  Invalid input(s): PCO2, PO2  Studies/Results: No results found.  Anti-infectives: Anti-infectives    None      Assessment/Plan:   [facility] Right clav fx -- Sling, NWB per Dr. Roda ShuttersXu T1 TVP fx Multiple right rib fxs-- Pulmonary toilet.  No apparent complication Left hand fxs -- Non-operative, splint, WBAT per Dr. Roda ShuttersXu Chronic pain -- Will complicate pain management FEN-- Add Oxycontin.  Discontinue Dilaudid to see how he does Constipation and nausea-MiraLAX and Senokot to start this morning.  VTE-- SCD's, Lovenox Dispo-- PT/OT, pain control.  Home  today or tomorrow   LOS: 1 day    Reizel Calzada M 04/23/2016

## 2016-04-23 NOTE — Progress Notes (Signed)
Physical Therapy Treatment Patient Details Name: Dylan [name] MRN: [id number] DOB: 1956/02/09 Today's Date: 04/23/2016    History of Present Illness pt presents post [facility] sustaining R Clavicle fx, R Rib fxs, L thumb and index fingers fxs.  pt with hx of Back surgery, chronic back pain, and HTN.      PT Comments    Pt feels worse today than yesterday.  He reports he has not slept, more dizzy and lightheaded on his feet (despite orthostatic BPs being negative), quick gross vestibular screen without any (+) signs of vertigo may need to assess BPPV if he continues to report dizziness, but pt believe he is just getting too much pain meds.  PT will continue to follow acutely for d/c progression.    Follow Up Recommendations  Home health PT;Supervision - Intermittent     Equipment Recommendations  3in1 (PT)    Recommendations for Other Services   NA     Precautions / Restrictions Precautions Precautions: Fall Required Braces or Orthoses: Sling;Other Brace/Splint (sling for comfort when up) Other Brace/Splint: pt with L wrist brace with thumb and index finger splints.  pt also wearing clavicle support. Restrictions RUE Weight Bearing: Non weight bearing LUE Weight Bearing: Weight bearing as tolerated    Mobility  Bed Mobility Overal bed mobility: Needs Assistance Bed Mobility: Supine to Sit     Supine to sit: Min assist;HOB elevated     General bed mobility comments: Min assist to support trunk during transitions, came out of left side of bed to try to discourage WB through right arm (in sling).  Pt with heavy use of railings to pull up to sit.  I asked how he was going to manage this at home and he reported "I will sleep in my recliner chair"  Transfers Overall transfer level: Needs assistance Equipment used: 1 person hand held assist Transfers: Sit to/from Stand Sit to Stand: Min guard         General transfer comment: Min guard assist for safety, pt leaning legs against bed  for balance and stability when going to stand.   Ambulation/Gait Ambulation/Gait assistance: Min guard Ambulation Distance (Feet): 20 Feet Assistive device: 1 person hand held assist Gait Pattern/deviations: Step-through pattern;Staggering left;Staggering right Gait velocity: decreased Gait velocity interpretation: Below normal speed for age/gender General Gait Details: mildly staggering gait pattern, reports of lightheadedness when on his feet.  Able to stand to urinate in restroom, but did not feel like he could go for a walk.       Balance Overall balance assessment: Needs assistance Sitting-balance support: Single extremity supported Sitting balance-Leahy Scale: Fair     Standing balance support: Single extremity supported Standing balance-Leahy Scale: Fair                      Cognition Arousal/Alertness: Awake/alert Behavior During Therapy: WFL for tasks assessed/performed Overall Cognitive Status: Within Functional Limits for tasks assessed                         General Comments General comments (skin integrity, edema, etc.): Orthostatic vitals taken and were negative, occlomotor assessment preformed and was negative for issues, and pt didn't seem to have any nystagmus with transitions (did not formally assess for BPPV).       Pertinent Vitals/Pain Pain Assessment: 0-10 Pain Score: 8  Pain Location: right shoulder and right shoulder blade Pain Descriptors / Indicators: Aching;Burning Pain Intervention(s): Limited activity within patient's tolerance;Monitored  during session;Repositioned           PT Goals (current goals can now be found in the care plan section) Acute Rehab PT Goals Patient Stated Goal: To do as much as he can for himself.   Progress towards PT goals: Not progressing toward goals - comment (pt feels worse today)    Frequency    Min 5X/week      PT Plan Current plan remains appropriate       End of Session Equipment  Utilized During Treatment: Other (comment) (right arm sling, left arm/finger splint, clavicular brace) Activity Tolerance: Patient limited by pain;Other (comment) (lightheadedness) Patient left: in chair;with call bell/phone within reach     Time: 1300-1331 PT Time Calculation (min) (ACUTE ONLY): 31 min  Charges:  $Therapeutic Activity: 23-37 mins            Quintel Mccalla B. Elfego Giammarino, PT, DPT [phone]   04/23/2016, 2:27 PM

## 2016-04-24 MED ORDER — HYDROCODONE-ACETAMINOPHEN 5-325 MG PO TABS: 1.0000 | ORAL_TABLET | ORAL | 0 refills | Status: DC | PRN

## 2016-04-24 NOTE — Progress Notes (Signed)
Discharge instructions gone over with patient. Prescription given. Home medications discussed. Follow up appointment to be made. Diet, activity, and reasons to call the doctor discussed. Bowel regimen gone over. Safety points at home discussed. Signs and symptoms of infection gone over. Patient verbalized understanding of instructions.
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
## 2016-04-24 NOTE — Discharge Instructions (Signed)
-  see above 

## 2016-04-24 NOTE — Progress Notes (Signed)
3N1 not at bedside per RN. Cm called [facility] for DME 3N1 to be delivered at this time.

## 2016-04-25 ENCOUNTER — Encounter: Payer: Self-pay | Admitting: Family Medicine

## 2016-04-27 ENCOUNTER — Telehealth: Payer: Self-pay | Admitting: Family Medicine
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
## 2016-05-06 ENCOUNTER — Encounter: Payer: Self-pay | Admitting: Family Medicine

## 2016-05-06 ENCOUNTER — Ambulatory Visit (INDEPENDENT_AMBULATORY_CARE_PROVIDER_SITE_OTHER): Payer: BLUE CROSS/BLUE SHIELD | Admitting: Family Medicine

## 2016-05-06 VITALS — BP 144/82 | HR 112 | Temp 98.2°F | Resp 24 | Wt 205.0 lb

## 2016-05-06 DIAGNOSIS — Z2911 Encounter for prophylactic immunotherapy for respiratory syncytial virus (RSV): Secondary | ICD-10-CM | POA: Diagnosis not present

## 2016-05-06 DIAGNOSIS — Z23 Encounter for immunization: Secondary | ICD-10-CM

## 2016-05-06 DIAGNOSIS — S42001A Fracture of unspecified part of right clavicle, initial encounter for closed fracture: Secondary | ICD-10-CM

## 2016-05-06 MED ORDER — NAPROXEN 500 MG PO TABS: 500.0000 mg | ORAL_TABLET | Freq: Two times a day (BID) | ORAL | 0 refills | Status: DC

## 2016-05-06 MED ORDER — OXYCODONE-ACETAMINOPHEN 10-325 MG PO TABS: 1.0000 | ORAL_TABLET | Freq: Three times a day (TID) | ORAL | 0 refills | Status: DC | PRN
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
## 2016-05-09 ENCOUNTER — Encounter (INDEPENDENT_AMBULATORY_CARE_PROVIDER_SITE_OTHER): Payer: Self-pay | Admitting: Orthopaedic Surgery

## 2016-05-09 ENCOUNTER — Ambulatory Visit (INDEPENDENT_AMBULATORY_CARE_PROVIDER_SITE_OTHER): Payer: BLUE CROSS/BLUE SHIELD

## 2016-05-09 ENCOUNTER — Ambulatory Visit (INDEPENDENT_AMBULATORY_CARE_PROVIDER_SITE_OTHER): Payer: BLUE CROSS/BLUE SHIELD | Admitting: Orthopaedic Surgery

## 2016-05-09 DIAGNOSIS — S42024D Nondisplaced fracture of shaft of right clavicle, subsequent encounter for fracture with routine healing: Secondary | ICD-10-CM | POA: Diagnosis not present

## 2016-05-09 DIAGNOSIS — S62631D Displaced fracture of distal phalanx of left index finger, subsequent encounter for fracture with routine healing: Secondary | ICD-10-CM

## 2016-05-09 DIAGNOSIS — S62522D Displaced fracture of distal phalanx of left thumb, subsequent encounter for fracture with routine healing: Secondary | ICD-10-CM
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
## 2016-05-18 ENCOUNTER — Other Ambulatory Visit: Payer: Self-pay | Admitting: Family Medicine

## 2016-05-18 DIAGNOSIS — S42001A Fracture of unspecified part of right clavicle, initial encounter for closed fracture: Secondary | ICD-10-CM

## 2016-05-18 MED ORDER — OXYCODONE-ACETAMINOPHEN 10-325 MG PO TABS: 1.0000 | ORAL_TABLET | Freq: Three times a day (TID) | ORAL | 0 refills | Status: DC | PRN

## 2016-05-27 ENCOUNTER — Ambulatory Visit (INDEPENDENT_AMBULATORY_CARE_PROVIDER_SITE_OTHER): Payer: Medicare Other | Admitting: Family Medicine

## 2016-05-27 VITALS — BP 120/70 | HR 83 | Temp 98.1°F | Resp 16 | Ht 70.0 in | Wt 207.0 lb

## 2016-05-27 DIAGNOSIS — S42001A Fracture of unspecified part of right clavicle, initial encounter for closed fracture: Secondary | ICD-10-CM | POA: Diagnosis not present

## 2016-05-27 DIAGNOSIS — F419 Anxiety disorder, unspecified: Secondary | ICD-10-CM

## 2016-05-27 MED ORDER — OXYCODONE-ACETAMINOPHEN 10-325 MG PO TABS: 1.0000 | ORAL_TABLET | Freq: Three times a day (TID) | ORAL | 0 refills | Status: AC | PRN
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
## 2016-05-30 ENCOUNTER — Encounter (INDEPENDENT_AMBULATORY_CARE_PROVIDER_SITE_OTHER): Payer: Self-pay | Admitting: Orthopaedic Surgery

## 2016-05-30 ENCOUNTER — Ambulatory Visit (INDEPENDENT_AMBULATORY_CARE_PROVIDER_SITE_OTHER): Payer: Medicare Other

## 2016-05-30 ENCOUNTER — Ambulatory Visit (INDEPENDENT_AMBULATORY_CARE_PROVIDER_SITE_OTHER): Payer: Medicare Other | Admitting: Orthopaedic Surgery

## 2016-05-30 DIAGNOSIS — S6292XD Unspecified fracture of left wrist and hand, subsequent encounter for fracture with routine healing: Secondary | ICD-10-CM | POA: Diagnosis not present

## 2016-05-30 DIAGNOSIS — S42001D Fracture of unspecified part of right clavicle, subsequent encounter for fracture with routine healing: Secondary | ICD-10-CM | POA: Diagnosis not present
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
## 2016-06-01 ENCOUNTER — Telehealth: Payer: Self-pay | Admitting: Family Medicine

## 2016-06-01 NOTE — Telephone Encounter (Signed)
Please advise 

## 2016-06-01 NOTE — Telephone Encounter (Signed)
Pt called saying the pharmacy will not fill his RX for Oxycodone until Monday.  He said they will not fill until Monday and he will be out Saturday.  The pharmacy needs verbal ok to fill before Monday.  Please advice patient as to what you are going to do.  [place] [facility] [place].  Thank sTeri

## 2016-06-02 NOTE — Telephone Encounter (Signed)
Spoke with pharmacist on phone and gave verbal to fill medication by 11/18, she wanted to advise you that you had sent in a early refill request for this medication to be filled early last month as well. Pharmacist states that this prescription was filled early on 11/3 with a qty of 100 tablets. I called patient and left voicemail notifying him that Rx can be filled as early as 11/18. KW

## 2016-06-02 NOTE — Telephone Encounter (Signed)
Please advise pharmacy that it is OK to fill prescription for oxycodone/apap on Saturday November 18th.

## 2016-06-17 ENCOUNTER — Other Ambulatory Visit: Payer: Self-pay | Admitting: Family Medicine

## 2016-06-17 ENCOUNTER — Encounter: Payer: Self-pay | Admitting: Family Medicine

## 2016-06-17 DIAGNOSIS — G8929 Other chronic pain: Secondary | ICD-10-CM

## 2016-06-17 DIAGNOSIS — M545 Low back pain: Principal | ICD-10-CM

## 2016-06-17 MED ORDER — HYDROCODONE-ACETAMINOPHEN 10-325 MG PO TABS: 1.0000 | ORAL_TABLET | ORAL | 0 refills | Status: DC | PRN

## 2016-06-24 ENCOUNTER — Ambulatory Visit: Payer: Medicare Other | Admitting: Family Medicine

## 2016-06-27 ENCOUNTER — Ambulatory Visit (INDEPENDENT_AMBULATORY_CARE_PROVIDER_SITE_OTHER): Payer: Medicare Other | Admitting: Family Medicine

## 2016-06-27 ENCOUNTER — Encounter: Payer: Self-pay | Admitting: Family Medicine

## 2016-06-27 VITALS — BP 132/82 | HR 82 | Temp 98.2°F | Resp 16 | Wt 209.0 lb

## 2016-06-27 DIAGNOSIS — S22009D Unspecified fracture of unspecified thoracic vertebra, subsequent encounter for fracture with routine healing: Secondary | ICD-10-CM | POA: Diagnosis not present

## 2016-06-27 DIAGNOSIS — M545 Low back pain: Secondary | ICD-10-CM

## 2016-06-27 DIAGNOSIS — S42001A Fracture of unspecified part of right clavicle, initial encounter for closed fracture: Secondary | ICD-10-CM

## 2016-06-27 DIAGNOSIS — G8929 Other chronic pain: Secondary | ICD-10-CM | POA: Diagnosis not present

## 2016-06-27 DIAGNOSIS — F410 Panic disorder [episodic paroxysmal anxiety] without agoraphobia: Secondary | ICD-10-CM

## 2016-06-27 MED ORDER — LORAZEPAM 1 MG PO TABS: ORAL_TABLET | ORAL | 3 refills | Status: DC

## 2016-06-27 MED ORDER — HYDROCODONE-ACETAMINOPHEN 10-325 MG PO TABS: 1.0000 | ORAL_TABLET | ORAL | 0 refills | Status: DC | PRN
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
## 2016-07-05 ENCOUNTER — Telehealth: Payer: Self-pay | Admitting: Family Medicine

## 2016-07-05 NOTE — Telephone Encounter (Signed)
[name] would like a call back to confirm the Rx for HYDROcodone-acetaminophen [facility](NORCO) 10-325 MG tablet written on 06/27/16. Dylan [name] left a message on our voicemail and It was difficult to understand her message. Please advise. Thanks TNP

## 2016-07-05 NOTE — Telephone Encounter (Signed)
Tried calling number below and it is a non working number.

## 2016-07-22 ENCOUNTER — Other Ambulatory Visit: Payer: Self-pay | Admitting: Family Medicine

## 2016-07-22 DIAGNOSIS — G8929 Other chronic pain: Secondary | ICD-10-CM

## 2016-07-22 DIAGNOSIS — M545 Low back pain: Principal | ICD-10-CM

## 2016-07-22 MED ORDER — HYDROCODONE-ACETAMINOPHEN 10-325 MG PO TABS: 1.0000 | ORAL_TABLET | ORAL | 0 refills | Status: DC | PRN

## 2016-08-02 ENCOUNTER — Other Ambulatory Visit: Payer: Self-pay | Admitting: Family Medicine

## 2016-08-02 DIAGNOSIS — F410 Panic disorder [episodic paroxysmal anxiety] without agoraphobia: Secondary | ICD-10-CM

## 2016-08-02 NOTE — Telephone Encounter (Signed)
Pt is requesting a new Rx for LORazepam (ATIVAN) 1 MG tablet [facility] [place] [phone] b/c he is with family in [place]. Pt stated that his Rx was up to 4 times a day and the last one that was sent in was only 3 tablets per day. Pt is requesting to change it back to 4 times per day. Please advise. Thanks TNP

## 2016-08-05 MED ORDER — LORAZEPAM 1 MG PO TABS: ORAL_TABLET | ORAL | 3 refills | Status: DC

## 2016-08-05 NOTE — Telephone Encounter (Signed)
Please review-aa 

## 2016-08-05 NOTE — Telephone Encounter (Signed)
Pt advised and RX called in-aa 

## 2016-08-05 NOTE — Telephone Encounter (Signed)
Please call in [facility]prazolam to the [facility] in [place].

## 2016-08-05 NOTE — Telephone Encounter (Signed)
Pt has called back wanting his Rx.Marland Kitchen. He needs a refill today.  His call back [phone]  Thank sTeript would like a nurse to call him back. asap

## 2016-08-10 ENCOUNTER — Other Ambulatory Visit: Payer: Self-pay | Admitting: Family Medicine

## 2016-08-10 DIAGNOSIS — G8929 Other chronic pain: Secondary | ICD-10-CM

## 2016-08-10 DIAGNOSIS — M545 Low back pain: Principal | ICD-10-CM

## 2016-08-11 MED ORDER — HYDROCODONE-ACETAMINOPHEN 10-325 MG PO TABS: 1.0000 | ORAL_TABLET | ORAL | 0 refills | Status: DC | PRN

## 2016-08-24 ENCOUNTER — Ambulatory Visit: Payer: BLUE CROSS/BLUE SHIELD | Admitting: Family Medicine

## 2016-08-29 ENCOUNTER — Other Ambulatory Visit: Payer: Self-pay | Admitting: Family Medicine

## 2016-08-29 DIAGNOSIS — G8929 Other chronic pain: Secondary | ICD-10-CM

## 2016-08-29 DIAGNOSIS — M545 Low back pain: Principal | ICD-10-CM

## 2016-08-30 MED ORDER — HYDROCODONE-ACETAMINOPHEN 10-325 MG PO TABS: 1.0000 | ORAL_TABLET | ORAL | 0 refills | Status: DC | PRN

## 2016-09-02 ENCOUNTER — Ambulatory Visit: Payer: Self-pay | Admitting: Family Medicine

## 2016-09-14 ENCOUNTER — Other Ambulatory Visit: Payer: Self-pay | Admitting: Family Medicine

## 2016-09-14 DIAGNOSIS — G8929 Other chronic pain: Secondary | ICD-10-CM

## 2016-09-14 DIAGNOSIS — M545 Low back pain: Principal | ICD-10-CM

## 2016-09-15 MED ORDER — HYDROCODONE-ACETAMINOPHEN 10-325 MG PO TABS: 1.0000 | ORAL_TABLET | ORAL | 0 refills | Status: DC | PRN

## 2016-09-29 ENCOUNTER — Other Ambulatory Visit: Payer: Self-pay | Admitting: Family Medicine

## 2016-09-29 DIAGNOSIS — G8929 Other chronic pain: Secondary | ICD-10-CM

## 2016-09-29 DIAGNOSIS — M545 Low back pain: Principal | ICD-10-CM

## 2016-09-30 ENCOUNTER — Other Ambulatory Visit: Payer: Self-pay | Admitting: Family Medicine

## 2016-09-30 DIAGNOSIS — E782 Mixed hyperlipidemia: Secondary | ICD-10-CM

## 2016-09-30 MED ORDER — HYDROCODONE-ACETAMINOPHEN 10-325 MG PO TABS: 1.0000 | ORAL_TABLET | ORAL | 0 refills | Status: DC | PRN

## 2016-10-10 ENCOUNTER — Other Ambulatory Visit: Payer: Self-pay | Admitting: Family Medicine

## 2016-10-12 ENCOUNTER — Other Ambulatory Visit: Payer: Self-pay | Admitting: Family Medicine

## 2016-10-12 DIAGNOSIS — M545 Low back pain: Principal | ICD-10-CM

## 2016-10-12 DIAGNOSIS — G8929 Other chronic pain: Secondary | ICD-10-CM

## 2016-10-13 ENCOUNTER — Telehealth: Payer: Self-pay | Admitting: Family Medicine

## 2016-10-13 ENCOUNTER — Other Ambulatory Visit: Payer: Self-pay | Admitting: Physician Assistant

## 2016-10-13 DIAGNOSIS — G8929 Other chronic pain: Secondary | ICD-10-CM

## 2016-10-13 DIAGNOSIS — M545 Low back pain: Principal | ICD-10-CM

## 2016-10-13 MED ORDER — HYDROCODONE-ACETAMINOPHEN 10-325 MG PO TABS: 1.0000 | ORAL_TABLET | ORAL | 0 refills | Status: DC | PRN

## 2016-10-13 NOTE — Telephone Encounter (Signed)
Antony ContrasJenni reprinted and signed rx. Pt notified.

## 2016-10-13 NOTE — Progress Notes (Signed)
Dr. Sherrie MustacheFisher had printed same day but forgot to sign Rx and is going out of town. Will give Rx as directed for patient.

## 2016-10-13 NOTE — Telephone Encounter (Signed)
HYDROcodone-acetaminophen (NORCO) 10-325 MG tablet  Refill  Thanks teri

## 2016-10-24 ENCOUNTER — Ambulatory Visit
Admission: RE | Admit: 2016-10-24 | Discharge: 2016-10-24 | Disposition: A | Discharge status: Home / Self Care | Payer: Medicare Other | Source: Ambulatory Visit | Attending: Family Medicine | Admitting: Family Medicine

## 2016-10-24 ENCOUNTER — Encounter: Payer: Self-pay | Admitting: Family Medicine

## 2016-10-24 ENCOUNTER — Ambulatory Visit (INDEPENDENT_AMBULATORY_CARE_PROVIDER_SITE_OTHER): Payer: Medicare Other | Admitting: Family Medicine

## 2016-10-24 VITALS — BP 136/88 | HR 78 | Temp 97.8°F | Resp 16 | Wt 211.0 lb

## 2016-10-24 DIAGNOSIS — M545 Low back pain: Secondary | ICD-10-CM

## 2016-10-24 DIAGNOSIS — M48061 Spinal stenosis, lumbar region without neurogenic claudication: Secondary | ICD-10-CM | POA: Diagnosis not present

## 2016-10-24 DIAGNOSIS — G8929 Other chronic pain: Secondary | ICD-10-CM | POA: Diagnosis not present

## 2016-10-24 DIAGNOSIS — M47816 Spondylosis without myelopathy or radiculopathy, lumbar region: Secondary | ICD-10-CM | POA: Diagnosis not present

## 2016-10-24 MED ORDER — CYCLOBENZAPRINE HCL 10 MG PO TABS: 10.0000 mg | ORAL_TABLET | Freq: Three times a day (TID) | ORAL | 2 refills | Status: DC | PRN

## 2016-10-24 NOTE — Patient Instructions (Signed)
   Go to the [facility] on [address] for back Xray  

## 2016-10-24 NOTE — Progress Notes (Signed)
Patient: Dylan [name] Male    DOB: 03-22-1956   61 y.o.   MRN: [id number] Visit Date: 10/24/2016  Today's Provider: [name], MD   Chief Complaint  Patient presents with  . Back Pain  . Spasms   Subjective:    Back Pain  This is a chronic problem. The current episode started more than 1 year ago. The problem has been gradually worsening since onset. The symptoms are aggravated by bending. Pertinent negatives include no abdominal pain, chest pain or fever. Treatments tried: Surgical intervention.   Muscle spasms:  Patient comes in today reporting that the methocarbimol is no longer covered by his insurance. He takes this when he gets severe muscle spasms in his back. He typically take 2 pill 3 to 4 times a week. Has does not recall taking any other muscle relaxers in the past. Patient is here today to discuss other treatment options. Is taking 4-6 hydrocodone per day.     Allergies  Allergen Reactions  . Morphine And Related Hives    Dry heaves and hives  . Belsomra  [Suvorexant]     Hallucinations  . Duloxetine     Nervous  . Meloxicam     Stomach cramps  . Morphine Other (See Comments)     Current Outpatient Prescriptions:  .  aspirin 81 MG tablet, Take 81 mg by mouth daily. Reported on 01/01/2016, Disp: , Rfl:  .  diphenhydramine-acetaminophen (TYLENOL PM) 25-500 MG TABS, Take 1 tablet by mouth at bedtime as needed (pain). Reported on 01/01/2016, Disp: , Rfl:  .  escitalopram (LEXAPRO) 10 MG tablet, Take 1 tablet (10 mg total) by mouth daily., Disp: 30 tablet, Rfl: 6 .  HYDROcodone-acetaminophen (NORCO) 10-325 MG tablet, Take 1-2 tablets by mouth every 4 (four) hours as needed., Disp: 150 tablet, Rfl: 0 .  LORazepam (ATIVAN) 1 MG tablet, take 1 tablet by mouth UP TO four times a day if needed for anxiety, Disp: 120 tablet, Rfl: 3 .  methocarbamol (ROBAXIN) 750 MG tablet, TAKE 1-2 TABLETS BY MOUTH EVERY 6 HOURS AS NEEDED FOR MUSCLE SPASMS, Disp: 60 tablet, Rfl: 12 .   ranitidine (ZANTAC) 150 MG tablet, Take 150 mg by mouth daily as needed for heartburn., Disp: , Rfl:  .  simvastatin (ZOCOR) 40 MG tablet, TAKE 1 TABLET BY MOUTH EVERY DAY., Disp: 30 tablet, Rfl: 12  Review of Systems  Constitutional: Negative for appetite change, chills and fever.  Respiratory: Negative for chest tightness, shortness of breath and wheezing.   Cardiovascular: Negative for chest pain and palpitations.  Gastrointestinal: Negative for abdominal pain, nausea and vomiting.  Musculoskeletal: Positive for back pain and myalgias.    Social History  Substance Use Topics  . Smoking status: Never Smoker  . Smokeless tobacco: Never Used  . Alcohol use No   Objective:   BP 136/88 (BP Location: Right Arm, Patient Position: Sitting, Cuff Size: Large)   Pulse 78   Temp 97.8 F (36.6 C) (Oral)   Resp 16   Wt 211 lb (95.7 kg)   SpO2 95% Comment: room air  BMI 30.28 kg/m  Vitals:   10/24/16 1508  BP: 136/88  Pulse: 78  Resp: 16  Temp: 97.8 F (36.6 C)  TempSrc: Oral  SpO2: 95%  Weight: 211 lb (95.7 kg)     Physical Exam   General Appearance:    Alert, cooperative, no distress  Eyes:    PERRL, conjunctiva/corneas clear, EOM's intact  Lungs:     Clear to auscultation bilaterally, respirations unlabored  Heart:    Regular rate and rhythm  Neurologic:   Awake, alert, oriented x 3. No apparent focal neurological           defect.           Assessment & Plan:     1. Midline low back pain, unspecified chronicity, with sciatica presence unspecified Try change to - cyclobenzaprine (FLEXERIL) 10 MG tablet; Take 1 tablet (10 mg total) by mouth 3 (three) times daily as needed for muscle spasms.  Dispense: 90 tablet; Refill: 2 due to insurance and worsening pain. consider trial of tizanidine if cyclobenzaprine not effective.  Looked up [facility] for methocarbamol which is about $40 for 90 tablets. Since patient typically only takes. Less than 10 tablets a week, it may be  worthwhile to pay cash for medication if alternatives not effective.    - DG Lumbar Spine Complete; Future         [name], MD  [facility]

## 2016-10-25 NOTE — Progress Notes (Signed)
Advised  ED 

## 2016-10-28 ENCOUNTER — Other Ambulatory Visit: Payer: Self-pay | Admitting: Physician Assistant

## 2016-10-28 DIAGNOSIS — M545 Low back pain: Principal | ICD-10-CM

## 2016-10-28 DIAGNOSIS — G8929 Other chronic pain: Secondary | ICD-10-CM

## 2016-10-28 MED ORDER — HYDROCODONE-ACETAMINOPHEN 10-325 MG PO TABS: 1.0000 | ORAL_TABLET | ORAL | 0 refills | Status: DC | PRN

## 2016-11-09 ENCOUNTER — Other Ambulatory Visit: Payer: Self-pay | Admitting: *Deleted

## 2016-11-09 ENCOUNTER — Other Ambulatory Visit: Payer: Self-pay | Admitting: Family Medicine

## 2016-11-09 DIAGNOSIS — E782 Mixed hyperlipidemia: Secondary | ICD-10-CM

## 2016-11-09 DIAGNOSIS — F419 Anxiety disorder, unspecified: Secondary | ICD-10-CM

## 2016-11-09 DIAGNOSIS — F329 Major depressive disorder, single episode, unspecified: Secondary | ICD-10-CM

## 2016-11-09 DIAGNOSIS — F410 Panic disorder [episodic paroxysmal anxiety] without agoraphobia: Secondary | ICD-10-CM

## 2016-11-09 DIAGNOSIS — S42001A Fracture of unspecified part of right clavicle, initial encounter for closed fracture: Secondary | ICD-10-CM

## 2016-11-09 DIAGNOSIS — F32A Depression, unspecified: Secondary | ICD-10-CM

## 2016-11-09 MED ORDER — SIMVASTATIN 40 MG PO TABS: 40.0000 mg | ORAL_TABLET | Freq: Every day | ORAL | 4 refills | Status: DC

## 2016-11-09 MED ORDER — NAPROXEN 500 MG PO TABS: 500.0000 mg | ORAL_TABLET | Freq: Two times a day (BID) | ORAL | 2 refills | Status: DC

## 2016-11-09 MED ORDER — ESCITALOPRAM OXALATE 10 MG PO TABS: 10.0000 mg | ORAL_TABLET | Freq: Every day | ORAL | 3 refills | Status: DC

## 2016-11-09 NOTE — Telephone Encounter (Signed)
Requesting rx for lorazepam be changed to [facility].

## 2016-11-09 NOTE — Telephone Encounter (Signed)
Please send in lorazepam prescription to mail order.

## 2016-11-11 ENCOUNTER — Other Ambulatory Visit: Payer: Self-pay

## 2016-11-11 DIAGNOSIS — F410 Panic disorder [episodic paroxysmal anxiety] without agoraphobia: Secondary | ICD-10-CM

## 2016-11-11 MED ORDER — LORAZEPAM 1 MG PO TABS: ORAL_TABLET | ORAL | 1 refills | Status: DC

## 2016-11-11 NOTE — Telephone Encounter (Signed)
Done-aa 

## 2016-11-13 ENCOUNTER — Other Ambulatory Visit: Payer: Self-pay | Admitting: Family Medicine

## 2016-11-13 DIAGNOSIS — M545 Low back pain: Principal | ICD-10-CM

## 2016-11-13 DIAGNOSIS — G8929 Other chronic pain: Secondary | ICD-10-CM

## 2016-11-15 MED ORDER — HYDROCODONE-ACETAMINOPHEN 10-325 MG PO TABS: 1.0000 | ORAL_TABLET | Freq: Four times a day (QID) | ORAL | 0 refills | Status: DC | PRN

## 2016-12-05 ENCOUNTER — Ambulatory Visit (INDEPENDENT_AMBULATORY_CARE_PROVIDER_SITE_OTHER): Payer: Medicare Other | Admitting: Family Medicine

## 2016-12-05 ENCOUNTER — Encounter: Payer: Self-pay | Admitting: Family Medicine

## 2016-12-05 VITALS — BP 144/92 | HR 88 | Temp 97.9°F | Resp 16 | Wt 213.0 lb

## 2016-12-05 DIAGNOSIS — M545 Low back pain: Secondary | ICD-10-CM | POA: Diagnosis not present

## 2016-12-05 DIAGNOSIS — G8929 Other chronic pain: Secondary | ICD-10-CM

## 2016-12-05 DIAGNOSIS — S22009D Unspecified fracture of unspecified thoracic vertebra, subsequent encounter for fracture with routine healing: Secondary | ICD-10-CM

## 2016-12-05 MED ORDER — HYDROCODONE-ACETAMINOPHEN 10-325 MG PO TABS: 1.0000 | ORAL_TABLET | Freq: Four times a day (QID) | ORAL | 0 refills | Status: DC | PRN

## 2016-12-05 NOTE — Progress Notes (Signed)
Patient: Dylan CopasBarry Schult Male    DOB: 03/03/1956   61 y.o.   MRN: [id number] Visit Date: 12/05/2016  Today's Provider: Mila Merryonald Daryan Cagley, MD   Chief Complaint  Patient presents with  . Follow-up  . Fall    Pt fell trip up stairs over the weekend.    Subjective:    Fall  The accident occurred 2 days ago. Fall occurred: Pt tripped up stairs. Point of impact: Right ankle. The pain is present in the right lower leg. The pain is at a severity of 4/10.   He has been having more back and hip pain the last couple of months and had been taking up to 10 hydrocodone a day in April. Since then he has cut back  To about six a day.     Allergies  Allergen Reactions  . Morphine And Related Hives    Dry heaves and hives  . Belsomra  [Suvorexant]     Hallucinations  . Duloxetine     Nervous  . Meloxicam     Stomach cramps  . Morphine Other (See Comments)     Current Outpatient Prescriptions:  .  aspirin 81 MG tablet, Take 81 mg by mouth daily. Reported on 01/01/2016, Disp: , Rfl:  .  cyclobenzaprine (FLEXERIL) 10 MG tablet, Take 1 tablet (10 mg total) by mouth 3 (three) times daily as needed for muscle spasms., Disp: 90 tablet, Rfl: 2 .  diphenhydramine-acetaminophen (TYLENOL PM) 25-500 MG TABS, Take 1 tablet by mouth at bedtime as needed (pain). Reported on 01/01/2016, Disp: , Rfl:  .  escitalopram (LEXAPRO) 10 MG tablet, Take 1 tablet (10 mg total) by mouth daily., Disp: 90 tablet, Rfl: 3 .  HYDROcodone-acetaminophen (NORCO) 10-325 MG tablet, Take 1-2 tablets by mouth every 6 (six) hours as needed for severe pain., Disp: 150 tablet, Rfl: 0 .  LORazepam (ATIVAN) 1 MG tablet, take 1 tablet by mouth UP TO four times a day if needed for anxiety, Disp: 360 tablet, Rfl: 1 .  naproxen (NAPROSYN) 500 MG tablet, Take 1 tablet (500 mg total) by mouth 2 (two) times daily with a meal., Disp: 180 tablet, Rfl: 2 .  ranitidine (ZANTAC) 150 MG tablet, Take 150 mg by mouth daily as needed for heartburn.,  Disp: , Rfl:  .  simvastatin (ZOCOR) 40 MG tablet, Take 1 tablet (40 mg total) by mouth daily., Disp: 90 tablet, Rfl: 4  Review of Systems  Constitutional: Negative.   Musculoskeletal: Positive for arthralgias (Right hip pain), back pain, gait problem and joint swelling. Negative for myalgias, neck pain and neck stiffness.    Social History  Substance Use Topics  . Smoking status: Never Smoker  . Smokeless tobacco: Never Used  . Alcohol use No   Objective:   There were no vitals taken for this visit. Vitals:   12/05/16 1614  BP: (!) 144/92  Pulse: 88  Resp: 16  Temp: 97.9 F (36.6 C)  TempSrc: Oral  Weight: 213 lb (96.6 kg)     Physical Exam  Moderate tenderness across lumbar spine and para lumbar muscles. Small healing laceration right anterior leg. No erythema. Scant yellow discharge.     Assessment & Plan:      1. Chronic midline low back pain, with sciatica presence unspecified Refill hydrocodone/apap Is to work on limiting to 6 a day.  Refer physical therapy. Consider chiropractor treatments. Consider referral pain clinic.  - HYDROcodone-acetaminophen (NORCO) 10-325 MG tablet; Take 1-2 tablets by  mouth every 6 (six) hours as needed for severe pain.  Dispense: 150 tablet; Refill: 0  2. Closed fracture of transverse process of thoracic vertebra with routine healing, subsequent encounter        [name], MD  [facility]

## 2016-12-23 ENCOUNTER — Other Ambulatory Visit: Payer: Self-pay | Admitting: Family Medicine

## 2016-12-23 DIAGNOSIS — M545 Low back pain: Principal | ICD-10-CM

## 2016-12-23 DIAGNOSIS — G8929 Other chronic pain: Secondary | ICD-10-CM

## 2016-12-23 MED ORDER — HYDROCODONE-ACETAMINOPHEN 10-325 MG PO TABS: 1.0000 | ORAL_TABLET | Freq: Four times a day (QID) | ORAL | 0 refills | Status: DC | PRN

## 2017-01-11 ENCOUNTER — Other Ambulatory Visit: Payer: Self-pay | Admitting: Family Medicine

## 2017-01-11 DIAGNOSIS — M545 Low back pain: Principal | ICD-10-CM

## 2017-01-11 DIAGNOSIS — G8929 Other chronic pain: Secondary | ICD-10-CM

## 2017-01-13 ENCOUNTER — Telehealth: Payer: Self-pay | Admitting: Family Medicine

## 2017-01-13 MED ORDER — HYDROCODONE-ACETAMINOPHEN 10-325 MG PO TABS: 1.0000 | ORAL_TABLET | Freq: Four times a day (QID) | ORAL | 0 refills | Status: DC | PRN

## 2017-01-13 NOTE — Telephone Encounter (Signed)
Pt is calling to see if he can get this rx for pain meds  HYDROcodone-acetaminophen (NORCO) 10-325 MG tablet  Filled.  He is completely out  Pt's call back is [phone]  [facility]

## 2017-01-13 NOTE — Telephone Encounter (Signed)
Pt called back wanting to know why his rx was denied.  If someone could please call him back.  He has to have this medication.  He is completely out.  His call back is [phone]  Thanks teri

## 2017-01-13 NOTE — Telephone Encounter (Signed)
See other message in RX note-aa

## 2017-01-13 NOTE — Addendum Note (Signed)
Addended by: Margaretann LovelessBURNETTE, Annalissa Murphey M on: 01/13/2017 01:59 PM   Modules accepted: Orders

## 2017-01-13 NOTE — Telephone Encounter (Signed)
Pt advised-aa 

## 2017-01-13 NOTE — Telephone Encounter (Signed)
Spoke with patient and he states that he takes 4 to 6 tablets depending on the day, mainly 6 tablets a day. He states he usually gets his RX every 25 days. He states this is the first time he was denied and what is he suppose to do go through withdrawal? He states he is already getting nervous and anxious. I advised patient that I can check with another provider on what to do? -aa

## 2017-01-13 NOTE — Telephone Encounter (Signed)
Please review-aa 

## 2017-01-13 NOTE — Telephone Encounter (Addendum)
I will give him enough for 6 tabs daily for one week (#42) and he should readdress how Rx is written with Dr. Sherrie MustacheFisher as Rx states it is a 30 day supply not a 25 day supply.

## 2017-01-19 ENCOUNTER — Other Ambulatory Visit: Payer: Self-pay | Admitting: Physician Assistant

## 2017-01-19 DIAGNOSIS — M545 Low back pain: Principal | ICD-10-CM

## 2017-01-19 DIAGNOSIS — G8929 Other chronic pain: Secondary | ICD-10-CM

## 2017-01-19 NOTE — Telephone Encounter (Signed)
Last fill 01/13/17 by Joycelyn ManJennifer Burnette for 1 week supply only. It looked he was trying to fill it early on 01/13/17 and 30 days was not approved, message on that day states to discuss with Dr Toya SmothersFisher-aa

## 2017-01-20 MED ORDER — HYDROCODONE-ACETAMINOPHEN 10-325 MG PO TABS: 1.0000 | ORAL_TABLET | Freq: Four times a day (QID) | ORAL | 0 refills | Status: DC | PRN

## 2017-01-20 NOTE — Telephone Encounter (Signed)
Pt called back to see if Rx for HYDROcodone-acetaminophen (NORCO) 10-325 MG tablet was ready for pick up. Please advise. Thanks TNP

## 2017-02-08 ENCOUNTER — Other Ambulatory Visit: Payer: Self-pay | Admitting: Family Medicine

## 2017-02-08 DIAGNOSIS — G8929 Other chronic pain: Secondary | ICD-10-CM

## 2017-02-08 DIAGNOSIS — M545 Low back pain: Principal | ICD-10-CM

## 2017-02-08 MED ORDER — HYDROCODONE-ACETAMINOPHEN 10-325 MG PO TABS: 1.0000 | ORAL_TABLET | Freq: Four times a day (QID) | ORAL | 0 refills | Status: DC | PRN

## 2017-03-01 ENCOUNTER — Other Ambulatory Visit: Payer: Self-pay | Admitting: Family Medicine

## 2017-03-01 DIAGNOSIS — M545 Low back pain: Principal | ICD-10-CM

## 2017-03-01 DIAGNOSIS — G8929 Other chronic pain: Secondary | ICD-10-CM

## 2017-03-02 MED ORDER — HYDROCODONE-ACETAMINOPHEN 10-325 MG PO TABS: 1.0000 | ORAL_TABLET | Freq: Four times a day (QID) | ORAL | 0 refills | Status: DC | PRN

## 2017-03-22 ENCOUNTER — Other Ambulatory Visit: Payer: Self-pay | Admitting: Family Medicine

## 2017-03-22 DIAGNOSIS — M545 Low back pain: Secondary | ICD-10-CM

## 2017-03-22 DIAGNOSIS — G8929 Other chronic pain: Secondary | ICD-10-CM

## 2017-03-22 MED ORDER — HYDROCODONE-ACETAMINOPHEN 10-325 MG PO TABS: 1.0000 | ORAL_TABLET | Freq: Four times a day (QID) | ORAL | 0 refills | Status: DC | PRN

## 2017-04-12 ENCOUNTER — Other Ambulatory Visit: Payer: Self-pay | Admitting: Family Medicine

## 2017-04-12 DIAGNOSIS — G8929 Other chronic pain: Secondary | ICD-10-CM

## 2017-04-12 DIAGNOSIS — M545 Low back pain: Principal | ICD-10-CM

## 2017-04-12 MED ORDER — HYDROCODONE-ACETAMINOPHEN 10-325 MG PO TABS: 1.0000 | ORAL_TABLET | Freq: Four times a day (QID) | ORAL | 0 refills | Status: DC | PRN

## 2017-04-17 ENCOUNTER — Ambulatory Visit: Payer: Medicare Other | Admitting: Family Medicine

## 2017-04-17 NOTE — Progress Notes (Deleted)
       Patient: Dylan [name] Male    DOB: [birth year]   61 y.o.   MRN: [id number] Visit Date: 04/17/2017  Today's Provider: [name], MD   No chief complaint on file.  Subjective:    HPI     Allergies  Allergen Reactions  . Morphine And Related Hives    Dry heaves and hives  . Belsomra  [Suvorexant]     Hallucinations  . Duloxetine     Nervous  . Meloxicam     Stomach cramps  . Morphine Other (See Comments)     Current Outpatient Prescriptions:  .  aspirin 81 MG tablet, Take 81 mg by mouth daily. Reported on 01/01/2016, Disp: , Rfl:  .  cyclobenzaprine (FLEXERIL) 10 MG tablet, TAKE 1 TABLET BY MOUTH 3  TIMES DAILY AS NEEDED FOR  MUSCLE SPASM(S), Disp: 90 tablet, Rfl: 4 .  diphenhydramine-acetaminophen (TYLENOL PM) 25-500 MG TABS, Take 1 tablet by mouth at bedtime as needed (pain). Reported on 01/01/2016, Disp: , Rfl:  .  escitalopram (LEXAPRO) 10 MG tablet, Take 1 tablet (10 mg total) by mouth daily., Disp: 90 tablet, Rfl: 3 .  HYDROcodone-acetaminophen (NORCO) 10-325 MG tablet, Take 1-2 tablets by mouth every 6 (six) hours as needed for severe pain., Disp: 150 tablet, Rfl: 0 .  LORazepam (ATIVAN) 1 MG tablet, take 1 tablet by mouth UP TO four times a day if needed for anxiety, Disp: 360 tablet, Rfl: 1 .  ranitidine (ZANTAC) 150 MG tablet, Take 150 mg by mouth daily as needed for heartburn., Disp: , Rfl:  .  simvastatin (ZOCOR) 40 MG tablet, Take 1 tablet (40 mg total) by mouth daily., Disp: 90 tablet, Rfl: 4  Review of Systems  Constitutional: Negative for appetite change, chills and fever.  Respiratory: Negative for chest tightness, shortness of breath and wheezing.   Cardiovascular: Negative for chest pain and palpitations.  Gastrointestinal: Negative for abdominal pain, nausea and vomiting.    Social History  Substance Use Topics  . Smoking status: Never Smoker  . Smokeless tobacco: Never Used  . Alcohol use No   Objective:   There were no vitals taken for  this visit. There were no vitals filed for this visit.   Physical Exam      Assessment & Plan:           [name], MD  [facility]

## 2017-04-25 ENCOUNTER — Encounter: Payer: Self-pay | Admitting: Family Medicine

## 2017-04-25 ENCOUNTER — Ambulatory Visit (INDEPENDENT_AMBULATORY_CARE_PROVIDER_SITE_OTHER): Payer: Medicare Other | Admitting: Family Medicine

## 2017-04-25 VITALS — BP 140/70 | HR 97 | Temp 98.2°F | Resp 16 | Ht 70.0 in | Wt 213.0 lb

## 2017-04-25 DIAGNOSIS — G4700 Insomnia, unspecified: Secondary | ICD-10-CM | POA: Diagnosis not present

## 2017-04-25 DIAGNOSIS — Z23 Encounter for immunization: Secondary | ICD-10-CM

## 2017-04-25 DIAGNOSIS — G8929 Other chronic pain: Secondary | ICD-10-CM

## 2017-04-25 DIAGNOSIS — Z0000 Encounter for general adult medical examination without abnormal findings: Secondary | ICD-10-CM | POA: Diagnosis not present

## 2017-04-25 DIAGNOSIS — Z86010 Personal history of colonic polyps: Secondary | ICD-10-CM | POA: Insufficient documentation

## 2017-04-25 DIAGNOSIS — Z1211 Encounter for screening for malignant neoplasm of colon: Secondary | ICD-10-CM | POA: Diagnosis not present

## 2017-04-25 DIAGNOSIS — M5030 Other cervical disc degeneration, unspecified cervical region: Secondary | ICD-10-CM

## 2017-04-25 DIAGNOSIS — Z125 Encounter for screening for malignant neoplasm of prostate: Secondary | ICD-10-CM | POA: Diagnosis not present

## 2017-04-25 DIAGNOSIS — M545 Low back pain: Secondary | ICD-10-CM

## 2017-04-25 DIAGNOSIS — I10 Essential (primary) hypertension: Secondary | ICD-10-CM | POA: Diagnosis not present

## 2017-04-25 DIAGNOSIS — E782 Mixed hyperlipidemia: Secondary | ICD-10-CM

## 2017-04-25 MED ORDER — HYDROCODONE-ACETAMINOPHEN 10-325 MG PO TABS: 1.0000 | ORAL_TABLET | Freq: Four times a day (QID) | ORAL | 0 refills | Status: DC | PRN
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
## 2017-04-26 LAB — COMPLETE METABOLIC PANEL WITH GFR
AG RATIO: 1.9 (calc) (ref 1.0–2.5)
ALT: 32 U/L (ref 9–46)
AST: 37 U/L — AB (ref 10–35)
Albumin: 4.7 g/dL (ref 3.6–5.1)
Alkaline phosphatase (APISO): 116 U/L — ABNORMAL HIGH (ref 40–115)
BUN: 14 mg/dL (ref 7–25)
CALCIUM: 9.7 mg/dL (ref 8.6–10.3)
CO2: 26 mmol/L (ref 20–32)
CREATININE: 0.87 mg/dL (ref 0.70–1.25)
Chloride: 104 mmol/L (ref 98–110)
GFR, EST AFRICAN AMERICAN: 108 mL/min/{1.73_m2} (ref >=60)
GFR, EST NON AFRICAN AMERICAN: 93 mL/min/{1.73_m2} (ref >=60)
GLOBULIN: 2.5 g/dL (ref 1.9–3.7)
Glucose, Bld: 106 mg/dL — ABNORMAL HIGH (ref 65–99)
Potassium: 4.8 mmol/L (ref 3.5–5.3)
SODIUM: 139 mmol/L (ref 135–146)
TOTAL PROTEIN: 7.2 g/dL (ref 6.1–8.1)
Total Bilirubin: 0.6 mg/dL (ref 0.2–1.2)

## 2017-04-26 LAB — LIPID PANEL
CHOL/HDL RATIO: 3.8 (calc) (ref <5.0)
Cholesterol: 187 mg/dL (ref <200)
HDL: 49 mg/dL (ref >40)
LDL Cholesterol (Calc): 106 mg/dL (calc) — ABNORMAL HIGH
NON-HDL CHOLESTEROL (CALC): 138 mg/dL — AB (ref <130)
TRIGLYCERIDES: 202 mg/dL — AB (ref <150)

## 2017-04-26 LAB — PSA: PSA: 0.8 ng/mL (ref <=4.0)

## 2017-04-26 LAB — HIV ANTIBODY (ROUTINE TESTING W REFLEX): HIV 1&2 Ab, 4th Generation: NONREACTIVE

## 2017-04-28 ENCOUNTER — Telehealth: Payer: Self-pay | Admitting: Family Medicine

## 2017-04-28 DIAGNOSIS — F410 Panic disorder [episodic paroxysmal anxiety] without agoraphobia: Secondary | ICD-10-CM

## 2017-04-28 NOTE — Telephone Encounter (Signed)
[facility] faxed a request on the following medication. Thanks CC  LORazepam (ATIVAN) 1 MG tablet

## 2017-04-30 MED ORDER — LORAZEPAM 1 MG PO TABS: ORAL_TABLET | ORAL | 1 refills | Status: DC

## 2017-04-30 NOTE — Telephone Encounter (Signed)
Please call in lorazepam.  

## 2017-05-02 NOTE — Telephone Encounter (Signed)
rx called in-Anastasiya V Hopkins, RMA  

## 2017-05-15 ENCOUNTER — Other Ambulatory Visit: Payer: Self-pay | Admitting: Family Medicine

## 2017-05-15 DIAGNOSIS — M545 Low back pain: Principal | ICD-10-CM

## 2017-05-15 DIAGNOSIS — G8929 Other chronic pain: Secondary | ICD-10-CM

## 2017-05-16 MED ORDER — HYDROCODONE-ACETAMINOPHEN 10-325 MG PO TABS: 1.0000 | ORAL_TABLET | Freq: Four times a day (QID) | ORAL | 0 refills | Status: DC | PRN

## 2017-05-31 ENCOUNTER — Other Ambulatory Visit: Payer: Self-pay | Admitting: Family Medicine

## 2017-05-31 DIAGNOSIS — G8929 Other chronic pain: Secondary | ICD-10-CM

## 2017-05-31 DIAGNOSIS — M545 Low back pain: Principal | ICD-10-CM

## 2017-06-01 MED ORDER — HYDROCODONE-ACETAMINOPHEN 10-325 MG PO TABS: 1.0000 | ORAL_TABLET | Freq: Four times a day (QID) | ORAL | 0 refills | Status: DC | PRN

## 2017-06-19 ENCOUNTER — Other Ambulatory Visit: Payer: Self-pay | Admitting: Family Medicine

## 2017-06-19 DIAGNOSIS — G8929 Other chronic pain: Secondary | ICD-10-CM

## 2017-06-19 DIAGNOSIS — M545 Low back pain: Principal | ICD-10-CM

## 2017-06-20 ENCOUNTER — Telehealth: Payer: Self-pay | Admitting: Family Medicine

## 2017-06-20 DIAGNOSIS — Z1211 Encounter for screening for malignant neoplasm of colon: Secondary | ICD-10-CM

## 2017-06-20 NOTE — Telephone Encounter (Signed)
Please advise 

## 2017-06-20 NOTE — Telephone Encounter (Signed)
Pt is requesting a referral to have a colonoscopy.  CB#[phone]/MW

## 2017-06-21 MED ORDER — HYDROCODONE-ACETAMINOPHEN 10-325 MG PO TABS: 1.0000 | ORAL_TABLET | Freq: Four times a day (QID) | ORAL | 0 refills | Status: DC | PRN

## 2017-06-21 NOTE — Telephone Encounter (Signed)
Please advise patient we can send prescription electronically, but need to verify which pharmacy he wants to use. It looks like he goes [facility] and forth [facility].

## 2017-06-21 NOTE — Telephone Encounter (Signed)
Pt called to see if Rx was ready. Pt was advised we can send medication electronically. [facility]. Please advise. Thanks TNP

## 2017-06-29 ENCOUNTER — Ambulatory Visit: Payer: Medicare Other | Admitting: Gastroenterology

## 2017-06-29 ENCOUNTER — Encounter: Payer: Self-pay | Admitting: Gastroenterology

## 2017-06-29 VITALS — BP 162/91 | HR 121 | Ht 70.0 in | Wt 209.8 lb

## 2017-06-29 DIAGNOSIS — Z1211 Encounter for screening for malignant neoplasm of colon: Secondary | ICD-10-CM | POA: Diagnosis not present

## 2017-06-29 DIAGNOSIS — K219 Gastro-esophageal reflux disease without esophagitis: Secondary | ICD-10-CM | POA: Diagnosis not present

## 2017-06-29 MED ORDER — PEG 3350-KCL-NA BICARB-NACL 420 G PO SOLR: 4000.0000 mL | Freq: Once | ORAL | 0 refills | Status: AC

## 2017-06-29 MED ORDER — OMEPRAZOLE 40 MG PO CPDR: 40.0000 mg | DELAYED_RELEASE_CAPSULE | Freq: Every day | ORAL | 3 refills | Status: DC

## 2017-06-29 NOTE — Progress Notes (Signed)
Dylan [name] Revella Shelton MD, MRCP(U.K) [address]  [address]  [place], [place] [address]  Main: [phone]  Fax: [phone]   Gastroenterology Consultation  Referring Provider:     Malva [name], Dylan E, MD Primary Care Physician:  Dylan [name], Dylan E, MD Primary Gastroenterologist:  Dr. Wyline [name] Jilberto [name]  Reason for Consultation:     GERD and colon cancer screening         HPI:   Dylan CopasBarry [name] is a 61 y.o. y/o male referred for consultation & management  by Dr. Sherrie MustacheFisher, Dylan Isaacsonald E, MD.    He has been referred for GERD and colorectal cancer screening .   Reflux:  Onset : few months , getting worse Symptoms: Describes his symptoms of "throat closing up when eating",heatburn. No issues with swallowing. Sometimes when he burps acid comes up.  Recent weight gain: yes  Medications: no  Narcotics or anticholinergics use : hydrocodone for back pain - since 2009 , no change of dose, does feel that food sits in his stomach for a few hours PPI /H2 blockers or Antacid  use and timing :Mylanta- started with zantac - stopped working, changed to peptobismol. Never tried Prilosec.  Dinner time : 5-6 pm , goes to bed around 10 pm , in between , laying flat   Prior EGD: never Family history of esophageal cancer:never, never smoked.    Last colonoscopy was many years back likely 15 years back , was normal, no family history of colon cancer or polyps. Denies any rectal bleeding. Occasionally has hard stool.    Past Medical History:  Diagnosis Date  . History of chicken pox   . History of measles as a child   . History of mumps as a child   . Hypercholesteremia   . Hypertension   . Seizures (HCC)    blacked out in shower when coming off benzo - 'withdrawals'  . Shortness of breath dyspnea    with exertion ? d/t stress & anxiety    Past Surgical History:  Procedure Laterality Date  . BACK SURGERY    . JOINT REPLACEMENT    . KNEE SURGERY Right 1993  . LUMBAR DISC SURGERY  03/18/2015   R L4-5 micro  discectomy, Dr. Lovell SheehanJenkins  . LUMBAR LAMINECTOMY/DECOMPRESSION MICRODISCECTOMY Right 03/18/2015   Procedure: Right Lumbar Four-Five Microdiscectomy;  Surgeon: Tressie StalkerJeffrey Jenkins, MD;  Location: [facility];  Service: Neurosurgery;  Laterality: Right;  Right L45 microdiskectomy  . LUNG SURGERY Left 2009   Lung Collapse: left chest tube  . ROTATOR CUFF REPAIR  2004   had rotator cuff injury and surgery was performed  . TONSILLECTOMY    . TONSILLECTOMY AND ADENOIDECTOMY  1960    Prior to Admission medications   Medication Sig Start Date End Date Taking? Authorizing Provider  aspirin 81 MG tablet Take 81 mg by mouth daily. Reported on 01/01/2016 01/24/08   [provider]  cyclobenzaprine (FLEXERIL) 10 MG tablet TAKE 1 TABLET BY MOUTH 3  TIMES DAILY AS NEEDED FOR  MUSCLE SPASM(S) 03/23/17   Dylan [name], Dylan E, MD  diphenhydramine-acetaminophen (TYLENOL PM) 25-500 MG TABS Take 1 tablet by mouth at bedtime as needed (pain). Reported on 01/01/2016    [provider]  escitalopram (LEXAPRO) 10 MG tablet Take 1 tablet (10 mg total) by mouth daily. 11/09/16   Dylan [name], Dylan E, MD  HYDROcodone-acetaminophen (NORCO) 10-325 MG tablet Take 1-2 tablets by mouth every 6 (six) hours as needed for severe pain. 06/21/17   Dylan [name], Dylan E, MD  LORazepam (ATIVAN) 1 MG tablet take 1 tablet by mouth UP TO four times a day if needed for anxiety 04/30/17   Dylan [name], Dylan E, MD  ranitidine (ZANTAC) 150 MG tablet Take 150 mg by mouth daily as needed for heartburn.    [provider]  simvastatin (ZOCOR) 40 MG tablet Take 1 tablet (40 mg total) by mouth daily. 11/09/16   Dylan [name], Dylan E, MD    Family History  Problem Relation Age of Onset  . Alzheimer's disease Mother   . Stroke Mother   . Heart disease Father   . Congestive Heart Failure Brother   . CAD Brother   . Congestive Heart Failure Sister      Social History   Tobacco Use  . Smoking status: Never Smoker  . Smokeless tobacco: Never Used    Substance Use Topics  . Alcohol use: No  . Drug use: No    Allergies as of 06/29/2017 - Review Complete 04/25/2017  Allergen Reaction Noted  . Morphine and related Hives 04/20/2016  . Belsomra  [suvorexant]  12/29/2014  . Duloxetine  12/29/2014  . Meloxicam  12/29/2014  . Morphine Other (See Comments) 12/29/2014    Review of Systems:    All systems reviewed and negative except where noted in HPI.   Physical Exam:  There were no vitals taken for this visit. No LMP for male patient. Psych:  Alert and cooperative. Normal mood and affect. General:   Alert,  Well-developed, well-nourished, pleasant and cooperative in NAD Head:  Normocephalic and atraumatic. Eyes:  Sclera clear, no icterus.   Conjunctiva pink. Ears:  Normal auditory acuity. Nose:  No deformity, discharge, or lesions. Mouth:  No deformity or lesions,oropharynx pink & moist. Neck:  Supple; no masses or thyromegaly. Lungs:  Respirations even and unlabored.  Clear throughout to auscultation.   No wheezes, crackles, or rhonchi. No acute distress. Heart:  Regular rate and rhythm; no murmurs, clicks, rubs, or gallops. Abdomen:  Normal bowel sounds.  No bruits.  Soft, non-tender and non-distended without masses, hepatosplenomegaly or hernias noted.  No guarding or rebound tenderness.    Neurologic:  Alert and oriented x3;  grossly normal neurologically. Skin:  Intact without significant lesions or rashes. No jaundice. Lymph Nodes:  No significant cervical adenopathy. Psych:  Alert and cooperative. Normal mood and affect.  Imaging Studies: No results found.  Assessment and Plan:   Dylan [name] is a 61 y.o. y/o male has been referred for GERD and colorectal cancer screening   Plan   1. GERD : Counseled on life style changes, suggest to use PPI first thing in the morning on empty stomach and eat 30 minutes after. Advised on the use of a wedge pillow at night , avoid meals for 2 hours prior to bed time. Weight loss.  Patient information provided .Commence on omerpazole 40 mg a day .   Discussed the risks and benefits of long term PPI use including but not limited to bone loss, chronic kidney disease, infections , low magnesium . Aim to use at the lowest dose for the shortest period of time   2. Colon cancer screening : colonoscopy   Follow up in 6 months   Dr Dylan [name] Tevion Laforge MD,MRCP(U.K)

## 2017-06-29 NOTE — Addendum Note (Signed)
Addended by: Ardyth ManARTER, Sadiya Durand Z on: 06/29/2017 02:30 PM   Modules accepted: Orders

## 2017-06-29 NOTE — Addendum Note (Signed)
Addended by: Ardyth ManARTER, Chan Sheahan Z on: 06/29/2017 03:20 PM   Modules accepted: Orders, SmartSet

## 2017-07-05 ENCOUNTER — Ambulatory Visit: Payer: Medicare Other | Admitting: Anesthesiology

## 2017-07-05 ENCOUNTER — Encounter
Admission: RE | Disposition: A | Discharge status: Home / Self Care | Payer: Self-pay | Source: Ambulatory Visit | Attending: Gastroenterology

## 2017-07-05 ENCOUNTER — Ambulatory Visit
Admission: RE | Admit: 2017-07-05 | Discharge: 2017-07-05 | Disposition: A | Discharge status: Home / Self Care | Payer: Medicare Other | Source: Ambulatory Visit | Attending: Gastroenterology | Admitting: Gastroenterology

## 2017-07-05 ENCOUNTER — Other Ambulatory Visit: Payer: Self-pay | Admitting: Family Medicine

## 2017-07-05 DIAGNOSIS — Z1211 Encounter for screening for malignant neoplasm of colon: Secondary | ICD-10-CM | POA: Diagnosis not present

## 2017-07-05 DIAGNOSIS — Z1212 Encounter for screening for malignant neoplasm of rectum: Secondary | ICD-10-CM

## 2017-07-05 DIAGNOSIS — Z7982 Long term (current) use of aspirin: Secondary | ICD-10-CM | POA: Diagnosis not present

## 2017-07-05 DIAGNOSIS — G8929 Other chronic pain: Secondary | ICD-10-CM

## 2017-07-05 DIAGNOSIS — E7800 Pure hypercholesterolemia, unspecified: Secondary | ICD-10-CM | POA: Insufficient documentation

## 2017-07-05 DIAGNOSIS — F418 Other specified anxiety disorders: Secondary | ICD-10-CM | POA: Diagnosis not present

## 2017-07-05 DIAGNOSIS — K5730 Diverticulosis of large intestine without perforation or abscess without bleeding: Secondary | ICD-10-CM | POA: Diagnosis not present

## 2017-07-05 DIAGNOSIS — K5790 Diverticulosis of intestine, part unspecified, without perforation or abscess without bleeding: Secondary | ICD-10-CM | POA: Diagnosis not present

## 2017-07-05 DIAGNOSIS — Z79899 Other long term (current) drug therapy: Secondary | ICD-10-CM | POA: Insufficient documentation

## 2017-07-05 DIAGNOSIS — Z791 Long term (current) use of non-steroidal anti-inflammatories (NSAID): Secondary | ICD-10-CM | POA: Insufficient documentation

## 2017-07-05 DIAGNOSIS — Z888 Allergy status to other drugs, medicaments and biological substances status: Secondary | ICD-10-CM | POA: Insufficient documentation

## 2017-07-05 DIAGNOSIS — M545 Low back pain: Principal | ICD-10-CM

## 2017-07-05 DIAGNOSIS — E669 Obesity, unspecified: Secondary | ICD-10-CM | POA: Insufficient documentation

## 2017-07-05 DIAGNOSIS — Z79891 Long term (current) use of opiate analgesic: Secondary | ICD-10-CM | POA: Diagnosis not present

## 2017-07-05 DIAGNOSIS — Z885 Allergy status to narcotic agent status: Secondary | ICD-10-CM | POA: Diagnosis not present

## 2017-07-05 DIAGNOSIS — I10 Essential (primary) hypertension: Secondary | ICD-10-CM | POA: Insufficient documentation

## 2017-07-05 HISTORY — PX: COLONOSCOPY WITH PROPOFOL: SHX5780

## 2017-07-05 LAB — HM COLONOSCOPY

## 2017-07-05 SURGERY — COLONOSCOPY WITH PROPOFOL
Anesthesia: General

## 2017-07-05 MED ORDER — PROPOFOL 10 MG/ML IV BOLUS
INTRAVENOUS | Status: DC | PRN
  Administered 2017-07-05: 160 mg via INTRAVENOUS
  Administered 2017-07-05: 150 mg via INTRAVENOUS

## 2017-07-05 MED ORDER — LIDOCAINE 2% (20 MG/ML) 5 ML SYRINGE
INTRAMUSCULAR | Status: DC | PRN
  Administered 2017-07-05: 40 mg via INTRAVENOUS

## 2017-07-05 MED ORDER — SODIUM CHLORIDE 0.9 % IV SOLN
INTRAVENOUS | Status: DC | PRN
  Administered 2017-07-05: 14:00:00 via INTRAVENOUS

## 2017-07-05 MED ORDER — FENTANYL CITRATE (PF) 100 MCG/2ML IJ SOLN
INTRAMUSCULAR | Status: DC | PRN
  Administered 2017-07-05 (×2): 50 ug via INTRAVENOUS

## 2017-07-05 MED ORDER — PROPOFOL 500 MG/50ML IV EMUL
INTRAVENOUS | Status: AC
  Filled 2017-07-05: qty 50

## 2017-07-05 MED ORDER — FENTANYL CITRATE (PF) 100 MCG/2ML IJ SOLN
INTRAMUSCULAR | Status: AC
  Filled 2017-07-05: qty 2

## 2017-07-05 MED ORDER — PROPOFOL 500 MG/50ML IV EMUL
INTRAVENOUS | Status: DC | PRN
  Administered 2017-07-05: 180 ug/kg/min via INTRAVENOUS

## 2017-07-05 NOTE — Anesthesia Post-op Follow-up Note (Signed)
Anesthesia QCDR form completed.        

## 2017-07-05 NOTE — Transfer of Care (Signed)
Immediate Anesthesia Transfer of Care Note  Patient: Irving CopasBarry Carvalho  Procedure(s) Performed: COLONOSCOPY WITH PROPOFOL (N/A )  Patient Location: PACU and Endoscopy Unit  Anesthesia Type:General  Level of Consciousness: awake and drowsy  Airway & Oxygen Therapy: Patient Spontanous Breathing and Patient connected to nasal cannula oxygen  Post-op Assessment: Report given to RN and Post -op Vital signs reviewed and stable  Post vital signs: Reviewed and stable  Last Vitals:  Vitals:   07/05/17 1330  BP: (!) 158/81  Pulse: 99  Resp: 14  Temp: 37.7 C  SpO2: 97%    Last Pain:  Vitals:   07/05/17 1330  PainSc: 0-No pain         Complications: No apparent anesthesia complications

## 2017-07-05 NOTE — Anesthesia Preprocedure Evaluation (Signed)
Anesthesia Evaluation  Patient identified by MRN, date of birth, ID band Patient awake    Reviewed: Allergy & Precautions, NPO status , Patient's Chart, lab work & pertinent test results  Airway Mallampati: II       Dental  (+) Teeth Intact   Pulmonary shortness of breath,    breath sounds clear to auscultation       Cardiovascular Exercise Tolerance: Good hypertension, Pt. on medications  Rhythm:Regular     Neuro/Psych  Headaches, Anxiety Depression    GI/Hepatic negative GI ROS, Neg liver ROS,   Endo/Other  negative endocrine ROS  Renal/GU negative Renal ROS     Musculoskeletal   Abdominal (+) + obese,   Peds negative pediatric ROS (+)  Hematology   Anesthesia Other Findings   Reproductive/Obstetrics                             Anesthesia Physical Anesthesia Plan  ASA: II  Anesthesia Plan: General   Post-op Pain Management:    Induction: Intravenous  PONV Risk Score and Plan:   Airway Management Planned: Natural Airway and Nasal Cannula  Additional Equipment:   Intra-op Plan:   Post-operative Plan:   Informed Consent: I have reviewed the patients History and Physical, chart, labs and discussed the procedure including the risks, benefits and alternatives for the proposed anesthesia with the patient or authorized representative who has indicated his/her understanding and acceptance.     Plan Discussed with: CRNA  Anesthesia Plan Comments:         Anesthesia Quick Evaluation

## 2017-07-05 NOTE — H&P (Signed)
Dylan MoodKiran Wyonia Fontanella, MD [address], [address], [place], [place], [address] [address], [address], [place], [place], [address] Phone: [phone]  Fax: [phone]  Primary Care Physician:  Malva LimesFisher, Donald E, MD   Pre-Procedure History & Physical: HPI:  Dylan CopasBarry Bratton is a 61 y.o. male is here for an colonoscopy.   Past Medical History:  Diagnosis Date  . History of chicken pox   . History of measles as a child   . History of mumps as a child   . Hypercholesteremia   . Hypertension   . Seizures (HCC)    blacked out in shower when coming off benzo - 'withdrawals'  . Shortness of breath dyspnea    with exertion ? d/t stress & anxiety    Past Surgical History:  Procedure Laterality Date  . BACK SURGERY    . JOINT REPLACEMENT    . KNEE SURGERY Right 1993  . LUMBAR DISC SURGERY  03/18/2015   R L4-5 micro discectomy, Dr. Lovell SheehanJenkins  . LUMBAR LAMINECTOMY/DECOMPRESSION MICRODISCECTOMY Right 03/18/2015   Procedure: Right Lumbar Four-Five Microdiscectomy;  Surgeon: Tressie StalkerJeffrey Jenkins, MD;  Location: [facility];  Service: Neurosurgery;  Laterality: Right;  Right L45 microdiskectomy  . LUNG SURGERY Left 2009   Lung Collapse: left chest tube  . ROTATOR CUFF REPAIR  2004   had rotator cuff injury and surgery was performed  . TONSILLECTOMY    . TONSILLECTOMY AND ADENOIDECTOMY  1960    Prior to Admission medications   Medication Sig Start Date End Date Taking? Authorizing Provider  aspirin 81 MG tablet Take 81 mg by mouth daily. Reported on 01/01/2016 01/24/08  Yes [provider]  cyclobenzaprine (FLEXERIL) 10 MG tablet TAKE 1 TABLET BY MOUTH 3  TIMES DAILY AS NEEDED FOR  MUSCLE SPASM(S) 03/23/17  Yes Malva LimesFisher, Donald E, MD  escitalopram (LEXAPRO) 10 MG tablet Take 1 tablet (10 mg total) by mouth daily. 11/09/16  Yes Malva LimesFisher, Donald E, MD  HYDROcodone-acetaminophen [facility](NORCO) 10-325 MG tablet Take 1-2 tablets by mouth every 6 (six) hours as needed for severe pain. 06/21/17  Yes Malva LimesFisher, Donald  E, MD  LORazepam (ATIVAN) 1 MG tablet take 1 tablet by mouth UP TO four times a day if needed for anxiety 04/30/17  Yes Malva LimesFisher, Donald E, MD  Melatonin 3 MG CAPS Take 3 mg by mouth once.   Yes [provider]  simvastatin (ZOCOR) 40 MG tablet Take 1 tablet (40 mg total) by mouth daily. 11/09/16  Yes Malva LimesFisher, Donald E, MD  diphenhydramine-acetaminophen (TYLENOL PM) 25-500 MG TABS Take 1 tablet by mouth at bedtime as needed (pain). Reported on 01/01/2016    [provider]  omeprazole (PRILOSEC) 40 MG capsule Take 1 capsule (40 mg total) by mouth daily. Patient not taking: Reported on 07/05/2017 06/29/17   Dylan MoodAnna, Christophr Calix, MD  simethicone (MYLICON) 80 MG chewable tablet Chew 80 mg by mouth every 6 (six) hours as needed for flatulence.    [provider]    Allergies as of 06/29/2017 - Review Complete 06/29/2017  Allergen Reaction Noted  . Morphine and related Hives 04/20/2016  . Belsomra  [suvorexant]  12/29/2014  . Duloxetine  12/29/2014  . Meloxicam  12/29/2014  . Morphine Other (See Comments) 12/29/2014    Family History  Problem Relation Age of Onset  . Alzheimer's disease Mother   . Stroke Mother   . Heart disease Father   . Congestive Heart Failure Brother   . CAD Brother   .  Congestive Heart Failure Sister     Social History   Socioeconomic History  . Marital status: Divorced    Spouse name: Not on file  . Number of children: Not on file  . Years of education: Not on file  . Highest education level: Not on file  Social Needs  . Financial resource strain: Not on file  . Food insecurity - worry: Not on file  . Food insecurity - inability: Not on file  . Transportation needs - medical: Not on file  . Transportation needs - non-medical: Not on file  Occupational History  . Not on file  Tobacco Use  . Smoking status: Never Smoker  . Smokeless tobacco: Never Used  Substance and Sexual Activity  . Alcohol use: No  . Drug use: No  . Sexual activity:  Yes  Other Topics Concern  . Not on file  Social History Narrative   ** Merged History Encounter **        Review of Systems: See HPI, otherwise negative ROS  Physical Exam: BP (!) 158/81 (BP Location: Left Arm)   Pulse 99   Temp 99.9 F (37.7 C)   Resp 14   SpO2 97%  General:   Alert,  pleasant and cooperative in NAD Head:  Normocephalic and atraumatic. Neck:  Supple; no masses or thyromegaly. Lungs:  Clear throughout to auscultation, normal respiratory effort.    Heart:  +S1, +S2, Regular rate and rhythm, No edema. Abdomen:  Soft, nontender and nondistended. Normal bowel sounds, without guarding, and without rebound.   Neurologic:  Alert and  oriented x4;  grossly normal neurologically.  Impression/Plan: Dylan CopasBarry [name] is here for an colonoscopy to be performed for Screening colonoscopy average risk   Risks, benefits, limitations, and alternatives regarding  colonoscopy have been reviewed with the patient.  Questions have been answered.  All parties agreeable.   Dylan MoodKiran Tiron Suski, MD  07/05/2017, 1:54 PM

## 2017-07-05 NOTE — Op Note (Signed)
[facility] Gastroenterology Patient Name: Dylan CopasBarry [name] Procedure Date: 07/05/2017 2:02 PM MRN: [id number] Account #: [id number] Date of Birth: 10/16/1955 Admit Type: Outpatient Age: 61 [facility] ROOM 1 Gender: Male Note Status: Finalized Procedure:            Colonoscopy Indications:          Screening for colorectal malignant neoplasm Providers:            [name] Chicquita Mendel MD, MD Referring MD:         Demetrios Isaacsonald E. Sherrie MustacheFisher, MD (Referring MD) Medicines:            Monitored Anesthesia Care Complications:        No immediate complications. Procedure:            Pre-Anesthesia Assessment:                       - Prior to the procedure, a History and Physical was                        performed, and patient medications, allergies and                        sensitivities were reviewed. The patient's tolerance of                        previous anesthesia was reviewed.                       - The risks and benefits of the procedure and the                        sedation options and risks were discussed with the                        patient. All questions were answered and informed                        consent was obtained.                       - ASA Grade Assessment: II - A patient with mild                        systemic disease.                       After obtaining informed consent, the colonoscope was                        passed under direct vision. Throughout the procedure,                        the patient's blood pressure, pulse, and oxygen                        saturations were monitored continuously. The                        Colonoscope was introduced through the anus and  advanced to the the cecum, identified by the                        appendiceal orifice, IC valve and transillumination.                        The colonoscopy was performed with ease. The patient                        tolerated the procedure  well. Findings:      The perianal and digital rectal examinations were normal.      The exam was otherwise without abnormality on direct and retroflexion       views.      A few small-mouthed diverticula were found in the sigmoid colon. Impression:           - The examination was otherwise normal on direct and                        retroflexion views.                       - No specimens collected. Recommendation:       - Discharge patient to home (with escort).                       - Resume previous diet.                       - Continue present medications.                       - Repeat colonoscopy in 10 years for screening purposes.                       - Return to my office in 2 months. Procedure Code(s):    --- Professional ---                       [id number], Colonoscopy, flexible; diagnostic, including                        collection of specimen(s) by brushing or washing, when                        performed (separate procedure) CPT copyright 2016 American Medical Association. All rights reserved. The codes documented in this report are preliminary and upon coder review may  be revised to meet current compliance requirements. [name] Taesha Goodell, MD [name] Senai Ramnath MD, MD 07/05/2017 2:20:18 PM This report has been signed electronically. Number of Addenda: 0 Note Initiated On: 07/05/2017 2:02 PM Scope Withdrawal Time: 0 hours 8 minutes 56 seconds  Total Procedure Duration: 0 hours 10 minutes 25 seconds       [facility]

## 2017-07-06 ENCOUNTER — Encounter: Payer: Self-pay | Admitting: Gastroenterology

## 2017-07-06 NOTE — Anesthesia Postprocedure Evaluation (Signed)
Anesthesia Post Note  Patient: Dylan CopasBarry Piscopo  Procedure(s) Performed: COLONOSCOPY WITH PROPOFOL (N/A )  Patient location during evaluation: PACU Anesthesia Type: General Level of consciousness: awake Pain management: pain level controlled Vital Signs Assessment: post-procedure vital signs reviewed and stable Respiratory status: spontaneous breathing Cardiovascular status: stable Anesthetic complications: no     Last Vitals:  Vitals:   07/05/17 1434 07/05/17 1444  BP: 121/74 124/80  Pulse: 86 83  Resp: 16 10  Temp:    SpO2: 97% 96%    Last Pain:  Vitals:   07/05/17 1424  TempSrc: Tympanic  PainSc:                  VAN STAVEREN,Kamrie Fanton

## 2017-07-07 MED ORDER — HYDROCODONE-ACETAMINOPHEN 10-325 MG PO TABS: 1.0000 | ORAL_TABLET | Freq: Four times a day (QID) | ORAL | 0 refills | Status: DC | PRN

## 2017-07-07 NOTE — Telephone Encounter (Signed)
Pt called for an update on Rx for HYDROcodone-acetaminophen (NORCO) 10-325 MG tablet. Pt stated that he will need to be able to pick up medication from pharmacy on Sunday so pt would like the Rx [facility] [place] so he can get it on Sunday 07/09/17. Please advise. Thanks TNP

## 2017-07-25 ENCOUNTER — Other Ambulatory Visit: Payer: Self-pay | Admitting: Family Medicine

## 2017-07-25 DIAGNOSIS — M545 Low back pain: Principal | ICD-10-CM

## 2017-07-25 DIAGNOSIS — G8929 Other chronic pain: Secondary | ICD-10-CM

## 2017-07-25 NOTE — Telephone Encounter (Signed)
Patient requesting refill on his HYDROcodone-acetaminophen (NORCO) 10-325 MG tablet

## 2017-07-26 MED ORDER — HYDROCODONE-ACETAMINOPHEN 10-325 MG PO TABS: 1.0000 | ORAL_TABLET | Freq: Four times a day (QID) | ORAL | 0 refills | Status: DC | PRN

## 2017-07-26 NOTE — Telephone Encounter (Signed)
Will send refill, but he is due for follow up ov. Within the next month.  Please verify that he wants this sent [facility] [address].

## 2017-07-26 NOTE — Telephone Encounter (Signed)
Patient advised. He requested the RX be [facility] [place] [facility] of [facility]. Follow up appointment scheduled for 08/26/17

## 2017-08-02 DIAGNOSIS — M545 Low back pain: Secondary | ICD-10-CM | POA: Diagnosis not present

## 2017-08-02 DIAGNOSIS — M25552 Pain in left hip: Secondary | ICD-10-CM | POA: Diagnosis not present

## 2017-08-13 ENCOUNTER — Other Ambulatory Visit: Payer: Self-pay | Admitting: Family Medicine

## 2017-08-13 DIAGNOSIS — G8929 Other chronic pain: Secondary | ICD-10-CM

## 2017-08-13 DIAGNOSIS — M545 Low back pain: Principal | ICD-10-CM

## 2017-08-14 NOTE — Telephone Encounter (Signed)
Pt is calling back and requesting refill.  Pt states he is almost out of medication.

## 2017-08-15 MED ORDER — HYDROCODONE-ACETAMINOPHEN 10-325 MG PO TABS
1.0000 | ORAL_TABLET | Freq: Four times a day (QID) | ORAL | 0 refills | Status: DC | PRN
Start: 2017-08-15 — End: 2017-09-03

## 2017-08-15 NOTE — Telephone Encounter (Signed)
Please review. Thanks!  

## 2017-08-15 NOTE — Telephone Encounter (Signed)
Pt called back and is requesting a call back to let him know if this isn't going to be sent in today. Pt stated that he has been completely without medication today. Pt was advised refill request can take 24 to 48 hours.  Pt is requesting HYDROcodone-acetaminophen (NORCO) 10-325 MG tablet be [facility] to [facility] [place].  Last Rx: 07/26/17  Please advise. Thanks TNP

## 2017-08-15 NOTE — Telephone Encounter (Signed)
Patient is calling to see why this has not been done.  He states that he is completely out of this medication.  He states that he took his last one last night.  Please call patient and let him know.

## 2017-08-25 ENCOUNTER — Encounter: Payer: Self-pay | Admitting: Family Medicine

## 2017-08-25 ENCOUNTER — Ambulatory Visit (INDEPENDENT_AMBULATORY_CARE_PROVIDER_SITE_OTHER): Payer: Medicare Other | Admitting: Family Medicine

## 2017-08-25 VITALS — BP 134/86 | HR 82 | Temp 97.9°F | Resp 16 | Wt 211.0 lb

## 2017-08-25 DIAGNOSIS — F410 Panic disorder [episodic paroxysmal anxiety] without agoraphobia: Secondary | ICD-10-CM | POA: Diagnosis not present

## 2017-08-25 DIAGNOSIS — F329 Major depressive disorder, single episode, unspecified: Secondary | ICD-10-CM | POA: Diagnosis not present

## 2017-08-25 DIAGNOSIS — H9193 Unspecified hearing loss, bilateral: Secondary | ICD-10-CM | POA: Diagnosis not present

## 2017-08-25 DIAGNOSIS — F1190 Opioid use, unspecified, uncomplicated: Secondary | ICD-10-CM

## 2017-08-25 DIAGNOSIS — F32A Depression, unspecified: Secondary | ICD-10-CM

## 2017-08-25 DIAGNOSIS — F419 Anxiety disorder, unspecified: Secondary | ICD-10-CM

## 2017-08-25 DIAGNOSIS — Z79899 Other long term (current) drug therapy: Secondary | ICD-10-CM | POA: Diagnosis not present

## 2017-08-25 MED ORDER — ESCITALOPRAM OXALATE 20 MG PO TABS: 20.0000 mg | ORAL_TABLET | Freq: Every day | ORAL | 4 refills | Status: DC

## 2017-08-25 NOTE — Progress Notes (Signed)
Patient: Dylan [name] Male    DOB: 1955/10/13   62 y.o.   MRN: [id number] Visit Date: 08/25/2017  Today's Provider: [name], MD   Chief Complaint  Patient presents with  . Follow-up  . Hypertension  . Pain   Subjective:    HPI  Hypertension, follow-up:  BP Readings from Last 3 Encounters:  07/05/17 124/80  06/29/17 (!) 162/91  04/25/17 140/70    He was last seen for hypertension 4 months ago.  BP at that visit was 140/70. Management since that visit includes no changes. He reports good compliance with treatment. He is not having side effects.  He is exercising. He is not adherent to low salt diet.   Outside blood pressures are checked occasionally at the pharmacy. He is experiencing none.  Patient denies chest pain, chest pressure/discomfort, claudication, dyspnea, exertional chest pressure/discomfort, fatigue, irregular heart beat, lower extremity edema, near-syncope, orthopnea, palpitations, paroxysmal nocturnal dyspnea, syncope and tachypnea.   Cardiovascular risk factors include advanced age (older than 78 for men, 39 for women), hypertension and male gender.  Use of agents associated with hypertension: NSAIDS.     Weight trend: stable Wt Readings from Last 3 Encounters:  06/29/17 209 lb 12.8 oz (95.2 kg)  04/25/17 213 lb (96.6 kg)  12/05/16 213 lb (96.6 kg)    Current diet: in general, an "unhealthy" diet  ------------------------------------------------------------------------ Follow up of Insomnia: Patient was last seen for this problem 4 months and no changes were made. Patient reports good compliance with treatment, good tolerance and fair symptom control. Takes lorazepam every night at bedtime.   Follow up of Chronic pain: Patient was last seen for this problem 4 months ago and no changes were made. Patient reports good compliance with treatment, good tolerance and fair symptom control. Has been working on cutting back on hydrocodone, usually  takes 4-5 tablets a day. Reports it remains effective and having no adverse effects.   He also reports that he has been having more difficulty hearing and understanding speech      Allergies  Allergen Reactions  . Morphine And Related Hives    Dry heaves and hives  . Belsomra  [Suvorexant]     Hallucinations  . Duloxetine     Nervous  . Meloxicam     Stomach cramps  . Morphine Other (See Comments)     Current Outpatient Medications:  .  aspirin 81 MG tablet, Take 81 mg by mouth daily. Reported on 01/01/2016, Disp: , Rfl:  .  cyclobenzaprine (FLEXERIL) 10 MG tablet, TAKE 1 TABLET BY MOUTH 3  TIMES DAILY AS NEEDED FOR  MUSCLE SPASM(S), Disp: 90 tablet, Rfl: 4 .  diphenhydramine-acetaminophen (TYLENOL PM) 25-500 MG TABS, Take 1 tablet by mouth at bedtime as needed (pain). Reported on 01/01/2016, Disp: , Rfl:  .  escitalopram (LEXAPRO) 10 MG tablet, Take 1 tablet (10 mg total) by mouth daily., Disp: 90 tablet, Rfl: 3 .  HYDROcodone-acetaminophen (NORCO) 10-325 MG tablet, Take 1-2 tablets by mouth every 6 (six) hours as needed for severe pain., Disp: 150 tablet, Rfl: 0 .  LORazepam (ATIVAN) 1 MG tablet, take 1 tablet by mouth UP TO four times a day if needed for anxiety, Disp: 360 tablet, Rfl: 1 .  Melatonin 3 MG CAPS, Take 3 mg by mouth once., Disp: , Rfl:  .  omeprazole (PRILOSEC) 40 MG capsule, Take 1 capsule (40 mg total) by mouth daily., Disp: 90 capsule, Rfl: 3 .  simethicone (MYLICON)  80 MG chewable tablet, Chew 80 mg by mouth every 6 (six) hours as needed for flatulence., Disp: , Rfl:  .  simvastatin (ZOCOR) 40 MG tablet, Take 1 tablet (40 mg total) by mouth daily., Disp: 90 tablet, Rfl: 4 .  TESTOSTERONE NA, Take 3 tablets by mouth daily. Nugenix, Disp: , Rfl:   Review of Systems  Constitutional: Negative for appetite change, chills and fever.  Respiratory: Negative for chest tightness, shortness of breath and wheezing.   Cardiovascular: Negative for chest pain and palpitations.   Gastrointestinal: Negative for abdominal pain, nausea and vomiting.  Musculoskeletal: Positive for arthralgias, back pain and neck pain.    Social History   Tobacco Use  . Smoking status: Never Smoker  . Smokeless tobacco: Never Used  Substance Use Topics  . Alcohol use: No   Objective:   BP 134/86 (BP Location: Left Arm, Cuff Size: Large)   Pulse 82   Temp 97.9 F (36.6 C) (Oral)   Resp 16   Wt 211 lb (95.7 kg)   SpO2 95% Comment: room air  BMI 30.28 kg/m  Vitals:   08/25/17 1505 08/25/17 1508  BP: (!) 142/90 134/86  Pulse: 82   Resp: 16   Temp: 97.9 F (36.6 C)   TempSrc: Oral   SpO2: 95%   Weight: 211 lb (95.7 kg)      Physical Exam   General Appearance:    Alert, cooperative, no distress  Eyes:    PERRL, conjunctiva/corneas clear, EOM's intact       Lungs:     Clear to auscultation bilaterally, respirations unlabored  Heart:    Regular rate and rhythm  Neurologic:   Awake, alert, oriented x 3. No apparent focal neurological           defect.   ENT:   TMs clear, auditory canals clear.        Assessment & Plan:     1. Panic attacks Have been worse lately, but initially responded will to initiation of escitalopram. Will increase to 20mg   - escitalopram (LEXAPRO) 20 MG tablet; Take 1 tablet (20 mg total) by mouth daily. Every day  Dispense: 90 tablet; Refill: 4  2. Acute anxiety  - escitalopram (LEXAPRO) 20 MG tablet; Take 1 tablet (20 mg total) by mouth daily. Every day  Dispense: 90 tablet; Refill: 4  3. Depression, unspecified depression type  - escitalopram (LEXAPRO) 20 MG tablet; Take 1 tablet (20 mg total) by mouth daily. Every day  Dispense: 90 tablet; Refill: 4  4. Difficulty hearing high frequency sounds, bilateral  - Ambulatory referral to Audiology  5. Chronic, continuous use of opioids Adequate control of pain on current regiment.  - Pain [facility]gt [facility] (14 Drugs), Ur       Mila Merryonald Maxten Shuler, MD  [facility]

## 2017-08-26 LAB — PAIN MGT SCRN (14 DRUGS), UR
AMPHETAMINE SCREEN URINE: NEGATIVE ng/mL
BARBITURATE SCREEN URINE: NEGATIVE ng/mL
BENZODIAZEPINE SCREEN, URINE: NEGATIVE ng/mL
Buprenorphine, Urine: NEGATIVE ng/mL
CANNABINOIDS UR QL SCN: NEGATIVE ng/mL
Cocaine (Metab) Scrn, Ur: NEGATIVE ng/mL
Creatinine(Crt), U: 168 mg/dL (ref 20.0–300.0)
Fentanyl, Urine: NEGATIVE pg/mL
Meperidine Screen, Urine: NEGATIVE ng/mL
Methadone Screen, Urine: NEGATIVE ng/mL
OXYCODONE+OXYMORPHONE UR QL SCN: NEGATIVE ng/mL
Opiate Scrn, Ur: POSITIVE ng/mL — AB
PH UR, DRUG SCRN: 5.9 (ref 4.5–8.9)
PHENCYCLIDINE QUANTITATIVE URINE: NEGATIVE ng/mL
PROPOXYPHENE SCREEN URINE: NEGATIVE ng/mL
Tramadol Screen, Urine: NEGATIVE ng/mL

## 2017-09-03 ENCOUNTER — Other Ambulatory Visit: Payer: Self-pay | Admitting: Family Medicine

## 2017-09-03 DIAGNOSIS — G8929 Other chronic pain: Secondary | ICD-10-CM

## 2017-09-03 DIAGNOSIS — M545 Low back pain: Principal | ICD-10-CM

## 2017-09-04 ENCOUNTER — Ambulatory Visit: Payer: Self-pay | Admitting: Gastroenterology

## 2017-09-04 MED ORDER — HYDROCODONE-ACETAMINOPHEN 10-325 MG PO TABS: 1.0000 | ORAL_TABLET | Freq: Four times a day (QID) | ORAL | 0 refills | Status: DC | PRN

## 2017-09-07 DIAGNOSIS — H903 Sensorineural hearing loss, bilateral: Secondary | ICD-10-CM | POA: Diagnosis not present

## 2017-09-13 ENCOUNTER — Encounter: Payer: Self-pay | Admitting: Family Medicine

## 2017-09-13 DIAGNOSIS — H903 Sensorineural hearing loss, bilateral: Secondary | ICD-10-CM | POA: Insufficient documentation

## 2017-09-13 DIAGNOSIS — H905 Unspecified sensorineural hearing loss: Secondary | ICD-10-CM | POA: Insufficient documentation

## 2017-09-20 ENCOUNTER — Telehealth: Payer: Self-pay | Admitting: Family Medicine

## 2017-09-20 ENCOUNTER — Other Ambulatory Visit: Payer: Self-pay | Admitting: *Deleted

## 2017-09-20 DIAGNOSIS — M545 Low back pain: Principal | ICD-10-CM

## 2017-09-20 DIAGNOSIS — G8929 Other chronic pain: Secondary | ICD-10-CM

## 2017-09-20 MED ORDER — HYDROCODONE-ACETAMINOPHEN 10-325 MG PO TABS: 1.0000 | ORAL_TABLET | Freq: Four times a day (QID) | ORAL | 0 refills | Status: DC | PRN

## 2017-09-20 NOTE — Telephone Encounter (Signed)
error 

## 2017-09-22 MED ORDER — HYDROCODONE-ACETAMINOPHEN 10-325 MG PO TABS: 1.0000 | ORAL_TABLET | Freq: Four times a day (QID) | ORAL | 0 refills | Status: DC | PRN

## 2017-09-22 NOTE — Addendum Note (Signed)
Addended by: Mila MerryFISHER, DONALD E on: 09/22/2017 11:46 AM   Modules accepted: Orders

## 2017-09-22 NOTE — Telephone Encounter (Signed)
Patient called and states that pharmacy does not have this prescription.  It looks like transmission failed.  Could you resend.

## 2017-10-07 ENCOUNTER — Other Ambulatory Visit: Payer: Self-pay | Admitting: Family Medicine

## 2017-10-07 DIAGNOSIS — G8929 Other chronic pain: Secondary | ICD-10-CM

## 2017-10-07 DIAGNOSIS — M545 Low back pain: Principal | ICD-10-CM

## 2017-10-09 MED ORDER — HYDROCODONE-ACETAMINOPHEN 10-325 MG PO TABS: 1.0000 | ORAL_TABLET | Freq: Four times a day (QID) | ORAL | 0 refills | Status: DC | PRN

## 2017-10-12 ENCOUNTER — Other Ambulatory Visit: Payer: Self-pay

## 2017-10-12 DIAGNOSIS — F410 Panic disorder [episodic paroxysmal anxiety] without agoraphobia: Secondary | ICD-10-CM

## 2017-10-12 MED ORDER — LORAZEPAM 1 MG PO TABS: ORAL_TABLET | ORAL | 1 refills | Status: DC

## 2017-10-12 NOTE — Telephone Encounter (Signed)
Patient would like [facility] sent into [facility] [place] day supply.

## 2017-10-25 ENCOUNTER — Other Ambulatory Visit: Payer: Self-pay | Admitting: Family Medicine

## 2017-10-25 DIAGNOSIS — M545 Low back pain: Principal | ICD-10-CM

## 2017-10-25 DIAGNOSIS — G8929 Other chronic pain: Secondary | ICD-10-CM

## 2017-10-25 MED ORDER — HYDROCODONE-ACETAMINOPHEN 10-325 MG PO TABS: 1.0000 | ORAL_TABLET | Freq: Four times a day (QID) | ORAL | 0 refills | Status: DC | PRN

## 2017-11-09 ENCOUNTER — Other Ambulatory Visit: Payer: Self-pay | Admitting: Family Medicine

## 2017-11-09 DIAGNOSIS — G8929 Other chronic pain: Secondary | ICD-10-CM

## 2017-11-09 DIAGNOSIS — M545 Low back pain: Principal | ICD-10-CM

## 2017-11-10 MED ORDER — HYDROCODONE-ACETAMINOPHEN 10-325 MG PO TABS: 1.0000 | ORAL_TABLET | Freq: Four times a day (QID) | ORAL | 0 refills | Status: DC | PRN

## 2017-11-10 NOTE — Telephone Encounter (Signed)
NCSSR reviewed. Rx sent in

## 2017-11-23 ENCOUNTER — Other Ambulatory Visit: Payer: Self-pay | Admitting: Family Medicine

## 2017-11-23 ENCOUNTER — Other Ambulatory Visit: Payer: Self-pay | Admitting: Physician Assistant

## 2017-11-23 DIAGNOSIS — G8929 Other chronic pain: Secondary | ICD-10-CM

## 2017-11-23 DIAGNOSIS — M545 Low back pain: Secondary | ICD-10-CM

## 2017-11-24 MED ORDER — HYDROCODONE-ACETAMINOPHEN 10-325 MG PO TABS: 1.0000 | ORAL_TABLET | Freq: Four times a day (QID) | ORAL | 0 refills | Status: DC | PRN

## 2017-12-08 ENCOUNTER — Other Ambulatory Visit: Payer: Self-pay | Admitting: Family Medicine

## 2017-12-08 DIAGNOSIS — M545 Low back pain: Principal | ICD-10-CM

## 2017-12-08 DIAGNOSIS — G8929 Other chronic pain: Secondary | ICD-10-CM

## 2017-12-08 MED ORDER — HYDROCODONE-ACETAMINOPHEN 10-325 MG PO TABS: 1.0000 | ORAL_TABLET | Freq: Four times a day (QID) | ORAL | 0 refills | Status: DC | PRN

## 2017-12-12 ENCOUNTER — Telehealth: Payer: Self-pay | Admitting: Emergency Medicine

## 2017-12-12 NOTE — Telephone Encounter (Signed)
Please advise 

## 2017-12-12 NOTE — Telephone Encounter (Signed)
Do not take hydrocodone for anxiety, that is an extremely inappropriate and addictive way to take medication.  Prescription was already [facility] to [facility] on 12-08-17. Was only sent in early due to long weekend. Will not be refilled early in June.

## 2017-12-12 NOTE — Telephone Encounter (Signed)
Pt called wanting his Hydrocodone rx filled early. He reports that he is out. He reports that he had a bad month last month and took more than he should have. He lost several friends last month and used his hydrocodone and anxiety medication to cope with it so he ran out early. He reports that he has been out of medication for 2 days. He ask that we call [facility] in [place] to tell them it can be filled early. Please advise.

## 2017-12-12 NOTE — Telephone Encounter (Signed)
Patient was advised.  

## 2017-12-22 ENCOUNTER — Encounter: Payer: Self-pay | Admitting: Family Medicine

## 2017-12-22 ENCOUNTER — Ambulatory Visit (INDEPENDENT_AMBULATORY_CARE_PROVIDER_SITE_OTHER): Payer: Medicare Other | Admitting: Family Medicine

## 2017-12-22 VITALS — BP 140/80 | HR 92 | Temp 97.5°F | Resp 16 | Wt 208.0 lb

## 2017-12-22 DIAGNOSIS — F430 Acute stress reaction: Secondary | ICD-10-CM

## 2017-12-22 DIAGNOSIS — F410 Panic disorder [episodic paroxysmal anxiety] without agoraphobia: Secondary | ICD-10-CM | POA: Diagnosis not present

## 2017-12-22 DIAGNOSIS — H60502 Unspecified acute noninfective otitis externa, left ear: Secondary | ICD-10-CM

## 2017-12-22 MED ORDER — NEOMYCIN-POLYMYXIN-HC 3.5-10000-1 OT SOLN: 3.0000 [drp] | Freq: Four times a day (QID) | OTIC | 0 refills | Status: AC

## 2017-12-22 MED ORDER — AMOXICILLIN 500 MG PO CAPS: 1000.0000 mg | ORAL_CAPSULE | Freq: Two times a day (BID) | ORAL | 0 refills | Status: AC

## 2017-12-22 MED ORDER — LORAZEPAM 1 MG PO TABS: ORAL_TABLET | ORAL | 3 refills | Status: DC

## 2017-12-22 NOTE — Progress Notes (Signed)
Patient: Dylan [name] Male    DOB: 1955/07/30   62 y.o.   MRN: [id number] Visit Date: 12/22/2017  Today's Provider: [name], MD   Chief Complaint  Patient presents with  . Anxiety   Subjective:    HPI Anxiety: Patient comes in today wanting to discuss his medications. He reports he had a stressful month last month, and took more doses of Lorazepam. He says four of his friends died suddenly last month and he has had a hard time coping. He had 90 day prescription dispensed on 3/28, but has no more left.   Ear pain: Patient complains of pain of the left ear for the past week. He also complains of facial swelling on the left side. No URI symptoms. Feels like fluid in his ear.    Allergies  Allergen Reactions  . Morphine And Related Hives    Dry heaves and hives  . Belsomra  [Suvorexant]     Hallucinations  . Duloxetine     Nervous  . Meloxicam     Stomach cramps  . Morphine Other (See Comments)     Current Outpatient Medications:  .  aspirin 81 MG tablet, Take 81 mg by mouth daily. Reported on 01/01/2016, Disp: , Rfl:  .  cyclobenzaprine (FLEXERIL) 10 MG tablet, TAKE 1 TABLET BY MOUTH 3  TIMES DAILY AS NEEDED FOR  MUSCLE SPASM(S), Disp: 90 tablet, Rfl: 4 .  diphenhydramine-acetaminophen (TYLENOL PM) 25-500 MG TABS, Take 1 tablet by mouth at bedtime as needed (pain). Reported on 01/01/2016, Disp: , Rfl:  .  escitalopram (LEXAPRO) 20 MG tablet, Take 1 tablet (20 mg total) by mouth daily. Every day, Disp: 90 tablet, Rfl: 4 .  HYDROcodone-acetaminophen (NORCO) 10-325 MG tablet, Take 1-2 tablets by mouth every 6 (six) hours as needed for severe pain., Disp: 150 tablet, Rfl: 0 .  LORazepam (ATIVAN) 1 MG tablet, take 1 tablet by mouth UP TO four times a day if needed for anxiety, Disp: 360 tablet, Rfl: 1 .  Melatonin 3 MG CAPS, Take 3 mg by mouth once., Disp: , Rfl:  .  omeprazole (PRILOSEC) 40 MG capsule, Take 1 capsule (40 mg total) by mouth daily., Disp: 90 capsule, Rfl:  3 .  simethicone (MYLICON) 80 MG chewable tablet, Chew 80 mg by mouth every 6 (six) hours as needed for flatulence., Disp: , Rfl:  .  simvastatin (ZOCOR) 40 MG tablet, Take 1 tablet (40 mg total) by mouth daily., Disp: 90 tablet, Rfl: 4 .  TESTOSTERONE NA, Take 3 tablets by mouth daily. Nugenix, Disp: , Rfl:   Review of Systems  Constitutional: Positive for diaphoresis. Negative for appetite change, chills and fever.  HENT: Positive for ear pain (left ear), facial swelling (left side of face) and tinnitus.   Eyes: Positive for visual disturbance.  Respiratory: Positive for shortness of breath. Negative for chest tightness and wheezing.   Cardiovascular: Negative for chest pain and palpitations.  Gastrointestinal: Negative for abdominal pain, nausea and vomiting.    Social History   Tobacco Use  . Smoking status: Never Smoker  . Smokeless tobacco: Never Used  Substance Use Topics  . Alcohol use: No   Objective:   BP 140/80 (BP Location: Left Arm, Patient Position: Sitting, Cuff Size: Large)   Pulse 92   Temp (!) 97.5 F (36.4 C) (Oral)   Resp 16   Wt 208 lb (94.3 kg)   SpO2 98% Comment: room air  BMI 29.84 kg/m  Vitals:   12/22/17 1424  BP: 140/80  Pulse: 92  Resp: 16  Temp: (!) 97.5 F (36.4 C)  TempSrc: Oral  SpO2: 98%  Weight: 208 lb (94.3 kg)     Physical Exam   General Appearance:    Alert, cooperative, no distress  HENT:   neck without nodes and sinuses nontender. Left ear canal swollen and and tender with tender anterior auricular nodes.   Eyes:    PERRL, conjunctiva/corneas clear, EOM's intact       Lungs:     Clear to auscultation bilaterally, respirations unlabored  Heart:    Regular rate and rhythm  Neurologic:   Awake, alert, oriented x 3. No apparent focal neurological           defect.           Assessment & Plan:     1. Panic attacks He is going to work on cutting back to three a day. Approved early refill, but advised will no longer be able  approve 90 day prescriptions. He is no taking escitalopram consistently.  - LORazepam (ATIVAN) 1 MG tablet; take 1 tablet by mouth UP TO four times a day if needed for anxiety  Dispense: 90 tablet; Refill: 3  2. Acute reaction to situational stress  - LORazepam (ATIVAN) 1 MG tablet; take 1 tablet by mouth UP TO four times a day if needed for anxiety  Dispense: 90 tablet; Refill: 3  3. Acute otitis externa of left ear, unspecified type  - neomycin-polymyxin-hydrocortisone (CORTISPORIN) OTIC solution; Place 3 drops into the left ear 4 (four) times daily for 7 days.  Dispense: 10 mL; Refill: 0 - amoxicillin (AMOXIL) 500 MG capsule; Take 2 capsules (1,000 mg total) by mouth 2 (two) times daily for 7 days.  Dispense: 28 capsule; Refill: 0  Call if symptoms change or if not rapidly improving.         Mila Merryonald Fisher, MD  [facility]

## 2017-12-28 ENCOUNTER — Other Ambulatory Visit: Payer: Self-pay | Admitting: Family Medicine

## 2017-12-28 DIAGNOSIS — G8929 Other chronic pain: Secondary | ICD-10-CM

## 2017-12-28 DIAGNOSIS — M545 Low back pain: Principal | ICD-10-CM

## 2017-12-29 ENCOUNTER — Other Ambulatory Visit: Payer: Self-pay | Admitting: Family Medicine

## 2017-12-29 ENCOUNTER — Ambulatory Visit: Payer: Self-pay | Admitting: Family Medicine

## 2017-12-29 DIAGNOSIS — G8929 Other chronic pain: Secondary | ICD-10-CM

## 2017-12-29 DIAGNOSIS — M545 Low back pain: Principal | ICD-10-CM

## 2017-12-29 MED ORDER — HYDROCODONE-ACETAMINOPHEN 10-325 MG PO TABS: 1.0000 | ORAL_TABLET | Freq: Four times a day (QID) | ORAL | 0 refills | Status: DC | PRN

## 2017-12-29 NOTE — Telephone Encounter (Signed)
Please review

## 2018-01-14 ENCOUNTER — Other Ambulatory Visit: Payer: Self-pay | Admitting: Family Medicine

## 2018-01-14 DIAGNOSIS — M545 Low back pain: Principal | ICD-10-CM

## 2018-01-14 DIAGNOSIS — G8929 Other chronic pain: Secondary | ICD-10-CM

## 2018-01-15 ENCOUNTER — Telehealth: Payer: Self-pay | Admitting: Family Medicine

## 2018-01-15 MED ORDER — HYDROCODONE-ACETAMINOPHEN 10-325 MG PO TABS: 1.0000 | ORAL_TABLET | Freq: Four times a day (QID) | ORAL | 0 refills | Status: DC | PRN

## 2018-01-15 NOTE — Telephone Encounter (Signed)
Pt called back saying the pharmacy will not fill his rx until tomorrow and he is completely out and is in pain.Dylan [name].  Please advise [phone]  Barth Kirksteri

## 2018-01-15 NOTE — Telephone Encounter (Signed)
Pt contacted office for refill request on the following medications:  HYDROcodone-acetaminophen (NORCO) 10-325 MG tablet   [facility] [place]  Last Rx: 12/29/17 LOV: 12/22/17 Pt stated that he ran out of medication over the weekend and is requesting the Rx be sent today if possible with instructions that it is ok to fill today. Please advise. Thanks TNP

## 2018-01-15 NOTE — Telephone Encounter (Signed)
Please advise early refill? 

## 2018-01-29 ENCOUNTER — Encounter: Payer: Self-pay | Admitting: Emergency Medicine

## 2018-01-29 ENCOUNTER — Emergency Department
Admission: EM | Admit: 2018-01-29 | Discharge: 2018-01-29 | Disposition: A | Discharge status: Home / Self Care | Payer: Medicare Other | Attending: Emergency Medicine | Admitting: Emergency Medicine

## 2018-01-29 ENCOUNTER — Telehealth: Payer: Self-pay | Admitting: Family Medicine

## 2018-01-29 DIAGNOSIS — F419 Anxiety disorder, unspecified: Secondary | ICD-10-CM | POA: Diagnosis not present

## 2018-01-29 DIAGNOSIS — M545 Low back pain: Principal | ICD-10-CM

## 2018-01-29 DIAGNOSIS — F329 Major depressive disorder, single episode, unspecified: Secondary | ICD-10-CM | POA: Insufficient documentation

## 2018-01-29 DIAGNOSIS — G8929 Other chronic pain: Secondary | ICD-10-CM

## 2018-01-29 DIAGNOSIS — F410 Panic disorder [episodic paroxysmal anxiety] without agoraphobia: Secondary | ICD-10-CM | POA: Diagnosis present

## 2018-01-29 DIAGNOSIS — I10 Essential (primary) hypertension: Secondary | ICD-10-CM | POA: Insufficient documentation

## 2018-01-29 DIAGNOSIS — R064 Hyperventilation: Secondary | ICD-10-CM | POA: Diagnosis not present

## 2018-01-29 MED ORDER — LORAZEPAM 1 MG PO TABS: 1.0000 mg | ORAL_TABLET | Freq: Three times a day (TID) | ORAL | 0 refills | Status: DC | PRN

## 2018-01-29 MED ORDER — DIAZEPAM 5 MG PO TABS
10.0000 mg | ORAL_TABLET | Freq: Once | ORAL | Status: AC
  Administered 2018-01-29: 10 mg via ORAL
  Filled 2018-01-29: qty 2

## 2018-01-29 NOTE — ED Notes (Addendum)
Per Dr. Mayford KnifeWilliams no protocols at this time.   Pt is alert, oriented, ambulated back to 19H.

## 2018-01-29 NOTE — Telephone Encounter (Signed)
Pt's son Jomarie LongsJoseph stated that pt ran out of his LORazepam (ATIVAN) 1 MG tablet over the weekend and [facility] [place] [facility]'t fill the medication until 01/31/18 without permission from Dr. Sherrie MustacheFisher to release the medication early. Jomarie LongsJoseph stated that pt is going through withdrawals because he hasn't had this medication and is requesting that we contact [facility]. Jomarie LongsJoseph stated that he is concerned about pt because of the withdrawals pt isn't eating or sleeping. Please advise. Thanks TNP

## 2018-01-29 NOTE — Telephone Encounter (Signed)
Patient walked into the office after being discharged from the emergency room. He is requesting that we call his pharmacy to give the ok for him to fill the Lorazepam prescription early. I consulted with Dr. Sherrie MustacheFisher who gave the "ok" for patient to fill the Lorazepam early. Per Dr. Sherrie MustacheFisher, patient needs to follow up in the office for an appointment within 10-14 days. I called the pharmacy and gave a verbal "ok" to fill Lorazepam.  I advised patient of this and appointment was scheduled 02/09/18 at 3:20pm. Patient is also requesting a refill on Hydrocodone- Acetaminophen. He says he only has 8 pills left, and he was told that he couldn't get that prescription until 02/01/2018.

## 2018-01-29 NOTE — Telephone Encounter (Signed)
Patient currently in ED, will wait for ED discharge before contacting pharmacy or patient.

## 2018-01-29 NOTE — Telephone Encounter (Signed)
Patient called back stating he is having a severe panic attack to where he us unable to breath. He states he has been out of his medication for the past 2 days and is having withdrawals. He admits that he has been taking more pills lately due to worsening Anxiety and Depression. Patients breathing over the phone was labored and he was hyperventilating. Patient told me he is also having severe low back pain. Patient asked me to call 911, because he was having trouble breaking and couldn't control this panic attack. I placed patient on hold and called 911. After calling and talking to the  911 operator, I remained on the phone with the patient until EMS arrived.

## 2018-01-29 NOTE — ED Notes (Addendum)
Dr. Mayford KnifeWilliams at bedside talking to pt. Pt states he takes ativan, has been out x few days. Denies any sleep recently. States panic attacks since being out. States "the breathing doesn't help it." Talking in complete sentences. Pt is hyperventilating but this RN notices he is able to stop hyperventilating on his own without instruction. Taking shallow breaths, not deep slow breaths.
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
## 2018-01-29 NOTE — Telephone Encounter (Signed)
Please advise 

## 2018-01-29 NOTE — ED Triage Notes (Signed)
Patient presents to ED via POV from home due to panic attacks. Patient states "I'm going through withdrawal from ativan". Patient reports being out of his pain medication as well. Patient is hyperventilating in triage. Patient reports wanting detox. Denies HI or SI.

## 2018-01-29 NOTE — Telephone Encounter (Signed)
Please advise pharmacy they may fill lorazepam now. Advise patient he needs to schedule o.v. For follow up in the next week or two.

## 2018-01-30 ENCOUNTER — Other Ambulatory Visit: Payer: Self-pay | Admitting: Family Medicine

## 2018-01-30 DIAGNOSIS — G8929 Other chronic pain: Secondary | ICD-10-CM

## 2018-01-30 DIAGNOSIS — M545 Low back pain: Principal | ICD-10-CM

## 2018-01-31 MED ORDER — HYDROCODONE-ACETAMINOPHEN 10-325 MG PO TABS: 1.0000 | ORAL_TABLET | Freq: Four times a day (QID) | ORAL | 0 refills | Status: DC | PRN

## 2018-01-31 NOTE — Telephone Encounter (Signed)
Pt contacted office for refill request on the following medications:  HYDROcodone-acetaminophen (NORCO) 10-325 MG tablet   [facility] [place]  Last Rx: 01/15/18 LOV: 12/22/17 Pt stated that he only has one pill left and he is starting to panic that he is going to run out before the Rx is sent to the pharmacy. Pt stated that when he spoke with [place] earlier this week he was advised that the medication would be filled before he ran out so he wouldn't panic and go through withdrawals. Pt also stated that Dr. Sherrie MustacheFisher told him he would fill the medication early for him. Please advise. Thanks TNP

## 2018-01-31 NOTE — Telephone Encounter (Signed)
Pt called saying the pharmacy received the order for hydrocodone but that he cant get it until the 20 th.  He says he's afraid he will go into withdrawals again if he has to wait until the 20 th to pick up the hydrocodone.  teri

## 2018-01-31 NOTE — Telephone Encounter (Signed)
Pt and his son called asking for an update on the medication. Please advise. Thanks TNP

## 2018-01-31 NOTE — Telephone Encounter (Signed)
I called pharmacy and gave verbal OK to fill prescription early.

## 2018-01-31 NOTE — Telephone Encounter (Signed)
Prescription was sent into the pharmacy by Dr. Sherrie MustacheFisher today in a separate message. I called pharmacy and gave a verbal OK to fill medication early.

## 2018-01-31 NOTE — Telephone Encounter (Signed)
Please advise 
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
## 2018-02-09 ENCOUNTER — Encounter: Payer: Self-pay | Admitting: Family Medicine

## 2018-02-09 ENCOUNTER — Ambulatory Visit (INDEPENDENT_AMBULATORY_CARE_PROVIDER_SITE_OTHER): Payer: Medicare Other | Admitting: Family Medicine

## 2018-02-09 VITALS — BP 140/90 | HR 109 | Temp 98.1°F | Wt 206.6 lb

## 2018-02-09 DIAGNOSIS — F410 Panic disorder [episodic paroxysmal anxiety] without agoraphobia: Secondary | ICD-10-CM | POA: Diagnosis not present

## 2018-02-09 DIAGNOSIS — F329 Major depressive disorder, single episode, unspecified: Secondary | ICD-10-CM

## 2018-02-09 DIAGNOSIS — M5030 Other cervical disc degeneration, unspecified cervical region: Secondary | ICD-10-CM

## 2018-02-09 DIAGNOSIS — M5126 Other intervertebral disc displacement, lumbar region: Secondary | ICD-10-CM

## 2018-02-09 DIAGNOSIS — F32A Depression, unspecified: Secondary | ICD-10-CM

## 2018-02-15 ENCOUNTER — Other Ambulatory Visit: Payer: Self-pay | Admitting: Family Medicine

## 2018-02-15 DIAGNOSIS — G8929 Other chronic pain: Secondary | ICD-10-CM

## 2018-02-15 DIAGNOSIS — M545 Low back pain: Principal | ICD-10-CM

## 2018-02-16 MED ORDER — HYDROCODONE-ACETAMINOPHEN 10-325 MG PO TABS: 1.0000 | ORAL_TABLET | Freq: Four times a day (QID) | ORAL | 0 refills | Status: DC | PRN

## 2018-02-16 NOTE — Telephone Encounter (Signed)
Pt contacted office for refill request on the following medications:  HYDROcodone-acetaminophen (NORCO) 10-325 MG tablet  [facility]  Pt wanted to make sure Dr. Sherrie MustacheFisher saw his refill request that was sent via MyChart. Pt stated that he has an appt with Pain Management but not until 03/13/18. Please advise. Thanks TNP

## 2018-03-04 ENCOUNTER — Other Ambulatory Visit: Payer: Self-pay | Admitting: Family Medicine

## 2018-03-04 DIAGNOSIS — G8929 Other chronic pain: Secondary | ICD-10-CM

## 2018-03-04 DIAGNOSIS — M545 Low back pain: Principal | ICD-10-CM

## 2018-03-05 ENCOUNTER — Other Ambulatory Visit: Payer: Self-pay | Admitting: Family Medicine

## 2018-03-05 DIAGNOSIS — F329 Major depressive disorder, single episode, unspecified: Secondary | ICD-10-CM

## 2018-03-05 DIAGNOSIS — F32A Depression, unspecified: Secondary | ICD-10-CM

## 2018-03-05 DIAGNOSIS — M545 Low back pain: Principal | ICD-10-CM

## 2018-03-05 DIAGNOSIS — G8929 Other chronic pain: Secondary | ICD-10-CM

## 2018-03-05 DIAGNOSIS — F410 Panic disorder [episodic paroxysmal anxiety] without agoraphobia: Secondary | ICD-10-CM

## 2018-03-06 ENCOUNTER — Telehealth: Payer: Self-pay | Admitting: Family Medicine

## 2018-03-06 MED ORDER — HYDROCODONE-ACETAMINOPHEN 10-325 MG PO TABS: 1.0000 | ORAL_TABLET | Freq: Four times a day (QID) | ORAL | 0 refills | Status: DC | PRN

## 2018-03-06 NOTE — Telephone Encounter (Signed)
OK to fill today

## 2018-03-06 NOTE — Telephone Encounter (Signed)
[facility] pharmacy was advised. Pharmacist stated this will be the last time she refills early for this patient at this [facility].

## 2018-03-06 NOTE — Telephone Encounter (Signed)
Please advise 

## 2018-03-06 NOTE — Telephone Encounter (Signed)
Pt stated that [facility] advised pt that they can't fill the medication until 03/08/18 without Dr. Theodis AguasFisher's permission to fill the medication today. Pt is requesting we contact [facility] asap because he is out of the medication. Please advise. Thanks TNP

## 2018-03-13 ENCOUNTER — Ambulatory Visit: Payer: Medicare Other | Admitting: Student in an Organized Health Care Education/Training Program

## 2018-03-14 ENCOUNTER — Other Ambulatory Visit: Payer: Self-pay | Admitting: Family Medicine

## 2018-03-14 DIAGNOSIS — F410 Panic disorder [episodic paroxysmal anxiety] without agoraphobia: Secondary | ICD-10-CM

## 2018-03-14 DIAGNOSIS — F430 Acute stress reaction: Secondary | ICD-10-CM

## 2018-03-23 ENCOUNTER — Ambulatory Visit (INDEPENDENT_AMBULATORY_CARE_PROVIDER_SITE_OTHER): Payer: Medicare Other | Admitting: Family Medicine

## 2018-03-23 ENCOUNTER — Encounter: Payer: Self-pay | Admitting: Family Medicine

## 2018-03-23 VITALS — BP 151/92 | HR 108 | Temp 97.7°F | Resp 20 | Wt 203.0 lb

## 2018-03-23 DIAGNOSIS — M545 Low back pain: Secondary | ICD-10-CM | POA: Diagnosis not present

## 2018-03-23 DIAGNOSIS — F32A Depression, unspecified: Secondary | ICD-10-CM

## 2018-03-23 DIAGNOSIS — G8929 Other chronic pain: Secondary | ICD-10-CM

## 2018-03-23 DIAGNOSIS — M5126 Other intervertebral disc displacement, lumbar region: Secondary | ICD-10-CM

## 2018-03-23 DIAGNOSIS — F329 Major depressive disorder, single episode, unspecified: Secondary | ICD-10-CM | POA: Diagnosis not present

## 2018-03-23 DIAGNOSIS — F419 Anxiety disorder, unspecified: Secondary | ICD-10-CM | POA: Diagnosis not present

## 2018-03-23 DIAGNOSIS — Z23 Encounter for immunization: Secondary | ICD-10-CM | POA: Diagnosis not present

## 2018-03-23 MED ORDER — HYDROCODONE-ACETAMINOPHEN 10-325 MG PO TABS: 1.0000 | ORAL_TABLET | Freq: Four times a day (QID) | ORAL | 0 refills | Status: DC | PRN
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
## 2018-03-23 NOTE — Patient Instructions (Addendum)
   Try an OTC electrical stimulator (TENS) unit   Alternate applying heat and ice every 4 hours.    Call the pain clinic at [phone] as soon as possible to schedule an appointment with Dr. Cherylann Ratel

## 2018-04-10 ENCOUNTER — Other Ambulatory Visit: Payer: Self-pay | Admitting: Family Medicine

## 2018-04-10 DIAGNOSIS — G8929 Other chronic pain: Secondary | ICD-10-CM

## 2018-04-10 DIAGNOSIS — M545 Low back pain: Principal | ICD-10-CM

## 2018-04-11 MED ORDER — HYDROCODONE-ACETAMINOPHEN 10-325 MG PO TABS: 1.0000 | ORAL_TABLET | Freq: Four times a day (QID) | ORAL | 0 refills | Status: DC | PRN

## 2018-04-30 ENCOUNTER — Other Ambulatory Visit: Payer: Self-pay | Admitting: Family Medicine

## 2018-05-01 MED ORDER — HYDROCODONE-ACETAMINOPHEN 10-325 MG PO TABS: 1.0000 | ORAL_TABLET | Freq: Four times a day (QID) | ORAL | 0 refills | Status: DC | PRN

## 2018-05-21 ENCOUNTER — Other Ambulatory Visit: Payer: Self-pay | Admitting: Family Medicine

## 2018-05-21 MED ORDER — HYDROCODONE-ACETAMINOPHEN 10-325 MG PO TABS: 1.0000 | ORAL_TABLET | Freq: Four times a day (QID) | ORAL | 0 refills | Status: DC | PRN

## 2018-05-24 ENCOUNTER — Ambulatory Visit (INDEPENDENT_AMBULATORY_CARE_PROVIDER_SITE_OTHER): Payer: Medicare Other

## 2018-05-24 VITALS — BP 130/88 | HR 95 | Temp 97.8°F | Ht 70.0 in | Wt 216.0 lb

## 2018-05-24 DIAGNOSIS — Z0000 Encounter for general adult medical examination without abnormal findings: Secondary | ICD-10-CM | POA: Diagnosis not present

## 2018-05-24 NOTE — Patient Instructions (Addendum)
[name] , Thank you for taking time to come for your Medicare Wellness Visit. I appreciate your ongoing commitment to your health goals. Please review the following plan we discussed and let me know if I can assist you in the future.   Screening recommendations/referrals: Colonoscopy: Up to date, due 06/2027 Recommended yearly ophthalmology/optometry visit for glaucoma screening and checkup Recommended yearly dental visit for hygiene and checkup  Vaccinations: Influenza vaccine: Up to date Pneumococcal vaccine: Not required until age 42. Tdap vaccine: Up to date, due 08/2018 Shingles vaccine: Pt declines today.     Advanced directives: Advance directive discussed with you today. Even though you declined this today please call our office should you need the paperwork or complete these forms.   Conditions/risks identified: Obesity; Recommend to remove any items from the home that may cause slips or trips.  Next appointment: 09/05/17 @ 3 PM with Dr Sherrie Mustache.   Preventive Care 40-64 Years, Male Preventive care refers to lifestyle choices and visits with your health care provider that can promote health and wellness. What does preventive care include?  A yearly physical exam. This is also called an annual well check.  Dental exams once or twice a year.  Routine eye exams. Ask your health care provider how often you should have your eyes checked.  Personal lifestyle choices, including:  Daily care of your teeth and gums.  Regular physical activity.  Eating a healthy diet.  Avoiding tobacco and drug use.  Limiting alcohol use.  Practicing safe sex.  Taking low-dose aspirin every day starting at age 77. What happens during an annual well check? The services and screenings done by your health care provider during your annual well check will depend on your age, overall health, lifestyle risk factors, and family history of disease. Counseling  Your health care provider may ask  you questions about your:  Alcohol use.  Tobacco use.  Drug use.  Emotional well-being.  Home and relationship well-being.  Sexual activity.  Eating habits.  Work and work Astronomer. Screening  You may have the following tests or measurements:  Height, weight, and BMI.  Blood pressure.  Lipid and cholesterol levels. These may be checked every 5 years, or more frequently if you are over 60 years old.  Skin check.  Lung cancer screening. You may have this screening every year starting at age 29 if you have a 30-pack-year history of smoking and currently smoke or have quit within the past 15 years.  Fecal occult blood test (FOBT) of the stool. You may have this test every year starting at age 35.  Flexible sigmoidoscopy or colonoscopy. You may have a sigmoidoscopy every 5 years or a colonoscopy every 10 years starting at age 51.  Prostate cancer screening. Recommendations will vary depending on your family history and other risks.  Hepatitis C blood test.  Hepatitis B blood test.  Sexually transmitted disease (STD) testing.  Diabetes screening. This is done by checking your blood sugar (glucose) after you have not eaten for a while (fasting). You may have this done every 1-3 years. Discuss your test results, treatment options, and if necessary, the need for more tests with your health care provider. Vaccines  Your health care provider may recommend certain vaccines, such as:  Influenza vaccine. This is recommended every year.  Tetanus, diphtheria, and acellular pertussis (Tdap, Td) vaccine. You may need a Td booster every 10 years.  Zoster vaccine. You may need this after age 35.  Pneumococcal 13-valent conjugate (  PCV13) vaccine. You may need this if you have certain conditions and have not been vaccinated.  Pneumococcal polysaccharide (PPSV23) vaccine. You may need one or two doses if you smoke cigarettes or if you have certain conditions. Talk to your health  care provider about which screenings and vaccines you need and how often you need them. This information is not intended to replace advice given to you by your health care provider. Make sure you discuss any questions you have with your health care provider. Document Released: 07/31/2015 Document Revised: 03/23/2016 Document Reviewed: 05/05/2015 Elsevier Interactive Patient Education  2017 [employer].  Fall Prevention in the Home Falls can cause injuries. They can happen to people of all ages. There are many things you can do to make your home safe and to help prevent falls. What can I do on the outside of my home?  Regularly fix the edges of walkways and driveways and fix any cracks.  Remove anything that might make you trip as you walk through a door, such as a raised step or threshold.  Trim any bushes or trees on the path to your home.  Use bright outdoor lighting.  Clear any walking paths of anything that might make someone trip, such as rocks or tools.  Regularly check to see if handrails are loose or broken. Make sure that both sides of any steps have handrails.  Any raised decks and porches should have guardrails on the edges.  Have any leaves, snow, or ice cleared regularly.  Use sand or salt on walking paths during winter.  Clean up any spills in your garage right away. This includes oil or grease spills. What can I do in the bathroom?  Use night lights.  Install grab bars by the toilet and in the tub and shower. Do not use towel bars as grab bars.  Use non-skid mats or decals in the tub or shower.  If you need to sit down in the shower, use a plastic, non-slip stool.  Keep the floor dry. Clean up any water that spills on the floor as soon as it happens.  Remove soap buildup in the tub or shower regularly.  Attach bath mats securely with double-sided non-slip rug tape.  Do not have throw rugs and other things on the floor that can make you trip. What can I do  in the bedroom?  Use night lights.  Make sure that you have a light by your bed that is easy to reach.  Do not use any sheets or blankets that are too big for your bed. They should not hang down onto the floor.  Have a firm chair that has side arms. You can use this for support while you get dressed.  Do not have throw rugs and other things on the floor that can make you trip. What can I do in the kitchen?  Clean up any spills right away.  Avoid walking on wet floors.  Keep items that you use a lot in easy-to-reach places.  If you need to reach something above you, use a strong step stool that has a grab bar.  Keep electrical cords out of the way.  Do not use floor polish or wax that makes floors slippery. If you must use wax, use non-skid floor wax.  Do not have throw rugs and other things on the floor that can make you trip. What can I do with my stairs?  Do not leave any items on the stairs.  Make  sure that there are handrails on both sides of the stairs and use them. Fix handrails that are broken or loose. Make sure that handrails are as long as the stairways.  Check any carpeting to make sure that it is firmly attached to the stairs. Fix any carpet that is loose or worn.  Avoid having throw rugs at the top or bottom of the stairs. If you do have throw rugs, attach them to the floor with carpet tape.  Make sure that you have a light switch at the top of the stairs and the bottom of the stairs. If you do not have them, ask someone to add them for you. What else can I do to help prevent falls?  Wear shoes that:  Do not have high heels.  Have rubber bottoms.  Are comfortable and fit you well.  Are closed at the toe. Do not wear sandals.  If you use a stepladder:  Make sure that it is fully opened. Do not climb a closed stepladder.  Make sure that both sides of the stepladder are locked into place.  Ask someone to hold it for you, if possible.  Clearly mark  and make sure that you can see:  Any grab bars or handrails.  First and last steps.  Where the edge of each step is.  Use tools that help you move around (mobility aids) if they are needed. These include:  Canes.  Walkers.  Scooters.  Crutches.  Turn on the lights when you go into a dark area. Replace any light bulbs as soon as they burn out.  Set up your furniture so you have a clear path. Avoid moving your furniture around.  If any of your floors are uneven, fix them.  If there are any pets around you, be aware of where they are.  Review your medicines with your doctor. Some medicines can make you feel dizzy. This can increase your chance of falling. Ask your doctor what other things that you can do to help prevent falls. This information is not intended to replace advice given to you by your health care provider. Make sure you discuss any questions you have with your health care provider. Document Released: 04/30/2009 Document Revised: 12/10/2015 Document Reviewed: 08/08/2014 Elsevier Interactive Patient Education  2017 [employer].

## 2018-05-24 NOTE — Progress Notes (Signed)
Subjective:   Youcef Klas is a 62 y.o. male who presents for an Initial Medicare Annual Wellness Visit.  Review of Systems  N/A  Cardiac Risk Factors include: advanced age (>71men, >55 women);male gender;obesity (BMI >30kg/m2);hypertension;dyslipidemia    Objective:    Today's Vitals   05/24/18 1427  BP: 130/88  Pulse: 95  Temp: 97.8 F (36.6 C)  TempSrc: Oral  Weight: 216 lb (98 kg)  Height: 5\' 10"  (1.778 m)  PainSc: 0-No pain   Body mass index is 30.99 kg/m.  Advanced Directives 05/24/2018 07/05/2017 04/20/2016  Does Patient Have a Medical Advance Directive? No No No  Would patient like information on creating a medical advance directive? No - Patient declined No - Patient declined -  Pre-existing out of facility DNR order (yellow form or pink MOST form) - Yellow form placed in chart (order not valid for inpatient use) -    Current Medications (verified) Outpatient Encounter Medications as of 05/24/2018  Medication Sig  . cyclobenzaprine (FLEXERIL) 10 MG tablet TAKE 1 TABLET BY MOUTH 3  TIMES DAILY AS NEEDED FOR  MUSCLE SPASM(S)  . diphenhydramine-acetaminophen (TYLENOL PM) 25-500 MG TABS Take 1 tablet by mouth at bedtime as needed (pain). Reported on 01/01/2016  . escitalopram (LEXAPRO) 20 MG tablet Take 1 tablet (20 mg total) by mouth daily. Every day  . HYDROcodone-acetaminophen (NORCO) 10-325 MG tablet Take 1-2 tablets by mouth every 6 (six) hours as needed for severe pain.  Marland Kitchen LORazepam (ATIVAN) 1 MG tablet TAKE 1 TABLET BY MOUTH UP TO FOUR TIMES DAILY IF NEEDED FOR ANXIETY (Patient taking differently: TAKE 1 TABLET BY MOUTH UP TO FOUR TIMES DAILY IF NEEDED FOR ANXIETY)  . Melatonin 3 MG CAPS Take 3 mg by mouth as needed.   . naproxen sodium (ALEVE) 220 MG tablet Take 220 mg by mouth daily as needed.  Marland Kitchen omeprazole (PRILOSEC) 40 MG capsule Take 1 capsule (40 mg total) by mouth daily.  . simvastatin (ZOCOR) 40 MG tablet Take 1 tablet (40 mg total) by mouth daily.  Marland Kitchen  aspirin 81 MG tablet Take 81 mg by mouth daily. Reported on 01/01/2016  . fluticasone (FLONASE) 50 MCG/ACT nasal spray fluticasone propionate 50 mcg/actuation nasal spray,suspension  . simethicone (MYLICON) 80 MG chewable tablet Chew 80 mg by mouth every 6 (six) hours as needed for flatulence.  . TESTOSTERONE NA Take 3 tablets by mouth daily. Nugenix   No facility-administered encounter medications on file as of 05/24/2018.     Allergies (verified) Morphine and related; Belsomra  [suvorexant]; Duloxetine; Meloxicam; Morphine; and Other   History: Past Medical History:  Diagnosis Date  . History of chicken pox   . History of measles as a child   . History of mumps as a child   . Hypercholesteremia   . Hypertension   . Seizures (HCC)    blacked out in shower when coming off benzo - 'withdrawals'  . Shortness of breath dyspnea    with exertion ? d/t stress & anxiety   Past Surgical History:  Procedure Laterality Date  . BACK SURGERY    . COLONOSCOPY WITH PROPOFOL N/A 07/05/2017   Procedure: COLONOSCOPY WITH PROPOFOL;  Surgeon: Wyline Mood, MD;  Location: [facility] ENDOSCOPY;  Service: Gastroenterology;  Laterality: N/A;  . JOINT REPLACEMENT    . KNEE SURGERY Right 1993  . LUMBAR DISC SURGERY  03/18/2015   R L4-5 micro discectomy, Dr. Lovell Sheehan  . LUMBAR LAMINECTOMY/DECOMPRESSION MICRODISCECTOMY Right 03/18/2015   Procedure: Right Lumbar Four-Five Microdiscectomy;  Surgeon: Tressie Stalker, MD;  Location: [facility] NEURO ORS;  Service: Neurosurgery;  Laterality: Right;  Right L45 microdiskectomy  . LUNG SURGERY Left 2009   Lung Collapse: left chest tube  . ROTATOR CUFF REPAIR  2004   had rotator cuff injury and surgery was performed  . TONSILLECTOMY    . TONSILLECTOMY AND ADENOIDECTOMY  1960   Family History  Problem Relation Age of Onset  . Alzheimer's disease Mother   . Stroke Mother   . Heart disease Father   . Congestive Heart Failure Brother   . CAD Brother   . Congestive Heart  Failure Sister    Social History   Socioeconomic History  . Marital status: Divorced    Spouse name: Not on file  . Number of children: 3  . Years of education: Not on file  . Highest education level: High school graduate  Occupational History  . Occupation: disability  Social Needs  . Financial resource strain: Somewhat hard  . Food insecurity:    Worry: Never true    Inability: Never true  . Transportation needs:    Medical: No    Non-medical: No  Tobacco Use  . Smoking status: Never Smoker  . Smokeless tobacco: Never Used  Substance and Sexual Activity  . Alcohol use: Yes    Comment: occasionally 1-2 beers a month  . Drug use: No  . Sexual activity: Yes  Lifestyle  . Physical activity:    Days per week: 0 days    Minutes per session: 0 min  . Stress: To some extent  Relationships  . Social connections:    Talks on phone: Patient refused    Gets together: Patient refused    Attends religious service: Patient refused    Active member of club or organization: Patient refused    Attends meetings of clubs or organizations: Patient refused    Relationship status: Patient refused  Other Topics Concern  . Not on file  Social History Narrative   ** Merged History Encounter **       Tobacco Counseling Counseling given: Not Answered   Clinical Intake:  Pre-visit preparation completed: Yes  Pain : No/denies pain Pain Score: 0-No pain(Has chronic back pain that hurts with movement or standing. )     Nutritional Status: BMI > 30  Obese Nutritional Risks: None Diabetes: No  How often do you need to have someone help you when you read instructions, pamphlets, or other written materials from your doctor or pharmacy?: 3 - Sometimes(Needs an eye exam to update prescription. Pt states he is unable to afford an eye exam and his insurance doesnt cover vision. Will send a referral to [facility] team for assistance. )  Interpreter Needed?: No  Information entered by ::  [facility], LPN  Activities of Daily Living In your present state of health, do you have any difficulty performing the following activities: 05/24/2018  Hearing? Y  Comment Does not wear hearing aids.   Vision? Y  Comment Needs to have an eye exam, referral sent to [facility] team to assist with setting up an apt. Pt states he is unable to afford an eye exam.   Difficulty concentrating or making decisions? Y  Walking or climbing stairs? Y  Comment Due to balance issues.   Dressing or bathing? N  Doing errands, shopping? N  Preparing Food and eating ? N  Using the Toilet? N  In the past six months, have you accidently leaked urine? N  Do you have  problems with loss of bowel control? N  Managing your Medications? N  Managing your Finances? N  Housekeeping or managing your Housekeeping? N  Some recent data might be hidden     Immunizations and Health Maintenance Immunization History  Administered Date(s) Administered  . Influenza Split 04/07/2008  . Influenza,inj,Quad PF,6+ Mos 04/05/2013, 04/15/2014, 04/01/2015, 04/20/2016, 04/25/2017, 03/23/2018  . Tdap 09/12/2008  . Zoster 05/06/2016   There are no preventive care reminders to display for this patient.  Patient Care Team: Malva Limes, MD as PCP - General (Family Medicine) Tressie Stalker, MD as Consulting Physician (Neurosurgery)  Indicate any recent Medical Services you may have received from other than [facility] providers in the past year (date may be approximate).    Assessment:   This is a routine wellness examination for Phat.  Hearing/Vision screen No exam data present  Dietary issues and exercise activities discussed: Current Exercise Habits: The patient does not participate in regular exercise at present, Exercise limited by: orthopedic condition(s)  Goals    . Prevent falls     Recommend to remove any items from the home that may cause slips or trips.          Depression Screen PHQ 2/9 Scores 05/24/2018  04/25/2017 04/20/2016 04/01/2015  PHQ - 2 Score 5 5 3 5   PHQ- 9 Score 21 19 18 20     Fall Risk Fall Risk  05/24/2018 04/01/2015  Falls in the past year? 1 Yes  Number falls in past yr: 1 2 or more  Injury with Fall? 0 Yes  Risk for fall due to : Other (Comment) -  Risk for fall due to: Comment "Looses balance when climbing steps." -   FALL RISK PREVENTION PERTAINING TO THE HOME:  Any stairs in or around the home WITH handrails? No  Home free of loose throw rugs in walkways, pet beds, electrical cords, etc? Yes  Adequate lighting in your home to reduce risk of falls? Yes   ASSISTIVE DEVICES UTILIZED TO PREVENT FALLS:  Life alert? No  Use of a cane, walker or w/c? No  Grab bars in the bathroom? No  Shower chair or bench in shower? No  Elevated toilet seat or a handicapped toilet? Yes    TIMED UP AND GO:  Was the test performed? No .     Cognitive Function:      6CIT Screen 05/24/2018  What Year? 0 points  What month? 0 points  What time? 0 points  Count back from 20 0 points  Months in reverse 0 points  Repeat phrase 2 points  Total Score 2    Screening Tests Health Maintenance  Topic Date Due  . TETANUS/TDAP  09/12/2018  . COLONOSCOPY  07/06/2027  . INFLUENZA VACCINE  Completed  . Hepatitis C Screening  Completed  . HIV Screening  Completed    Qualifies for Shingles Vaccine? Yes  Zostavax completed 05/06/16. Due for Shingrix. Education has been provided regarding the importance of this vaccine. Pt has been advised to call insurance company to determine out of pocket expense. Advised may also receive vaccine at local pharmacy or Health Dept. Verbalized acceptance and understanding.  Tdap: Up to date  Flu Vaccine: Up to date   Cancer Screenings:  Colorectal Screening: Completed 07/05/17. Repeat every 10 years.  Lung Cancer Screening: (Low Dose CT Chest recommended if Age 4-80 years, 30 pack-year currently smoking OR have quit w/in 15years.) does not qualify.    Additional Screening:  Hepatitis C Screening:  Up to date  Vision Screening: Recommended annual ophthalmology exams for early detection of glaucoma and other disorders of the eye.  Dental Screening: Recommended annual dental exams for proper oral hygiene  Community Resource Referral:  CRR required this visit?  No        Plan:  I have personally reviewed and addressed the Medicare Annual Wellness questionnaire and have noted the following in the patient's chart:  A. Medical and social history B. Use of alcohol, tobacco or illicit drugs  C. Current medications and supplements D. Functional ability and status E.  Nutritional status F.  Physical activity G. Advance directives H. List of other physicians I.  Hospitalizations, surgeries, and ER visits in previous 12 months J.  Vitals K. Screenings such as hearing and vision if needed, cognitive and depression L. Referrals and appointments - none  In addition, I have reviewed and discussed with patient certain preventive protocols, quality metrics, and best practice recommendations. A written personalized care plan for preventive services as well as general preventive health recommendations were provided to patient.  See attached scanned questionnaire for additional information.   Signed,  Hyacinth Meeker, LPN Nurse Health Advisor   Nurse Recommendations: None.

## 2018-06-05 ENCOUNTER — Encounter: Payer: Self-pay | Admitting: Family Medicine

## 2018-06-05 ENCOUNTER — Ambulatory Visit (INDEPENDENT_AMBULATORY_CARE_PROVIDER_SITE_OTHER): Payer: Medicare Other | Admitting: Family Medicine

## 2018-06-05 VITALS — BP 142/100 | HR 100 | Temp 97.7°F | Resp 16 | Ht 70.0 in | Wt 213.0 lb

## 2018-06-05 DIAGNOSIS — E669 Obesity, unspecified: Secondary | ICD-10-CM

## 2018-06-05 DIAGNOSIS — R030 Elevated blood-pressure reading, without diagnosis of hypertension: Secondary | ICD-10-CM | POA: Diagnosis not present

## 2018-06-05 DIAGNOSIS — Z0000 Encounter for general adult medical examination without abnormal findings: Secondary | ICD-10-CM | POA: Diagnosis not present

## 2018-06-05 DIAGNOSIS — E782 Mixed hyperlipidemia: Secondary | ICD-10-CM | POA: Diagnosis not present

## 2018-06-05 DIAGNOSIS — Z6830 Body mass index (BMI) 30.0-30.9, adult: Secondary | ICD-10-CM

## 2018-06-05 DIAGNOSIS — M545 Low back pain, unspecified: Secondary | ICD-10-CM

## 2018-06-05 DIAGNOSIS — Z125 Encounter for screening for malignant neoplasm of prostate: Secondary | ICD-10-CM

## 2018-06-05 MED ORDER — SIMVASTATIN 40 MG PO TABS: 40.0000 mg | ORAL_TABLET | Freq: Every day | ORAL | 4 refills | Status: DC

## 2018-06-05 MED ORDER — CYCLOBENZAPRINE HCL 10 MG PO TABS: ORAL_TABLET | ORAL | 4 refills | Status: DC

## 2018-06-05 NOTE — Patient Instructions (Addendum)
We will contact you in December when it is time to get your labs done. Be sure to start back on simvastatin as soon as the mail order prescription arrives.   DASH Eating Plan DASH stands for "Dietary Approaches to Stop Hypertension." The DASH eating plan is a healthy eating plan that has been shown to reduce high blood pressure (hypertension). It may also reduce your risk for type 2 diabetes, heart disease, and stroke. The DASH eating plan may also help with weight loss. What are tips for following this plan? General guidelines  Avoid eating more than 2,300 mg (milligrams) of salt (sodium) a day. If you have hypertension, you may need to reduce your sodium intake to 1,500 mg a day.  Limit alcohol intake to no more than 1 drink a day for nonpregnant women and 2 drinks a day for men. One drink equals 12 oz of beer, 5 oz of wine, or 1 oz of hard liquor.  Work with your health care provider to maintain a healthy body weight or to lose weight. Ask what an ideal weight is for you.  Get at least 30 minutes of exercise that causes your heart to beat faster (aerobic exercise) most days of the week. Activities may include walking, swimming, or biking.  Work with your health care provider or diet and nutrition specialist (dietitian) to adjust your eating plan to your individual calorie needs. Reading food labels  Check food labels for the amount of sodium per serving. Choose foods with less than 5 percent of the Daily Value of sodium. Generally, foods with less than 300 mg of sodium per serving fit into this eating plan.  To find whole grains, look for the word "whole" as the first word in the ingredient list. Shopping  Buy products labeled as "low-sodium" or "no salt added."  Buy fresh foods. Avoid canned foods and premade or frozen meals. Cooking  Avoid adding salt when cooking. Use salt-free seasonings or herbs instead of table salt or sea salt. Check with your health care provider or pharmacist  before using salt substitutes.  Do not fry foods. Cook foods using healthy methods such as baking, boiling, grilling, and broiling instead.  Cook with heart-healthy oils, such as olive, canola, soybean, or sunflower oil. Meal planning   Eat a balanced diet that includes: ? 5 or more servings of fruits and vegetables each day. At each meal, try to fill half of your plate with fruits and vegetables. ? Up to 6-8 servings of whole grains each day. ? Less than 6 oz of lean meat, poultry, or fish each day. A 3-oz serving of meat is about the same size as a deck of cards. One egg equals 1 oz. ? 2 servings of low-fat dairy each day. ? A serving of nuts, seeds, or beans 5 times each week. ? Heart-healthy fats. Healthy fats called Omega-3 fatty acids are found in foods such as flaxseeds and coldwater fish, like sardines, salmon, and mackerel.  Limit how much you eat of the following: ? Canned or prepackaged foods. ? Food that is high in trans fat, such as fried foods. ? Food that is high in saturated fat, such as fatty meat. ? Sweets, desserts, sugary drinks, and other foods with added sugar. ? Full-fat dairy products.  Do not salt foods before eating.  Try to eat at least 2 vegetarian meals each week.  Eat more home-cooked food and less restaurant, buffet, and fast food.  When eating at a restaurant, ask  that your food be prepared with less salt or no salt, if possible. What foods are recommended? The items listed may not be a complete list. Talk with your dietitian about what dietary choices are best for you. Grains Whole-grain or whole-wheat bread. Whole-grain or whole-wheat pasta. Brown rice. [place]. Bulgur. Whole-grain and low-sodium cereals. Pita bread. Low-fat, low-sodium crackers. Whole-wheat flour tortillas. Vegetables Fresh or frozen vegetables (raw, steamed, roasted, or grilled). Low-sodium or reduced-sodium tomato and vegetable juice. Low-sodium or reduced-sodium tomato  sauce and tomato paste. Low-sodium or reduced-sodium canned vegetables. Fruits All fresh, dried, or frozen fruit. Canned fruit in natural juice (without added sugar). Meat and other protein foods Skinless chicken or [place]. Ground chicken or [place]. Pork with fat trimmed off. Fish and seafood. Egg whites. Dried beans, peas, or lentils. Unsalted nuts, nut butters, and seeds. Unsalted canned beans. Lean cuts of beef with fat trimmed off. Low-sodium, lean deli meat. Dairy Low-fat (1%) or fat-free (skim) milk. Fat-free, low-fat, or reduced-fat cheeses. Nonfat, low-sodium ricotta or cottage cheese. Low-fat or nonfat yogurt. Low-fat, low-sodium cheese. Fats and oils Soft margarine without trans fats. Vegetable oil. Low-fat, reduced-fat, or light mayonnaise and salad dressings (reduced-sodium). Canola, safflower, olive, soybean, and sunflower oils. Avocado. Seasoning and other foods Herbs. Spices. Seasoning mixes without salt. Unsalted popcorn and pretzels. Fat-free sweets. What foods are not recommended? The items listed may not be a complete list. Talk with your dietitian about what dietary choices are best for you. Grains Baked goods made with fat, such as croissants, muffins, or some breads. Dry pasta or rice meal packs. Vegetables Creamed or fried vegetables. Vegetables in a cheese sauce. Regular canned vegetables (not low-sodium or reduced-sodium). Regular canned tomato sauce and paste (not low-sodium or reduced-sodium). Regular tomato and vegetable juice (not low-sodium or reduced-sodium). Angie Fava. Olives. Fruits Canned fruit in a light or heavy syrup. Fried fruit. Fruit in cream or butter sauce. Meat and other protein foods Fatty cuts of meat. Ribs. Fried meat. Berniece Salines. Sausage. Bologna and other processed lunch meats. Salami. Fatback. Hotdogs. Bratwurst. Salted nuts and seeds. Canned beans with added salt. Canned or smoked fish. Whole eggs or egg yolks. Chicken or [place] with skin. Dairy Whole  or 2% milk, cream, and half-and-half. Whole or full-fat cream cheese. Whole-fat or sweetened yogurt. Full-fat cheese. Nondairy creamers. Whipped toppings. Processed cheese and cheese spreads. Fats and oils Butter. Stick margarine. Lard. Shortening. Ghee. Bacon fat. Tropical oils, such as coconut, palm kernel, or palm oil. Seasoning and other foods Salted popcorn and pretzels. Onion salt, garlic salt, seasoned salt, table salt, and sea salt. Worcestershire sauce. Tartar sauce. Barbecue sauce. Teriyaki sauce. Soy sauce, including reduced-sodium. Steak sauce. Canned and packaged gravies. Fish sauce. Oyster sauce. Cocktail sauce. Horseradish that you find on the shelf. Ketchup. Mustard. Meat flavorings and tenderizers. Bouillon cubes. Hot sauce and Tabasco sauce. Premade or packaged marinades. Premade or packaged taco seasonings. Relishes. Regular salad dressings. Where to find more information:  National Heart, Lung, and [place]: https://wilson-eaton.com/  American Heart Association: www.heart.org Summary  The DASH eating plan is a healthy eating plan that has been shown to reduce high blood pressure (hypertension). It may also reduce your risk for type 2 diabetes, heart disease, and stroke.  With the DASH eating plan, you should limit salt (sodium) intake to 2,300 mg a day. If you have hypertension, you may need to reduce your sodium intake to 1,500 mg a day.  When on the DASH eating plan, aim to eat more fresh fruits  and vegetables, whole grains, lean proteins, low-fat dairy, and heart-healthy fats.  Work with your health care provider or diet and nutrition specialist (dietitian) to adjust your eating plan to your individual calorie needs. This information is not intended to replace advice given to you by your health care provider. Make sure you discuss any questions you have with your health care provider. Document Released: 06/23/2011 Document Revised: 06/27/2016 Document Reviewed:  06/27/2016 Elsevier Interactive Patient Education  [employer].
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
## 2018-06-08 ENCOUNTER — Other Ambulatory Visit: Payer: Self-pay | Admitting: Family Medicine

## 2018-06-08 MED ORDER — HYDROCODONE-ACETAMINOPHEN 10-325 MG PO TABS: 1.0000 | ORAL_TABLET | Freq: Four times a day (QID) | ORAL | 0 refills | Status: DC | PRN

## 2018-06-13 ENCOUNTER — Other Ambulatory Visit: Payer: Self-pay

## 2018-06-13 DIAGNOSIS — F410 Panic disorder [episodic paroxysmal anxiety] without agoraphobia: Secondary | ICD-10-CM

## 2018-06-13 DIAGNOSIS — F430 Acute stress reaction: Secondary | ICD-10-CM

## 2018-06-13 MED ORDER — LORAZEPAM 1 MG PO TABS: ORAL_TABLET | ORAL | 0 refills | Status: DC

## 2018-06-13 NOTE — Telephone Encounter (Signed)
Patient called office today requesting refill on his Lorazepam, patient states that he has enough to last until Monday morning but will be out after that. KW

## 2018-06-18 ENCOUNTER — Telehealth: Payer: Self-pay | Admitting: Family Medicine

## 2018-06-18 ENCOUNTER — Other Ambulatory Visit: Payer: Self-pay

## 2018-06-18 DIAGNOSIS — F430 Acute stress reaction: Secondary | ICD-10-CM

## 2018-06-18 DIAGNOSIS — F410 Panic disorder [episodic paroxysmal anxiety] without agoraphobia: Secondary | ICD-10-CM

## 2018-06-18 MED ORDER — LORAZEPAM 1 MG PO TABS: ORAL_TABLET | ORAL | 4 refills | Status: DC

## 2018-06-18 NOTE — Telephone Encounter (Signed)
Please let [facility] in [name] know that lorazepam can be dispensed today.

## 2018-06-18 NOTE — Telephone Encounter (Signed)
Pt reports he usually gets a 23 day supply (90 tablets) and it was only called in for one week. (28 tablets)  Pt states he has not picked up the prescription yet and [facility] [place]   Contact: [phone]  Thanks,   -Vernona RiegerLaura

## 2018-06-18 NOTE — Telephone Encounter (Signed)
Called [facility] they are able to fill the RX.  Pt advised.   Thanks,   -Vernona RiegerLaura

## 2018-06-18 NOTE — Telephone Encounter (Signed)
Pt is being told bu the pharmacy he cannot get his medication filled saying it's too soon to fill. Pt is out and states it's not too soon.  Please call pt back to let him know today what the issue is and if he can get it filled today.  Thanks, [employer]

## 2018-06-18 NOTE — Telephone Encounter (Signed)
Please review, is he too soon?  Thanks,   -Vernona RiegerLaura

## 2018-06-27 ENCOUNTER — Other Ambulatory Visit: Payer: Self-pay | Admitting: Family Medicine

## 2018-06-29 MED ORDER — HYDROCODONE-ACETAMINOPHEN 10-325 MG PO TABS: 1.0000 | ORAL_TABLET | Freq: Four times a day (QID) | ORAL | 0 refills | Status: DC | PRN

## 2018-07-03 ENCOUNTER — Other Ambulatory Visit: Payer: Self-pay | Admitting: Gastroenterology

## 2018-07-03 DIAGNOSIS — K219 Gastro-esophageal reflux disease without esophagitis: Secondary | ICD-10-CM

## 2018-07-15 ENCOUNTER — Other Ambulatory Visit: Payer: Self-pay | Admitting: Family Medicine

## 2018-07-16 MED ORDER — HYDROCODONE-ACETAMINOPHEN 10-325 MG PO TABS: 1.0000 | ORAL_TABLET | Freq: Four times a day (QID) | ORAL | 0 refills | Status: DC | PRN

## 2018-07-16 NOTE — Telephone Encounter (Signed)
Patient reports that pharmacy told him that he can not get his refill until Jan 2nd. Patient reports that he would need medication by tomorrow. He is requesting that we send in a RX for him to get the med filled tomorrow.

## 2018-07-16 NOTE — Telephone Encounter (Signed)
Patient's son is calling saying that the patient RX was actually picked up on 12/13, but the pharmacy told them they got it on 12/16. Because of this patient can not get his pain meds until the end of the week. He reports that the patient will run out of meds and will be in severe pain. He needs Dr. Sherrie MustacheFisher to please send in another refill. [facility].

## 2018-07-17 ENCOUNTER — Telehealth: Payer: Self-pay

## 2018-07-17 NOTE — Telephone Encounter (Signed)
FYI...  [name] called in complaining that the pharmacy will not fill his Hydrocodone today (07/17/2018) and states he will have to wait until 07/19/2018.  He says he picks this up every 20 days and he took his last pill this morning.  I advised pt that it has only been 18 days since the last refill and he should have enough to get him through until then.    Pt says he is going to "Have a terrible New Year's and that I'm  going to have withdrawal symptoms"  He says "I'm going to have to take more of the other 'stuff' to compensate for being out of the pain medication"
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
## 2018-07-24 ENCOUNTER — Ambulatory Visit (INDEPENDENT_AMBULATORY_CARE_PROVIDER_SITE_OTHER): Payer: Medicare Other | Admitting: Physician Assistant

## 2018-07-24 ENCOUNTER — Encounter: Payer: Self-pay | Admitting: Physician Assistant

## 2018-07-24 ENCOUNTER — Other Ambulatory Visit: Payer: Self-pay | Admitting: Family Medicine

## 2018-07-24 VITALS — BP 140/80 | HR 91 | Temp 98.7°F | Resp 16 | Wt 211.0 lb

## 2018-07-24 DIAGNOSIS — J40 Bronchitis, not specified as acute or chronic: Secondary | ICD-10-CM

## 2018-07-24 DIAGNOSIS — E782 Mixed hyperlipidemia: Secondary | ICD-10-CM | POA: Diagnosis not present

## 2018-07-24 MED ORDER — PREDNISONE 10 MG (21) PO TBPK: ORAL_TABLET | ORAL | 0 refills | Status: DC

## 2018-07-24 MED ORDER — AMOXICILLIN 875 MG PO TABS: 875.0000 mg | ORAL_TABLET | Freq: Two times a day (BID) | ORAL | 0 refills | Status: DC

## 2018-07-24 MED ORDER — PROMETHAZINE-DM 6.25-15 MG/5ML PO SYRP: 5.0000 mL | ORAL_SOLUTION | Freq: Two times a day (BID) | ORAL | 0 refills | Status: DC
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
## 2018-07-25 ENCOUNTER — Encounter: Payer: Self-pay | Admitting: Physician Assistant

## 2018-07-25 LAB — LIPID PANEL
Chol/HDL Ratio: 4.6 ratio (ref 0.0–5.0)
Cholesterol, Total: 174 mg/dL (ref 100–199)
HDL: 38 mg/dL — AB (ref >39)
LDL CALC: 92 mg/dL (ref 0–99)
Triglycerides: 221 mg/dL — ABNORMAL HIGH (ref 0–149)
VLDL CHOLESTEROL CAL: 44 mg/dL — AB (ref 5–40)

## 2018-08-06 ENCOUNTER — Other Ambulatory Visit: Payer: Self-pay | Admitting: Family Medicine

## 2018-08-06 MED ORDER — HYDROCODONE-ACETAMINOPHEN 10-325 MG PO TABS: 1.0000 | ORAL_TABLET | Freq: Four times a day (QID) | ORAL | 0 refills | Status: DC | PRN

## 2018-08-06 NOTE — Telephone Encounter (Signed)
Pt returned missed call. °Please call pt back if needed. ° °Thanks, °[facility] °

## 2018-08-07 ENCOUNTER — Telehealth: Payer: Self-pay

## 2018-08-07 NOTE — Telephone Encounter (Signed)
Copied from CRM [id number]. Topic: Referral - Status >> Aug 07, 2018  2:38 PM Ricarda Frame D wrote:  08/06/2018 Attempted to speak with patient but he was unable to hear with volume on max. Patient has hearing loss. Will route resources to [facility] to be mailed to patient. MA

## 2018-08-13 NOTE — Telephone Encounter (Signed)
Information placed in the mail to pt on 08/10/18. -MM

## 2018-08-22 ENCOUNTER — Other Ambulatory Visit: Payer: Self-pay | Admitting: Family Medicine

## 2018-08-24 MED ORDER — HYDROCODONE-ACETAMINOPHEN 10-325 MG PO TABS: 1.0000 | ORAL_TABLET | Freq: Four times a day (QID) | ORAL | 0 refills | Status: DC | PRN

## 2018-09-01 ENCOUNTER — Other Ambulatory Visit: Payer: Self-pay | Admitting: Family Medicine

## 2018-09-01 DIAGNOSIS — F32A Depression, unspecified: Secondary | ICD-10-CM

## 2018-09-01 DIAGNOSIS — F419 Anxiety disorder, unspecified: Secondary | ICD-10-CM

## 2018-09-01 DIAGNOSIS — F410 Panic disorder [episodic paroxysmal anxiety] without agoraphobia: Secondary | ICD-10-CM

## 2018-09-01 DIAGNOSIS — F329 Major depressive disorder, single episode, unspecified: Secondary | ICD-10-CM

## 2018-09-05 ENCOUNTER — Encounter: Payer: Self-pay | Admitting: Family Medicine

## 2018-09-05 ENCOUNTER — Ambulatory Visit (INDEPENDENT_AMBULATORY_CARE_PROVIDER_SITE_OTHER): Payer: Medicare Other | Admitting: Family Medicine

## 2018-09-05 VITALS — BP 158/85 | HR 116 | Temp 98.0°F | Wt 210.0 lb

## 2018-09-05 DIAGNOSIS — M5030 Other cervical disc degeneration, unspecified cervical region: Secondary | ICD-10-CM | POA: Diagnosis not present

## 2018-09-05 DIAGNOSIS — I10 Essential (primary) hypertension: Secondary | ICD-10-CM

## 2018-09-05 DIAGNOSIS — F410 Panic disorder [episodic paroxysmal anxiety] without agoraphobia: Secondary | ICD-10-CM | POA: Diagnosis not present

## 2018-09-05 DIAGNOSIS — G8929 Other chronic pain: Secondary | ICD-10-CM

## 2018-09-05 DIAGNOSIS — M549 Dorsalgia, unspecified: Secondary | ICD-10-CM

## 2018-09-05 DIAGNOSIS — F419 Anxiety disorder, unspecified: Secondary | ICD-10-CM

## 2018-09-05 DIAGNOSIS — F329 Major depressive disorder, single episode, unspecified: Secondary | ICD-10-CM | POA: Diagnosis not present

## 2018-09-05 DIAGNOSIS — F32A Depression, unspecified: Secondary | ICD-10-CM

## 2018-09-05 DIAGNOSIS — K219 Gastro-esophageal reflux disease without esophagitis: Secondary | ICD-10-CM

## 2018-09-05 MED ORDER — AMLODIPINE BESYLATE 5 MG PO TABS: 5.0000 mg | ORAL_TABLET | Freq: Every evening | ORAL | 3 refills | Status: DC

## 2018-09-05 MED ORDER — OMEPRAZOLE 40 MG PO CPDR: 40.0000 mg | DELAYED_RELEASE_CAPSULE | Freq: Every day | ORAL | 4 refills | Status: DC
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
## 2018-09-05 NOTE — Patient Instructions (Addendum)
.   Please review the attached list of medications and notify my office if there are any errors.   . Please bring all of your medications to every appointment so we can make sure that our medication list is the same as yours.    Call [facility] at  [phone] if you don't get a call from them within the next 2 weeks   I recommend you get a medical alert device in order to call for help in case of emergencies. MedAlert devices are available from [facility]. Call [phone] for information on how to order this device.

## 2018-09-10 ENCOUNTER — Other Ambulatory Visit: Payer: Self-pay | Admitting: Family Medicine

## 2018-09-10 MED ORDER — HYDROCODONE-ACETAMINOPHEN 10-325 MG PO TABS: 1.0000 | ORAL_TABLET | Freq: Four times a day (QID) | ORAL | 0 refills | Status: DC | PRN

## 2018-09-10 NOTE — Telephone Encounter (Signed)
Pt needing a refill on:  HYDROcodone-acetaminophen (NORCO) 10-325 MG tablet  Please fill at:  [id number] - [place], [place] - [address] AT [facility] OF [address] [phone] (Phone) [phone] (Fax)    Thanks, [employer]

## 2018-09-11 ENCOUNTER — Other Ambulatory Visit: Payer: Self-pay | Admitting: Family Medicine

## 2018-09-11 NOTE — Telephone Encounter (Signed)
Already done

## 2018-09-11 NOTE — Telephone Encounter (Signed)
Tried calling patient. Left message of voice message machine that prescription has been sent into the pharmacy (ok per DPR).

## 2018-09-11 NOTE — Telephone Encounter (Signed)
Pt stating he cannot make the 20 supply of HYDROcodone-acetaminophen (NORCO) 10-325 MG tablet last.  He is out and in severe pain and it is a few days early.  He is asking for the refill to be sent in for today if possible.  Please let pt know if this can be done.  Thanks, [employer]

## 2018-09-12 ENCOUNTER — Telehealth: Payer: Self-pay | Admitting: Family Medicine

## 2018-09-12 NOTE — Telephone Encounter (Signed)
[facility] refused referral for this patient. Can he get referral to [facility] or [facility]?

## 2018-09-14 NOTE — Telephone Encounter (Signed)
Spoke with patient to advise that [facility] declined referral.I gave him information for [facility] and [facility]. Walk in clinics only for 1st time appointment

## 2018-09-25 ENCOUNTER — Other Ambulatory Visit: Payer: Self-pay | Admitting: Family Medicine

## 2018-09-25 NOTE — Telephone Encounter (Signed)
Pt needs a refill on   Hydrocodone [facility]) 10-325  He said to make sure it is the [facility] .  He said last time the pill said hydrocodone but did not say [facility]  He said he will need these by Friday  [place]  CB#  [phone]  Thanks Barth Kirks

## 2018-09-26 NOTE — Telephone Encounter (Signed)
20 days written on 09/10/2018. Next refill 09/30/2018 (Sunday)

## 2018-09-28 ENCOUNTER — Other Ambulatory Visit: Payer: Self-pay | Admitting: Family Medicine

## 2018-09-28 MED ORDER — HYDROCODONE-ACETAMINOPHEN 10-325 MG PO TABS: 1.0000 | ORAL_TABLET | Freq: Four times a day (QID) | ORAL | 0 refills | Status: DC | PRN

## 2018-10-02 ENCOUNTER — Telehealth: Payer: Self-pay | Admitting: Family Medicine

## 2018-10-02 DIAGNOSIS — F430 Acute stress reaction: Secondary | ICD-10-CM

## 2018-10-02 DIAGNOSIS — F410 Panic disorder [episodic paroxysmal anxiety] without agoraphobia: Secondary | ICD-10-CM

## 2018-10-02 NOTE — Telephone Encounter (Signed)
Pt's son calling for pt wanting to know if he can go ahead and have his LORazepam (ATIVAN) 1 MG tablet filled due to all the shut downs and Coronavirus?  Please advise.  Thanks, [employer]

## 2018-10-03 MED ORDER — LORAZEPAM 1 MG PO TABS: ORAL_TABLET | ORAL | 4 refills | Status: DC

## 2018-10-03 NOTE — Telephone Encounter (Signed)
Pt calling back to check.  Pt has only 2 left and needing a refill before the weekend.  Thanks, [employer]

## 2018-10-03 NOTE — Telephone Encounter (Signed)
Pt advised.   Thanks,   -Bud Kaeser  

## 2018-10-03 NOTE — Telephone Encounter (Signed)
Please advise patient that it is 5 days early. We can call pharmacy tomorrow and approve it to be filled then (March 19th). He is not to take more than 4 in a day and next refill will not be approved until April 11th, 2020.

## 2018-10-03 NOTE — Telephone Encounter (Signed)
Pharmacy is needing approval for Rx to be filled early.  Please call pharmacy: [facility] #[id number] - [place], [place] - [address] AT [facility] OF [address] [phone] (Phone) [phone] (Fax)   Thanks, [facility]  Please call pt let him know this was done.

## 2018-10-03 NOTE — Telephone Encounter (Signed)
Please advise [facility] may dispense lorazepam Thursday March 19th, thanks.

## 2018-10-04 ENCOUNTER — Telehealth: Payer: Self-pay | Admitting: Family Medicine

## 2018-10-04 NOTE — Telephone Encounter (Signed)
Pharmacist advised.   Thanks,   -Zelia Yzaguirre   

## 2018-10-04 NOTE — Telephone Encounter (Signed)
Mr Dylan [name] called saying the pharmacy told him they could not dispense until the 23 rd.  Can we please call the pharmacy and let them know it is ok for him to get the medication today  Please call pt and let him know it has been taken care of  [phone]  Thanks teri

## 2018-10-10 ENCOUNTER — Telehealth: Payer: Self-pay

## 2018-10-10 DIAGNOSIS — F419 Anxiety disorder, unspecified: Secondary | ICD-10-CM

## 2018-10-10 MED ORDER — VENLAFAXINE HCL ER 75 MG PO CP24: 75.0000 mg | ORAL_CAPSULE | Freq: Every day | ORAL | 1 refills | Status: DC

## 2018-10-10 NOTE — Telephone Encounter (Signed)
Can add venlafaxine to current medications. Needs referral to counselor, have sent prescription and referral notice.

## 2018-10-10 NOTE — Telephone Encounter (Signed)
Patient called stating his anxiety level is very high and he is asking for help.  States that it is every day all day.  He takes Lorazepam QID but he is still waking during the night.  Patient would like call back.  CB [phone] [facility] [place]

## 2018-10-10 NOTE — Telephone Encounter (Signed)
Patient advised as below.  

## 2018-10-10 NOTE — Telephone Encounter (Signed)
Tried calling patient. Left message to call back. 

## 2018-10-18 ENCOUNTER — Other Ambulatory Visit: Payer: Self-pay | Admitting: Family Medicine

## 2018-10-18 NOTE — Telephone Encounter (Signed)
Last dispensed 20 day supply on 10/01/2018

## 2018-10-19 MED ORDER — HYDROCODONE-ACETAMINOPHEN 10-325 MG PO TABS: 1.0000 | ORAL_TABLET | Freq: Four times a day (QID) | ORAL | 0 refills | Status: DC | PRN

## 2018-11-04 ENCOUNTER — Other Ambulatory Visit: Payer: Self-pay | Admitting: Family Medicine

## 2018-11-05 NOTE — Telephone Encounter (Signed)
Please Review

## 2018-11-06 ENCOUNTER — Telehealth: Payer: Self-pay

## 2018-11-06 ENCOUNTER — Other Ambulatory Visit: Payer: Self-pay

## 2018-11-06 NOTE — Telephone Encounter (Signed)
Patient called office to scheduled appt for TD vaccine, I reviewed chart and patient is due for this effective 08/2018. I dont see the urgent need for patient to come in office now to update this shot. I informed patient that I will have nurse review chart but feel that during this time he can wait to come in office to get vaccine. Please advise.KW

## 2018-11-06 NOTE — Telephone Encounter (Signed)
Is not due to be refilled until 11-08-2018

## 2018-11-06 NOTE — Telephone Encounter (Signed)
20 days dispensed 10-19-2018 which was 2 days early

## 2018-11-07 NOTE — Telephone Encounter (Signed)
Patient is requesting medication be filled today, I explained to him that is not due yet to be filled.

## 2018-11-08 MED ORDER — HYDROCODONE-ACETAMINOPHEN 10-325 MG PO TABS: 1.0000 | ORAL_TABLET | Freq: Four times a day (QID) | ORAL | 0 refills | Status: DC | PRN

## 2018-11-24 ENCOUNTER — Other Ambulatory Visit: Payer: Self-pay | Admitting: Family Medicine

## 2018-11-26 NOTE — Telephone Encounter (Signed)
Please review for Dr. Fisher.   Thanks,   -Laura  

## 2018-11-27 MED ORDER — HYDROCODONE-ACETAMINOPHEN 10-325 MG PO TABS: 1.0000 | ORAL_TABLET | Freq: Four times a day (QID) | ORAL | 0 refills | Status: DC | PRN

## 2018-12-03 ENCOUNTER — Other Ambulatory Visit: Payer: Self-pay | Admitting: Family Medicine

## 2018-12-14 ENCOUNTER — Other Ambulatory Visit: Payer: Self-pay | Admitting: Family Medicine

## 2018-12-14 NOTE — Telephone Encounter (Signed)
20 day supply dispensed 11-28-2018

## 2018-12-15 MED ORDER — HYDROCODONE-ACETAMINOPHEN 10-325 MG PO TABS: 1.0000 | ORAL_TABLET | Freq: Four times a day (QID) | ORAL | 0 refills | Status: DC | PRN

## 2018-12-21 ENCOUNTER — Ambulatory Visit: Payer: Medicare Other | Admitting: Family Medicine

## 2018-12-25 ENCOUNTER — Other Ambulatory Visit: Payer: Self-pay

## 2018-12-25 ENCOUNTER — Ambulatory Visit (INDEPENDENT_AMBULATORY_CARE_PROVIDER_SITE_OTHER): Payer: Medicare Other | Admitting: Family Medicine

## 2018-12-25 ENCOUNTER — Encounter: Payer: Self-pay | Admitting: Family Medicine

## 2018-12-25 VITALS — BP 152/98 | HR 96 | Temp 98.2°F | Resp 20 | Wt 216.0 lb

## 2018-12-25 DIAGNOSIS — G8929 Other chronic pain: Secondary | ICD-10-CM | POA: Diagnosis not present

## 2018-12-25 DIAGNOSIS — F1190 Opioid use, unspecified, uncomplicated: Secondary | ICD-10-CM

## 2018-12-25 DIAGNOSIS — I10 Essential (primary) hypertension: Secondary | ICD-10-CM

## 2018-12-25 DIAGNOSIS — Z5181 Encounter for therapeutic drug level monitoring: Secondary | ICD-10-CM | POA: Diagnosis not present

## 2018-12-25 DIAGNOSIS — F419 Anxiety disorder, unspecified: Secondary | ICD-10-CM

## 2018-12-25 MED ORDER — AMLODIPINE BESYLATE 5 MG PO TABS: 2.5000 mg | ORAL_TABLET | Freq: Every evening | ORAL | 3 refills | Status: DC

## 2018-12-25 NOTE — Progress Notes (Signed)
Patient: Dylan [name] Male    DOB: Dec 05, 1955   63 y.o.   MRN: [id number] Visit Date: 12/25/2018  Today's Provider: Lelon Huh, MD   Chief Complaint  Patient presents with  . Hypertension  . Anxiety   Subjective:     Anxiety  Presents for follow-up (Pt started Venlafaxine and was referred to counselor/psychologist.) visit. Symptoms include depressed mood, excessive worry, insomnia, nervous/anxious behavior and panic. Patient reports no chest pain, decreased concentration, dizziness, nausea or shortness of breath.    States that venlafaxine is working much better than escitalopram that he was previously taking. Has been able to cut back on lorazepam to twice a day.    Hypertension, follow-up:  BP Readings from Last 3 Encounters:  12/25/18 (!) 152/98  09/05/18 (!) 158/85  07/24/18 140/80    He was last seen for hypertension 4 months ago.  BP at that visit was 158/85. Management since that visit includes adding amlodipine 5mg  a day. He reports poor compliance with treatment.  Pt reports he stopped taking amlodipine about a week after starting secondary to having side effects. He is having side effects.  He is exercising. He is adherent to low salt diet.   Outside blood pressures are not being checked. He is experiencing chest pain.  Patient denies chest pain, chest pressure/discomfort, dyspnea and lower extremity edema.   Cardiovascular risk factors include advanced age (older than 31 for men, 16 for women), hypertension and male gender.  Use of agents associated with hypertension: none.     Weight trend: stable Wt Readings from Last 3 Encounters:  12/25/18 216 lb (98 kg)  09/05/18 210 lb (95.3 kg)  07/24/18 211 lb (95.7 kg)    Current diet: in general, a "healthy" diet    ------------------------------------------------------------------------    Allergies  Allergen Reactions  . Morphine And Related Hives    Dry heaves and hives  . Belsomra   [Suvorexant]     Hallucinations  . Duloxetine     Nervous  . Meloxicam     Stomach cramps  . Morphine Other (See Comments)  . Other     CBD oil - felt anxious     Current Outpatient Medications:  .  aspirin 81 MG tablet, Take 81 mg by mouth daily. Reported on 01/01/2016, Disp: , Rfl:  .  cyclobenzaprine (FLEXERIL) 10 MG tablet, TAKE 1 TABLET BY MOUTH 3  TIMES DAILY AS NEEDED FOR  MUSCLE SPASM(S), Disp: 90 tablet, Rfl: 4 .  diphenhydramine-acetaminophen (TYLENOL PM) 25-500 MG TABS, Take 1 tablet by mouth at bedtime as needed (pain). Reported on 01/01/2016, Disp: , Rfl:  .  fluticasone (FLONASE) 50 MCG/ACT nasal spray, fluticasone propionate 50 mcg/actuation nasal spray,suspension, Disp: , Rfl:  .  HYDROcodone-acetaminophen (NORCO) 10-325 MG tablet, Take 1-2 tablets by mouth every 6 (six) hours as needed for severe pain., Disp: 160 tablet, Rfl: 0 .  LORazepam (ATIVAN) 1 MG tablet, TAKE 1 TABLET BY MOUTH UP TO FOUR TIMES DAILY IF NEEDED FOR ANXIETY, Disp: 90 tablet, Rfl: 4 .  Melatonin 3 MG CAPS, Take 3 mg by mouth as needed. , Disp: , Rfl:  .  naproxen sodium (ALEVE) 220 MG tablet, Take 220 mg by mouth daily as needed., Disp: , Rfl:  .  omeprazole (PRILOSEC) 40 MG capsule, Take 1 capsule (40 mg total) by mouth daily., Disp: 90 capsule, Rfl: 4 .  simvastatin (ZOCOR) 40 MG tablet, Take 1 tablet (40 mg total) by mouth daily., Disp:  90 tablet, Rfl: 4 .  TESTOSTERONE NA, Take 3 tablets by mouth daily. Nugenix, Disp: , Rfl:  .  venlafaxine XR (EFFEXOR-XR) 75 MG 24 hr capsule, TAKE 1 CAPSULE(75 MG) BY MOUTH DAILY WITH BREAKFAST, Disp: 30 capsule, Rfl: 1 .  amLODipine (NORVASC) 5 MG tablet, Take 1 tablet (5 mg total) by mouth every evening. (Patient not taking: Reported on 12/25/2018), Disp: 90 tablet, Rfl: 3 .  escitalopram (LEXAPRO) 20 MG tablet, TAKE 1 TABLET BY MOUTH  DAILY (Patient not taking: Reported on 12/25/2018), Disp: 90 tablet, Rfl: 4 .  OVER THE COUNTER MEDICATION, CEREBRA 2 TABLETS DAILY,  Disp: , Rfl:   Review of Systems  Constitutional: Positive for fatigue. Negative for activity change, appetite change, chills, diaphoresis, fever and unexpected weight change.  Respiratory: Negative.  Negative for shortness of breath.   Cardiovascular: Negative.  Negative for chest pain.  Gastrointestinal: Positive for constipation and diarrhea. Negative for abdominal distention, abdominal pain, anal bleeding, blood in stool, nausea, rectal pain and vomiting.  Neurological: Negative for dizziness, light-headedness and headaches.  Psychiatric/Behavioral: Negative for decreased concentration. The patient is nervous/anxious and has insomnia.     Social History   Tobacco Use  . Smoking status: Never Smoker  . Smokeless tobacco: Never Used  Substance Use Topics  . Alcohol use: Yes    Comment: occasionally 1-2 beers a month      Objective:   BP (!) 152/98 (BP Location: Left Arm, Patient Position: Sitting, Cuff Size: Large)   Pulse 96   Temp 98.2 F (36.8 C) (Oral)   Resp 20   Wt 216 lb (98 kg)   BMI 30.99 kg/m  Vitals:   12/25/18 1343  BP: (!) 152/98  Pulse: 96  Resp: 20  Temp: 98.2 F (36.8 C)  TempSrc: Oral  Weight: 216 lb (98 kg)     Physical Exam   General Appearance:    Alert, cooperative, no distress  Eyes:    PERRL, conjunctiva/corneas clear, EOM's intact       Lungs:     Clear to auscultation bilaterally, respirations unlabored  Heart:    Regular rate and rhythm  Neurologic:   Awake, alert, oriented x 3. No apparent focal neurological           defect.           Assessment & Plan    1. Anxiety Much better since starting venlafaxine. Continue current medications.  Try to continue to limit lorazepam to no more than 2 daily.   2. Essential (primary) hypertension Did not tolerate 5mg  amlodipine. He has an whole prescription bottle left, will resume at 1/2 tablet daily.  - amLODipine (NORVASC) 5 MG tablet; Take 0.5 tablets (2.5 mg total) by mouth every  evening.  Dispense: 90 tablet; Refill: 3  3. Opiate use  - Pain Mgt Scrn (14 Drugs), Ur     Mila Merryonald Koren Plyler, MD  [facility]

## 2018-12-25 NOTE — Patient Instructions (Addendum)
.   Please review the attached list of medications and notify my office if there are any errors.   . Please bring all of your medications to every appointment so we can make sure that our medication list is the same as yours.    Start back on amlodipine at 1/2 tablet every evening for a week or two, then try increasing to 1 full tablet every evening.   Marland Kitchen .You are due for a Tdap (tetanus-diptheria-pertussis vaccine) which protects you from tetanus and whooping cough. Please check with your insurance plan or pharmacy regarding coverage for this vaccine.

## 2018-12-26 LAB — PAIN MGT SCRN (14 DRUGS), UR
Amphetamine Scrn, Ur: NEGATIVE ng/mL
BARBITURATE SCREEN URINE: NEGATIVE ng/mL
BENZODIAZEPINE SCREEN, URINE: NEGATIVE ng/mL
Buprenorphine, Urine: NEGATIVE ng/mL
CANNABINOIDS UR QL SCN: NEGATIVE ng/mL
Cocaine (Metab) Scrn, Ur: NEGATIVE ng/mL
Creatinine(Crt), U: 42.7 mg/dL (ref 20.0–300.0)
Fentanyl, Urine: NEGATIVE pg/mL
Meperidine Screen, Urine: NEGATIVE ng/mL
Methadone Screen, Urine: NEGATIVE ng/mL
OXYCODONE+OXYMORPHONE UR QL SCN: NEGATIVE ng/mL
Opiate Scrn, Ur: POSITIVE ng/mL — AB
Ph of Urine: 5.8 (ref 4.5–8.9)
Phencyclidine Qn, Ur: NEGATIVE ng/mL
Propoxyphene Scrn, Ur: NEGATIVE ng/mL
Tramadol Screen, Urine: NEGATIVE ng/mL

## 2019-01-02 ENCOUNTER — Other Ambulatory Visit: Payer: Self-pay | Admitting: Family Medicine

## 2019-01-03 NOTE — Telephone Encounter (Signed)
20 day supply dispensed early on 12-16-2018.  Next dispense due 01-06-2019

## 2019-01-04 MED ORDER — HYDROCODONE-ACETAMINOPHEN 10-325 MG PO TABS: 1.0000 | ORAL_TABLET | Freq: Four times a day (QID) | ORAL | 0 refills | Status: DC | PRN

## 2019-01-04 NOTE — Telephone Encounter (Signed)
Pt's son Dylan [name] called after hours line while office was closed for lunch. Fax noted that Dylan [name] was calling about pt needing his HYDROcodone-acetaminophen ([place]) 10-325 MG tablet approved. No other details were included in fax from after hours line. Please advise. Thanks TNP

## 2019-01-04 NOTE — Telephone Encounter (Signed)
Pt's brother called saying the prescription is due tomorrow for the hydrocodone.  He is having anxiety about it being not approved until Sunday  CB#  [phone] or [phone]  Thanks teri

## 2019-01-17 ENCOUNTER — Telehealth: Payer: Self-pay | Admitting: Family Medicine

## 2019-01-17 NOTE — Chronic Care Management (AMB) (Signed)
Chronic Care Management   Note  01/17/2019 Name: Dylan [name] MRN: [id number] DOB: 06/02/1956  Dylan [name] is a 63 y.o. year old male who is a primary care patient of Caryn Section, Kirstie Peri, MD. I reached out to Dylan [name] by phone today in response to a referral sent by Dylan [name].    Dylan [name] information about Chronic Care Management services today including:  1. CCM service includes personalized support from designated clinical staff supervised by his physician, including individualized [name] of care and coordination with other care providers 2. 24/7 contact phone numbers for assistance for urgent and routine care needs. 3. Service will only be billed when office clinical staff spend 20 minutes or more in a month to coordinate care. 4. Only one practitioner may furnish and bill the service in a calendar month. 5. The patient may stop CCM services at any time (effective at the end of the month) by phone call to the office staff. 6. The patient will be responsible for cost sharing (co-pay) of up to 20% of the service fee (after annual deductible is met).  Patient did not agree to enrollment in care management services and does not wish to consider at this time.  Follow up [name]: The patient has been provided with contact information for the chronic care management team and has been advised to call with any health related questions or concerns.   [place]  [email or web address]   ??[phone]

## 2019-01-21 ENCOUNTER — Other Ambulatory Visit: Payer: Self-pay | Admitting: Family Medicine

## 2019-01-22 ENCOUNTER — Other Ambulatory Visit: Payer: Self-pay | Admitting: Family Medicine

## 2019-01-22 DIAGNOSIS — F430 Acute stress reaction: Secondary | ICD-10-CM

## 2019-01-22 DIAGNOSIS — F410 Panic disorder [episodic paroxysmal anxiety] without agoraphobia: Secondary | ICD-10-CM

## 2019-01-22 MED ORDER — HYDROCODONE-ACETAMINOPHEN 10-325 MG PO TABS: 1.0000 | ORAL_TABLET | Freq: Four times a day (QID) | ORAL | 0 refills | Status: DC | PRN

## 2019-01-25 ENCOUNTER — Telehealth: Payer: Self-pay | Admitting: Family Medicine

## 2019-01-25 MED ORDER — TADALAFIL 20 MG PO TABS: 10.0000 mg | ORAL_TABLET | ORAL | 11 refills | Status: DC | PRN

## 2019-01-25 NOTE — Telephone Encounter (Signed)
Patient advised as directed below. 

## 2019-01-25 NOTE — Telephone Encounter (Signed)
Pt needing a refill of Viagra.   He was prescribed this a while back and stopped using it.  Now needing some since he's met someone.   Pt is wondering if Cialis may be cheaper and ok for him to use?   Please call into:  [id number] - [address], [place] [phone] (Phone) [phone] (Fax)   Thanks, [employer]

## 2019-01-25 NOTE — Telephone Encounter (Signed)
Sent prescription Cialis to [facility]. He can use goodrx app on his phone to get discount.

## 2019-01-25 NOTE — Telephone Encounter (Signed)
LMTCB 01/25/2019   Thanks,   -Laura  

## 2019-02-05 ENCOUNTER — Other Ambulatory Visit: Payer: Self-pay | Admitting: Family Medicine

## 2019-02-06 ENCOUNTER — Other Ambulatory Visit: Payer: Self-pay | Admitting: Family Medicine

## 2019-02-07 NOTE — Telephone Encounter (Signed)
last dispensed 20 day supply 3 days early on 01/22/2019

## 2019-02-09 ENCOUNTER — Other Ambulatory Visit: Payer: Self-pay | Admitting: Family Medicine

## 2019-02-10 MED ORDER — HYDROCODONE-ACETAMINOPHEN 10-325 MG PO TABS: 1.0000 | ORAL_TABLET | Freq: Four times a day (QID) | ORAL | 0 refills | Status: DC | PRN

## 2019-02-11 ENCOUNTER — Telehealth: Payer: Self-pay | Admitting: Family Medicine

## 2019-02-11 NOTE — Telephone Encounter (Signed)
He either needs to count pills before leaving pharmacy next time, or change to a different pharmacy.

## 2019-02-11 NOTE — Telephone Encounter (Signed)
Tried calling patient. Left message to call back. 

## 2019-02-11 NOTE — Telephone Encounter (Signed)
Hope we have our staff back this week.

## 2019-02-11 NOTE — Telephone Encounter (Signed)
Pt stated he picked up his HYDROcodone-acetaminophen ([place]) 10-325 MG tablet from pharmacy and when he got home he counted the pills and he had 149 not 160. Pt stated he contacted pharmacy and they advised that there wasn't anything they could do. Pt wanted it noted so if he has to ask for his medication early we know that he was short 11 pills. Please advise. Thanks TNP

## 2019-02-12 NOTE — Telephone Encounter (Signed)
Pt returned missed call.  Please call pt back. ° °Thanks, °TGH °

## 2019-02-12 NOTE — Telephone Encounter (Signed)
Pt advised.   Thanks,   -Emidio Warrell  

## 2019-02-28 ENCOUNTER — Other Ambulatory Visit: Payer: Self-pay | Admitting: Family Medicine

## 2019-03-01 ENCOUNTER — Other Ambulatory Visit: Payer: Self-pay | Admitting: Family Medicine

## 2019-03-01 DIAGNOSIS — F430 Acute stress reaction: Secondary | ICD-10-CM

## 2019-03-01 DIAGNOSIS — F410 Panic disorder [episodic paroxysmal anxiety] without agoraphobia: Secondary | ICD-10-CM

## 2019-03-01 NOTE — Telephone Encounter (Signed)
Pt needing refills on:  HYDROcodone-acetaminophen (NORCO) 10-325 MG tablet - due Sunday for refill  LORazepam (ATIVAN) 1 MG tablet   Please fill at: [facility], [place] [phone] (Phone) [phone] (Fax)   Thanks, [employer]

## 2019-03-02 MED ORDER — HYDROCODONE-ACETAMINOPHEN 10-325 MG PO TABS: 1.0000 | ORAL_TABLET | Freq: Four times a day (QID) | ORAL | 0 refills | Status: DC | PRN

## 2019-03-06 ENCOUNTER — Other Ambulatory Visit: Payer: Self-pay | Admitting: Family Medicine

## 2019-03-06 DIAGNOSIS — F410 Panic disorder [episodic paroxysmal anxiety] without agoraphobia: Secondary | ICD-10-CM

## 2019-03-06 DIAGNOSIS — F430 Acute stress reaction: Secondary | ICD-10-CM

## 2019-03-06 MED ORDER — LORAZEPAM 1 MG PO TABS: ORAL_TABLET | ORAL | 1 refills | Status: DC

## 2019-03-06 NOTE — Telephone Encounter (Signed)
Pt needing a refill on: °LORazepam (ATIVAN) 1 MG tablet ° °Please fill at: ° ° °[id number] - [place], [place] - [address] [phone] (Phone) °[phone] (Fax)  ° °Thanks, °TGH °

## 2019-03-13 ENCOUNTER — Ambulatory Visit (INDEPENDENT_AMBULATORY_CARE_PROVIDER_SITE_OTHER): Payer: Medicare Other | Admitting: Family Medicine

## 2019-03-13 ENCOUNTER — Encounter: Payer: Self-pay | Admitting: Family Medicine

## 2019-03-13 ENCOUNTER — Other Ambulatory Visit: Payer: Self-pay

## 2019-03-13 VITALS — BP 134/85 | HR 109 | Temp 97.3°F | Wt 206.0 lb

## 2019-03-13 DIAGNOSIS — Z23 Encounter for immunization: Secondary | ICD-10-CM | POA: Diagnosis not present

## 2019-03-13 DIAGNOSIS — I10 Essential (primary) hypertension: Secondary | ICD-10-CM | POA: Diagnosis not present

## 2019-03-13 DIAGNOSIS — F419 Anxiety disorder, unspecified: Secondary | ICD-10-CM

## 2019-03-13 DIAGNOSIS — F329 Major depressive disorder, single episode, unspecified: Secondary | ICD-10-CM | POA: Diagnosis not present

## 2019-03-13 DIAGNOSIS — F32A Depression, unspecified: Secondary | ICD-10-CM

## 2019-03-13 DIAGNOSIS — N529 Male erectile dysfunction, unspecified: Secondary | ICD-10-CM

## 2019-03-13 MED ORDER — VENLAFAXINE HCL ER 150 MG PO CP24: 150.0000 mg | ORAL_CAPSULE | ORAL | 5 refills | Status: DC

## 2019-03-13 MED ORDER — AMLODIPINE BESYLATE 5 MG PO TABS: 5.0000 mg | ORAL_TABLET | Freq: Every evening | ORAL | Status: DC

## 2019-03-13 MED ORDER — TADALAFIL 20 MG PO TABS: 10.0000 mg | ORAL_TABLET | ORAL | 1 refills | Status: DC | PRN

## 2019-03-13 NOTE — Patient Instructions (Addendum)
.   Please review the attached list of medications and notify my office if there are any errors.   . Please bring all of your medications to every appointment so we can make sure that our medication list is the same as yours.   . You can install the GoodRx app on your smart phone to find the lowest prices for generic medications.   

## 2019-03-13 NOTE — Progress Notes (Signed)
Patient: Dylan [name] Male    DOB: Oct 13, 1955   63 y.o.   MRN: [id number] Visit Date: 03/13/2019  Today's Provider: Lelon Huh, MD   Chief Complaint  Patient presents with  . Hypertension  . Anxiety    worsening   Subjective:     Anxiety Presents for follow-up visit. Symptoms include decreased concentration, depressed mood, excessive worry, insomnia, nervous/anxious behavior, panic and shortness of breath. Patient reports no nausea. The quality of sleep is poor.        Hypertension, follow-up:  BP Readings from Last 3 Encounters:  12/25/18 (!) 152/98  09/05/18 (!) 158/85  07/24/18 140/80    He was last seen for hypertension 3 months ago.  BP at that visit was 152/98. Management since that visit includes restarting Amlodipine 5mg  1/2 daily.  Pt reports he has been taking 1 full tablet a day (5mg ).  He reports excellent compliance with treatment. He is not having side effects.  Pt reports he was getting dizzy with Amlodipine but since he started taking it at night he states the dizziness has resolved.  He is exercising. He is not adherent to low salt diet.   Outside blood pressures are 150/100's.  Use of agents associated with hypertension: none.     Weight trend: stable Wt Readings from Last 3 Encounters:  12/25/18 216 lb (98 kg)  09/05/18 210 lb (95.3 kg)  07/24/18 211 lb (95.7 kg)    Current diet: in general, a "healthy" diet    ------------------------------------------------------------------------  He also complains of ED. Has been prescribed Cialis but he never filled due to expense of medication.   Allergies  Allergen Reactions  . Morphine And Related Hives    Dry heaves and hives  . Belsomra  [Suvorexant]     Hallucinations  . Duloxetine     Nervous  . Meloxicam     Stomach cramps  . Morphine Other (See Comments)  . Other     CBD oil - felt anxious     Current Outpatient Medications:  .  amLODipine (NORVASC) 5 MG tablet, Take  0.5 tablets (2.5 mg total) by mouth every evening. (Patient taking differently: Take 5 mg by mouth every evening. ), Disp: 90 tablet, Rfl: 3 .  aspirin 81 MG tablet, Take 81 mg by mouth daily. Reported on 01/01/2016, Disp: , Rfl:  .  cyclobenzaprine (FLEXERIL) 10 MG tablet, TAKE 1 TABLET BY MOUTH 3  TIMES DAILY AS NEEDED FOR  MUSCLE SPASM(S), Disp: 90 tablet, Rfl: 4 .  diphenhydramine-acetaminophen (TYLENOL PM) 25-500 MG TABS, Take 1 tablet by mouth at bedtime as needed (pain). Reported on 01/01/2016, Disp: , Rfl:  .  fluticasone (FLONASE) 50 MCG/ACT nasal spray, fluticasone propionate 50 mcg/actuation nasal spray,suspension, Disp: , Rfl:  .  HYDROcodone-acetaminophen (NORCO) 10-325 MG tablet, Take 1-2 tablets by mouth every 6 (six) hours as needed for severe pain., Disp: 160 tablet, Rfl: 0 .  LORazepam (ATIVAN) 1 MG tablet, TAKE 1 TABLET BY MOUTH UP TO FOUR TIMES DAILY AS NEEDED FOR ANXIETY, Disp: 90 tablet, Rfl: 1 .  Melatonin 3 MG CAPS, Take 3 mg by mouth as needed. , Disp: , Rfl:  .  naproxen sodium (ALEVE) 220 MG tablet, Take 220 mg by mouth daily as needed., Disp: , Rfl:  .  omeprazole (PRILOSEC) 40 MG capsule, Take 1 capsule (40 mg total) by mouth daily., Disp: 90 capsule, Rfl: 4 .  OVER THE COUNTER MEDICATION, CEREBRA 2 TABLETS DAILY, Disp: ,  Rfl:  .  simvastatin (ZOCOR) 40 MG tablet, Take 1 tablet (40 mg total) by mouth daily., Disp: 90 tablet, Rfl: 4 .  TESTOSTERONE NA, Take 3 tablets by mouth daily. Nugenix, Disp: , Rfl:  .  venlafaxine XR (EFFEXOR-XR) 75 MG 24 hr capsule, TAKE 1 CAPSULE(75 MG) BY MOUTH DAILY WITH BREAKFAST, Disp: 30 capsule, Rfl: 12 .  tadalafil (CIALIS) 20 MG tablet, Take 0.5-1 tablets (10-20 mg total) by mouth every other day as needed for erectile dysfunction. (Patient not taking: Reported on 03/13/2019), Disp: 8 tablet, Rfl: 11  Review of Systems  Constitutional: Positive for diaphoresis, fatigue and unexpected weight change. Negative for activity change, appetite  change, chills and fever.  Respiratory: Positive for shortness of breath. Negative for apnea, cough, choking, chest tightness, wheezing and stridor.   Cardiovascular: Negative.   Gastrointestinal: Positive for abdominal pain, diarrhea and vomiting. Negative for abdominal distention, anal bleeding, blood in stool, constipation, nausea and rectal pain.  Endocrine: Positive for heat intolerance. Negative for cold intolerance.  Psychiatric/Behavioral: Positive for decreased concentration and sleep disturbance. The patient is nervous/anxious and has insomnia.     Social History   Tobacco Use  . Smoking status: Never Smoker  . Smokeless tobacco: Never Used  Substance Use Topics  . Alcohol use: Yes    Comment: occasionally 1-2 beers a month      Objective:   BP 134/85 (BP Location: Right Arm, Patient Position: Sitting, Cuff Size: Large)   Pulse (!) 109   Temp (!) 97.3 F (36.3 C) (Temporal)   Wt 206 lb (93.4 kg)   SpO2 97%   BMI 29.56 kg/m     Physical Exam  General appearance: alert, well developed, well nourished, cooperative and in no distress Head: Normocephalic, without obvious abnormality, atraumatic Respiratory: Respirations even and unlabored, normal respiratory rate Extremities: All extremities are intact.  Skin: Skin color, texture, turgor normal. No rashes seen  Psych: Appropriate mood and affect. Neurologic: Mental status: Alert, oriented to person, place, and time, thought content appropriate.      Assessment & Plan     1. Essential (primary) hypertension He has gone back up to one full - amLODipine (NORVASC) 5 MG tablet; Take 1 tablet (5 mg total) by mouth every evening.  2. Need for influenza vaccination  - Flu Vaccine QUAD 6+ mos PF IM (Fluarix Quad PF)  3. Erectile dysfunction, unspecified erectile dysfunction type Advised to check GoodRx app and will likely be able to get - tadalafil (CIALIS) 20 MG tablet; Take 0.5-1 tablets (10-20 mg total) by mouth  every other day as needed for erectile dysfunction.  Dispense: 30 tablet; Refill: 1 for under $30 He does have history hypogonadism but not treated due to expense of medication. Plan on check testosterone levels with next blood drawn.   4. . Depression, unspecified depression type Initially did well when changed escitalopram to venlafaxine, but not working as well now. Will double dose to  - venlafaxine XR (EFFEXOR-XR) 150 MG 24 hr capsule; Take 1 capsule (150 mg total) by mouth every morning.  Dispense: 30 capsule; Refill: 5  6. Anxiety Double dose to- venlafaxine XR (EFFEXOR-XR) 150 MG 24 hr capsule; Take 1 capsule (150 mg total) by mouth every morning.  Dispense: 30 capsule; Refill: 5   The entirety of the information documented in the History of Present Illness, Review of Systems and Physical Exam were personally obtained by me. Portions of this information were initially documented by Kavin LeechLaura Walsh, CMA and reviewed  by me for thoroughness and accuracy.   Future Appointments  Date Time Provider Department Center  05/01/2019  9:40 AM Sherrie Mustache Demetrios Isaacs, MD [facility] None  05/29/2019  2:20 PM [facility]-NURSE HEALTH ADVISOR [facility] None       [name], MD  [facility]

## 2019-03-20 ENCOUNTER — Other Ambulatory Visit: Payer: Self-pay | Admitting: Family Medicine

## 2019-03-21 MED ORDER — HYDROCODONE-ACETAMINOPHEN 10-325 MG PO TABS: 1.0000 | ORAL_TABLET | Freq: Four times a day (QID) | ORAL | 0 refills | Status: DC | PRN

## 2019-03-29 ENCOUNTER — Ambulatory Visit: Payer: Medicare Other | Admitting: Family Medicine

## 2019-04-09 ENCOUNTER — Other Ambulatory Visit: Payer: Self-pay | Admitting: Family Medicine

## 2019-04-10 MED ORDER — HYDROCODONE-ACETAMINOPHEN 10-325 MG PO TABS: 1.0000 | ORAL_TABLET | Freq: Four times a day (QID) | ORAL | 0 refills | Status: DC | PRN

## 2019-04-17 ENCOUNTER — Other Ambulatory Visit: Payer: Self-pay | Admitting: Family Medicine

## 2019-04-17 DIAGNOSIS — F410 Panic disorder [episodic paroxysmal anxiety] without agoraphobia: Secondary | ICD-10-CM

## 2019-04-17 DIAGNOSIS — F430 Acute stress reaction: Secondary | ICD-10-CM

## 2019-04-25 ENCOUNTER — Other Ambulatory Visit: Payer: Self-pay | Admitting: Family Medicine

## 2019-04-25 NOTE — Telephone Encounter (Signed)
20 days dispensed on 04-12-2019. Next rf is not due until 10-15

## 2019-04-26 NOTE — Telephone Encounter (Signed)
[id number] 

## 2019-04-26 NOTE — Telephone Encounter (Signed)
Patient has been advised too soon for refill, we will see him in office 05/01/19.KW

## 2019-04-29 ENCOUNTER — Telehealth: Payer: Self-pay | Admitting: Family Medicine

## 2019-04-29 NOTE — Telephone Encounter (Signed)
Pt needing a refill on his HYDROcodone-acetaminophen ([place]) 10-325 MG tablet.  He made a mistake and requested it too early last week. He realized he had it filled last on 04-10-19.   Please fill at:  [facility], [place] [phone] (Phone) [phone] (Fax)   Thanks, [employer]

## 2019-04-29 NOTE — Telephone Encounter (Signed)
Please advise 

## 2019-04-30 ENCOUNTER — Other Ambulatory Visit: Payer: Self-pay | Admitting: Family Medicine

## 2019-04-30 DIAGNOSIS — M545 Low back pain, unspecified: Secondary | ICD-10-CM

## 2019-05-01 ENCOUNTER — Encounter: Payer: Self-pay | Admitting: Family Medicine

## 2019-05-01 ENCOUNTER — Other Ambulatory Visit: Payer: Self-pay

## 2019-05-01 ENCOUNTER — Ambulatory Visit (INDEPENDENT_AMBULATORY_CARE_PROVIDER_SITE_OTHER): Payer: Medicare Other | Admitting: Family Medicine

## 2019-05-01 VITALS — BP 135/85 | HR 109 | Temp 96.9°F | Resp 16 | Wt 205.6 lb

## 2019-05-01 DIAGNOSIS — G8929 Other chronic pain: Secondary | ICD-10-CM

## 2019-05-01 DIAGNOSIS — M5430 Sciatica, unspecified side: Secondary | ICD-10-CM

## 2019-05-01 DIAGNOSIS — M549 Dorsalgia, unspecified: Secondary | ICD-10-CM | POA: Diagnosis not present

## 2019-05-01 DIAGNOSIS — F419 Anxiety disorder, unspecified: Secondary | ICD-10-CM | POA: Diagnosis not present

## 2019-05-01 MED ORDER — HYDROCODONE-ACETAMINOPHEN 10-325 MG PO TABS: 1.0000 | ORAL_TABLET | Freq: Four times a day (QID) | ORAL | 0 refills | Status: DC | PRN

## 2019-05-01 MED ORDER — PREDNISONE 10 MG PO TABS: ORAL_TABLET | ORAL | 0 refills | Status: DC

## 2019-05-01 NOTE — Patient Instructions (Addendum)
   Work on weaning down on lorazepam. You should get to where you do not have to take it every day. Increasing the dose of venlafaxine should help with this.    You should consider going to physical therapy to help reduce chronic back pain. Call our office for a referral as soon as you are able   We are going to increase the venlafaxine with your next refill. Finish your current prescription of the 150mg  tablets, and we will call in a prescription to take 3 x 75 tablets daily before your next refill

## 2019-05-01 NOTE — Progress Notes (Signed)
Patient: Dylan [name] Male    DOB: [birth year]   63 y.o.   MRN: [id number] Visit Date: 05/01/2019  Today's Provider: [name], MD   Chief Complaint  Patient presents with  . Anxiety  . Depression  . Back Pain   Subjective:    Anxiety Presents for follow-up visit. Symptoms include depressed mood, dizziness, dry mouth, excessive worry, insomnia, irritability, muscle tension, nausea, nervous/anxious behavior, panic and restlessness. Patient reports no chest pain, compulsions, confusion, decreased concentration, feeling of choking, hyperventilation, impotence, malaise, obsessions, palpitations, shortness of breath or suicidal ideas. The quality of sleep is poor. Nighttime awakenings: one to two.  He feels venlafaxine has been helping, has titrated up to 150 a day. Is still taking lorazepam 2-3 times daily.   Back Pain This is a chronic problem. The current episode started in the past 7 days. The problem occurs constantly. The problem has been gradually worsening since onset. The pain is present in the lumbar spine. The quality of the pain is described as shooting and stabbing. The pain radiates to the right foot and right thigh. The pain is at a severity of 10/10. The pain is severe. The pain is the same all the time. The symptoms are aggravated by standing. Associated symptoms include leg pain, tingling and weakness. Pertinent negatives include no abdominal pain, bladder incontinence, bowel incontinence, chest pain, dysuria, fever, headaches, numbness, paresis, paresthesias, pelvic pain, perianal numbness or weight loss. Treatments tried: hydrocodone-acetaminophen. The treatment provided no relief.    Depression, Follow-up  He  was last seen for this 2 months ago. Changes made at last visit include increasing dose of Venlafaxine XR to 150mg .   He reports good compliance with treatment. He is not having side effects.   He reports good tolerance of treatment. Current symptoms  include: depressed mood He feels he is Unchanged since last visit.  ------------------------------------------------------------------------   Allergies  Allergen Reactions  . Morphine And Related Hives    Dry heaves and hives  . Belsomra  [Suvorexant]     Hallucinations  . Duloxetine     Nervous  . Meloxicam     Stomach cramps  . Morphine Other (See Comments)  . Other     CBD oil - felt anxious     Current Outpatient Medications:  .  amLODipine (NORVASC) 5 MG tablet, Take 1 tablet (5 mg total) by mouth every evening., Disp: , Rfl:  .  aspirin 81 MG tablet, Take 81 mg by mouth daily. Reported on 01/01/2016, Disp: , Rfl:  .  cyclobenzaprine (FLEXERIL) 10 MG tablet, TAKE 1 TABLET BY MOUTH 3  TIMES DAILY AS NEEDED FOR  MUSCLE SPASM(S), Disp: 90 tablet, Rfl: 4 .  diphenhydramine-acetaminophen (TYLENOL PM) 25-500 MG TABS, Take 1 tablet by mouth at bedtime as needed (pain). Reported on 01/01/2016, Disp: , Rfl:  .  fluticasone (FLONASE) 50 MCG/ACT nasal spray, fluticasone propionate 50 mcg/actuation nasal spray,suspension, Disp: , Rfl:  .  HYDROcodone-acetaminophen (NORCO) 10-325 MG tablet, Take 1-2 tablets by mouth every 6 (six) hours as needed for severe pain., Disp: 160 tablet, Rfl: 0 .  LORazepam (ATIVAN) 1 MG tablet, TAKE 1 TABLET BY MOUTH UP TO FOUR TIMES DAILY AS NEEDED FOR ANXIETY, Disp: 90 tablet, Rfl: 1 .  Melatonin 3 MG CAPS, Take 3 mg by mouth as needed. , Disp: , Rfl:  .  naproxen sodium (ALEVE) 220 MG tablet, Take 220 mg by mouth daily as needed., Disp: , Rfl:  .  omeprazole (PRILOSEC) 40 MG capsule, Take 1 capsule (40 mg total) by mouth daily., Disp: 90 capsule, Rfl: 4 .  OVER THE COUNTER MEDICATION, CEREBRA 2 TABLETS DAILY, Disp: , Rfl:  .  simvastatin (ZOCOR) 40 MG tablet, Take 1 tablet (40 mg total) by mouth daily., Disp: 90 tablet, Rfl: 4 .  tadalafil (CIALIS) 20 MG tablet, Take 0.5-1 tablets (10-20 mg total) by mouth every other day as needed for erectile dysfunction.,  Disp: 30 tablet, Rfl: 1 .  TESTOSTERONE NA, Take 3 tablets by mouth daily. Nugenix, Disp: , Rfl:  .  venlafaxine XR (EFFEXOR-XR) 150 MG 24 hr capsule, Take 1 capsule (150 mg total) by mouth every morning., Disp: 30 capsule, Rfl: 5  Review of Systems  Constitutional: Positive for irritability. Negative for fever and weight loss.  Respiratory: Negative for shortness of breath.   Cardiovascular: Negative for chest pain and palpitations.  Gastrointestinal: Positive for nausea. Negative for abdominal pain and bowel incontinence.  Genitourinary: Negative for bladder incontinence, dysuria, impotence and pelvic pain.  Musculoskeletal: Positive for back pain.  Neurological: Positive for dizziness, tingling and weakness. Negative for numbness, headaches and paresthesias.  Psychiatric/Behavioral: Negative for confusion, decreased concentration and suicidal ideas. The patient is nervous/anxious and has insomnia.     Social History   Tobacco Use  . Smoking status: Never Smoker  . Smokeless tobacco: Never Used  Substance Use Topics  . Alcohol use: Yes    Comment: occasionally 1-2 beers a month      Objective:   BP 135/85   Pulse (!) 109   Temp (!) 96.9 F (36.1 C) (Oral)   Resp 16   Wt 205 lb 9.6 oz (93.3 kg)   BMI 29.50 kg/m  Vitals:   05/01/19 0935  BP: 135/85  Pulse: (!) 109  Resp: 16  Temp: (!) 96.9 F (36.1 C)  TempSrc: Oral  Weight: 205 lb 9.6 oz (93.3 kg)  Body mass index is 29.5 kg/m.   Physical Exam  General appearance: Overweight male, cooperative and in no acute distress Head: Normocephalic, without obvious abnormality, atraumatic Respiratory: Respirations even and unlabored, normal respiratory rate Extremities: All extremities are intact.  Skin: Skin color, texture, turgor normal. No rashes seen  Psych: Appropriate mood and affect. Neurologic: Mental status: Alert, oriented to person, place, and time, thought content appropriate.       Assessment & Plan     1. Sciatica, unspecified laterality  - predniSONE (DELTASONE) 10 MG tablet; 6 tablets for 2 days, then 5 for 2 days, then 4 for 2 days, then 3 for 2 days, then 2 for 2 days, then 1 for 2 days.  Dispense: 42 tablet; Refill: 0  2. Chronic back pain, unspecified back location, unspecified back pain laterality Stable on current opioid regiment. Increasing venlafaxine as below, consider changing to duloxetine or adding gabapentin if depression improves.  Recommended physical therapy which he refuses due to cost and time constraints.   3. Anxiety He feels this is better with increased dose of venlafaxine. Will go up to 225mg  with next refill (de 10/28) and follow up in 3 months. Counseled patient that goal is not take lorazepam on daily basis.      Lelon Huh, MD  [facility]

## 2019-05-09 ENCOUNTER — Other Ambulatory Visit: Payer: Self-pay | Admitting: Family Medicine

## 2019-05-09 DIAGNOSIS — F419 Anxiety disorder, unspecified: Secondary | ICD-10-CM

## 2019-05-09 DIAGNOSIS — F32A Depression, unspecified: Secondary | ICD-10-CM

## 2019-05-09 DIAGNOSIS — F329 Major depressive disorder, single episode, unspecified: Secondary | ICD-10-CM

## 2019-05-09 MED ORDER — VENLAFAXINE HCL ER 75 MG PO CP24: 225.0000 mg | ORAL_CAPSULE | ORAL | 4 refills | Status: DC

## 2019-05-09 NOTE — Progress Notes (Signed)
Increase venlafaxine per office note 05-01-2019

## 2019-05-13 ENCOUNTER — Telehealth: Payer: Self-pay | Admitting: Family Medicine

## 2019-05-13 DIAGNOSIS — M549 Dorsalgia, unspecified: Secondary | ICD-10-CM

## 2019-05-13 DIAGNOSIS — S22009D Unspecified fracture of unspecified thoracic vertebra, subsequent encounter for fracture with routine healing: Secondary | ICD-10-CM

## 2019-05-13 DIAGNOSIS — M5126 Other intervertebral disc displacement, lumbar region: Secondary | ICD-10-CM

## 2019-05-13 DIAGNOSIS — G8929 Other chronic pain: Secondary | ICD-10-CM

## 2019-05-13 NOTE — Telephone Encounter (Signed)
Have sent order for referral to pain clinic

## 2019-05-13 NOTE — Telephone Encounter (Signed)
Pt wants to know if he can be referred out for back pain.  He siad he has had terrible back pain all weekend.  Pain medicaiton is not helping.  CB#  [phone]  Con Memos

## 2019-05-13 NOTE — Telephone Encounter (Signed)
Left patient a message advising that referral has been placed and he will receive a call.

## 2019-05-16 NOTE — Telephone Encounter (Signed)
Pt called back regarding his pain medication.  He is completely out of his pain medication and would like an early refill.  Con Memos

## 2019-05-17 MED ORDER — HYDROCODONE-ACETAMINOPHEN 10-325 MG PO TABS: 1.0000 | ORAL_TABLET | Freq: Four times a day (QID) | ORAL | 0 refills | Status: DC | PRN

## 2019-05-17 NOTE — Telephone Encounter (Signed)
Last OV 05/01/2019 and last RF 05/01/2019

## 2019-05-17 NOTE — Addendum Note (Signed)
Addended by: Lelon Huh E on: 05/17/2019 12:16 PM   Modules accepted: Orders

## 2019-05-17 NOTE — Addendum Note (Signed)
Addended by: Jules Schick on: 05/17/2019 09:41 AM   Modules accepted: Orders

## 2019-05-24 ENCOUNTER — Encounter: Payer: Self-pay | Admitting: Family Medicine

## 2019-05-24 ENCOUNTER — Ambulatory Visit
Admission: RE | Admit: 2019-05-24 | Discharge: 2019-05-24 | Disposition: A | Discharge status: Home / Self Care | Payer: Medicare Other | Source: Ambulatory Visit | Attending: Family Medicine | Admitting: Family Medicine

## 2019-05-24 ENCOUNTER — Encounter

## 2019-05-24 ENCOUNTER — Other Ambulatory Visit: Payer: Self-pay

## 2019-05-24 ENCOUNTER — Ambulatory Visit (INDEPENDENT_AMBULATORY_CARE_PROVIDER_SITE_OTHER): Payer: Medicare Other | Admitting: Family Medicine

## 2019-05-24 VITALS — BP 112/72 | HR 104 | Temp 97.3°F | Resp 18 | Wt 201.0 lb

## 2019-05-24 DIAGNOSIS — M545 Low back pain, unspecified: Secondary | ICD-10-CM

## 2019-05-24 MED ORDER — KETOROLAC TROMETHAMINE 60 MG/2ML IM SOLN
60.0000 mg | Freq: Once | INTRAMUSCULAR | Status: AC
  Administered 2019-05-24: 60 mg via INTRAMUSCULAR

## 2019-05-24 MED ORDER — DICLOFENAC SODIUM 50 MG PO TBEC: 50.0000 mg | DELAYED_RELEASE_TABLET | Freq: Two times a day (BID) | ORAL | 3 refills | Status: DC

## 2019-05-24 NOTE — Progress Notes (Signed)
Patient: Dylan [name] Male    DOB: [birth year]   63 y.o.   MRN: [id number] Visit Date: 05/24/2019  Today's Provider: Lelon Huh, MD   Chief Complaint  Patient presents with  . Leg Pain   Subjective:     Leg Pain  The incident occurred at home. The injury mechanism was a fall (has had a few falls in the past couple of weeks). The pain is present in the right hip and right leg. The pain has been worsening since onset. Associated symptoms include an inability to bear weight.  He states that falls occurred when he is trying to sit down in the dark and misjudged position of chair causing him to fall to floor or onto other furniture. Pain shoots down side of back of right leg all the way to foot. Has 12 day prednisone taper about 3 weeks ago which he states provided minimal relief. He is now 4 years status post discectomy by Dr. Arnoldo Morale.   Allergies  Allergen Reactions  . Morphine And Related Hives    Dry heaves and hives  . Belsomra  [Suvorexant]     Hallucinations  . Duloxetine     Nervous  . Meloxicam     Stomach cramps  . Morphine Other (See Comments)  . Other     CBD oil - felt anxious     Current Outpatient Medications:  .  amLODipine (NORVASC) 5 MG tablet, Take 1 tablet (5 mg total) by mouth every evening., Disp: , Rfl:  .  aspirin 81 MG tablet, Take 81 mg by mouth daily. Reported on 01/01/2016, Disp: , Rfl:  .  cyclobenzaprine (FLEXERIL) 10 MG tablet, TAKE 1 TABLET BY MOUTH 3  TIMES DAILY AS NEEDED FOR  MUSCLE SPASM(S), Disp: 90 tablet, Rfl: 4 .  diphenhydramine-acetaminophen (TYLENOL PM) 25-500 MG TABS, Take 1 tablet by mouth at bedtime as needed (pain). Reported on 01/01/2016, Disp: , Rfl:  .  fluticasone (FLONASE) 50 MCG/ACT nasal spray, fluticasone propionate 50 mcg/actuation nasal spray,suspension, Disp: , Rfl:  .  HYDROcodone-acetaminophen (NORCO) 10-325 MG tablet, Take 1-2 tablets by mouth every 6 (six) hours as needed for severe pain., Disp: 160 tablet, Rfl:  0 .  LORazepam (ATIVAN) 1 MG tablet, TAKE 1 TABLET BY MOUTH UP TO FOUR TIMES DAILY AS NEEDED FOR ANXIETY, Disp: 90 tablet, Rfl: 1 .  Melatonin 3 MG CAPS, Take 3 mg by mouth as needed. , Disp: , Rfl:  .  naproxen sodium (ALEVE) 220 MG tablet, Take 220 mg by mouth daily as needed., Disp: , Rfl:  .  omeprazole (PRILOSEC) 40 MG capsule, Take 1 capsule (40 mg total) by mouth daily., Disp: 90 capsule, Rfl: 4 .  OVER THE COUNTER MEDICATION, CEREBRA 2 TABLETS DAILY, Disp: , Rfl:  .  simvastatin (ZOCOR) 40 MG tablet, Take 1 tablet (40 mg total) by mouth daily., Disp: 90 tablet, Rfl: 4 .  tadalafil (CIALIS) 20 MG tablet, Take 0.5-1 tablets (10-20 mg total) by mouth every other day as needed for erectile dysfunction., Disp: 30 tablet, Rfl: 1 .  TESTOSTERONE NA, Take 3 tablets by mouth daily. Nugenix, Disp: , Rfl:  .  venlafaxine XR (EFFEXOR-XR) 75 MG 24 hr capsule, Take 3 capsules (225 mg total) by mouth every morning., Disp: 270 capsule, Rfl: 4  Review of Systems  Constitutional: Negative for appetite change, chills and fever.  Respiratory: Negative for chest tightness, shortness of breath and wheezing.   Cardiovascular: Negative for  chest pain and palpitations.  Gastrointestinal: Negative for abdominal pain, nausea and vomiting.  Musculoskeletal: Positive for arthralgias, back pain, gait problem and myalgias.  Neurological: Positive for weakness (on right side).  Hematological: Bruises/bleeds easily (on right side of trunk).    Social History   Tobacco Use  . Smoking status: Never Smoker  . Smokeless tobacco: Never Used  Substance Use Topics  . Alcohol use: Yes    Comment: occasionally 1-2 beers a month      Objective:   BP 112/72 (BP Location: Left Arm, Patient Position: Sitting, Cuff Size: Large)   Pulse (!) 104   Temp (!) 97.3 F (36.3 C) (Temporal)   Resp 18   Wt 201 lb (91.2 kg)   SpO2 96% Comment: room air  BMI 28.84 kg/m  Vitals:   05/24/19 1520  BP: 112/72  Pulse: (!) 104   Resp: 18  Temp: (!) 97.3 F (36.3 C)  TempSrc: Temporal  SpO2: 96%  Weight: 201 lb (91.2 kg)  Body mass index is 28.84 kg/m.   Physical Exam  General appearance: Well developed, well nourished male, cooperative and in no acute distress Head: Normocephalic, without obvious abnormality, atraumatic Respiratory: Respirations even and unlabored, normal respiratory rate Extremities: Patient sitting wheelchair. Is able to standing slowing without assistance. Diffuse tenderness lumbar spine and para lumbar muscles.  Skin: Skin color, texture, turgor normal. No rashes seen  Psych: Appropriate mood and affect. Neurologic: Mental status: Alert, oriented to person, place, and time, thought content appropriate.      Assessment & Plan    1. Low back pain potentially associated with radiculopathy Progressive despite months of conservative therapy. Likely nerve impingement. Obtain- DG Lumbar Spine Complete; Future  Consider MRI, consider referral for epidural injections  - diclofenac (VOLTAREN) 50 MG EC tablet; Take 1 tablet (50 mg total) by mouth 2 (two) times daily. Take with food  Dispense: 30 tablet; Refill: 3 - ketorolac (TORADOL) injection 60 mg     [name], MD  [facility]

## 2019-05-26 ENCOUNTER — Encounter: Payer: Self-pay | Admitting: Family Medicine

## 2019-05-27 ENCOUNTER — Encounter: Payer: Self-pay | Admitting: Family Medicine

## 2019-05-27 ENCOUNTER — Other Ambulatory Visit: Payer: Self-pay | Admitting: Family Medicine

## 2019-05-27 ENCOUNTER — Telehealth: Payer: Self-pay | Admitting: Family Medicine

## 2019-05-27 DIAGNOSIS — M545 Low back pain, unspecified: Secondary | ICD-10-CM

## 2019-05-27 DIAGNOSIS — F430 Acute stress reaction: Secondary | ICD-10-CM

## 2019-05-27 DIAGNOSIS — M5416 Radiculopathy, lumbar region: Secondary | ICD-10-CM

## 2019-05-27 DIAGNOSIS — F410 Panic disorder [episodic paroxysmal anxiety] without agoraphobia: Secondary | ICD-10-CM

## 2019-05-27 DIAGNOSIS — M5126 Other intervertebral disc displacement, lumbar region: Secondary | ICD-10-CM

## 2019-05-27 NOTE — Telephone Encounter (Signed)
Last refill 04/18/2019 #90 with 1 refill.

## 2019-05-27 NOTE — Telephone Encounter (Signed)
Patient advised.

## 2019-05-27 NOTE — Telephone Encounter (Signed)
He is taking excessive amount of medication. Will not be filled until 05-31-2019

## 2019-05-27 NOTE — Telephone Encounter (Signed)
-----   Message from Birdie Sons, MD sent at 05/27/2019  1:05 PM EST ----- Odette Horns shows a lot of degenerative changes. Need to proceed with MRI, have placed order.

## 2019-05-27 NOTE — Telephone Encounter (Signed)
Pt called back stating he cannot wait until Dec. To get his Lorazepam filled. He was told by the pharmacy it can be filled in Nov. 13th if approved by his PCP.  Please call pt back to advise.  Thanks, [employer]

## 2019-05-27 NOTE — Telephone Encounter (Signed)
Pt is requesting something be called into [place], [place] to help him relax while getting MRI

## 2019-05-27 NOTE — Telephone Encounter (Signed)
Patient advised Lorazepam can be filled on 11/13 as told previously.

## 2019-05-27 NOTE — Telephone Encounter (Signed)
Pt needing results on Xrays done on Friday.   Please call pt back at [phone] to give results.  Pt also needing a refill on LORazepam (ATIVAN) 1 MG tablet - doesn't have enough to last until Wed.  He has had more anxiety and panic attacks due to all the issues.  Asking/Needing an early refill at:  [place], [place] [address] [phone] (Phone) [phone] (Fax)   Thanks, [employer]

## 2019-05-28 ENCOUNTER — Telehealth: Payer: Self-pay | Admitting: *Deleted

## 2019-05-28 DIAGNOSIS — M5430 Sciatica, unspecified side: Secondary | ICD-10-CM

## 2019-05-28 MED ORDER — PREDNISONE 10 MG PO TABS: ORAL_TABLET | ORAL | 0 refills | Status: AC

## 2019-05-28 MED ORDER — BUSPIRONE HCL 15 MG PO TABS: ORAL_TABLET | ORAL | 1 refills | Status: DC

## 2019-05-28 NOTE — Telephone Encounter (Signed)
Start buspirone for anxiety. He can still take lorazepam as needed.  Have also sent prescription for prednisone taper [place] appointment, there is nothing else to do for him until the MRI is done I'll sent in prescription on Friday for a valium that he take prior to procedure.

## 2019-05-28 NOTE — Telephone Encounter (Signed)
Rescheduled for 06-02-2019

## 2019-05-28 NOTE — Telephone Encounter (Signed)
MRI scheduled for 06-08-2019

## 2019-05-28 NOTE — Telephone Encounter (Signed)
Patient advised. Appointment canceled

## 2019-05-28 NOTE — Telephone Encounter (Signed)
Patient agrees to start Buspirone and take Valium before procedure.

## 2019-05-28 NOTE — Telephone Encounter (Signed)
Pt calling back to let Dr. Caryn Section know Buspirone - was filled   The other 2 are not called in according to [facility]. predniSONE (DELTASONE) 10 MG tablet  And something to calm him down.  Thanks, [employer]

## 2019-05-28 NOTE — Progress Notes (Signed)
Subjective:   Dylan [name] is a 63 y.o. male who presents for Medicare Annual/Subsequent preventive examination.    This visit is being conducted through telemedicine due to the COVID-19 pandemic. This patient has given me verbal consent via doximity to conduct this visit, patient states they are participating from their home address. Some vital signs may be absent or patient reported.    Patient identification: identified by name, DOB, and current address  Review of Systems:  N/A  Cardiac Risk Factors include: advanced age (>62men, >80 women);hypertension;male gender;dyslipidemia     Objective:    Vitals: There were no vitals taken for this visit.  There is no height or weight on file to calculate BMI. Unable to obtain vitals due to visit being conducted via telephonically.   Advanced Directives 05/29/2019 05/24/2018 07/05/2017 04/20/2016  Does Patient Have a Medical Advance Directive? No No No No  Would patient like information on creating a medical advance directive? No - Patient declined No - Patient declined No - Patient declined -  Pre-existing out of facility DNR order (yellow form or pink MOST form) - - Yellow form placed in chart (order not valid for inpatient use) -    Tobacco Social History   Tobacco Use  Smoking Status Never Smoker  Smokeless Tobacco Never Used     Counseling given: Not Answered   Clinical Intake:  Pre-visit preparation completed: Yes  Pain : 0-10 Pain Score: 6  Pain Type: Acute pain Pain Location: Back Pain Orientation: Right, Lower Pain Descriptors / Indicators: Throbbing Pain Frequency: Intermittent     Nutritional Risks: None Diabetes: No  How often do you need to have someone help you when you read instructions, pamphlets, or other written materials from your doctor or pharmacy?: 1 - Never  Interpreter Needed?: No  Information entered by :: [facility], LPN  Past Medical History:  Diagnosis Date  . History of chicken pox    . History of measles as a child   . History of mumps as a child   . Seizures (HCC)    blacked out in shower when coming off benzo - 'withdrawals'  . Shortness of breath dyspnea    with exertion ? d/t stress & anxiety   Past Surgical History:  Procedure Laterality Date  . COLONOSCOPY WITH PROPOFOL N/A 07/05/2017   Procedure: COLONOSCOPY WITH PROPOFOL;  Surgeon: Wyline Mood, MD;  Location[facility] ENDOSCOPY;  Service: Gastroenterology;  Laterality: N/A;  . JOINT REPLACEMENT    . KNEE SURGERY Right 1993  . LUMBAR DISC SURGERY  03/18/2015   R L4-5 micro discectomy, Dr. Lovell Sheehan  . LUMBAR LAMINECTOMY/DECOMPRESSION MICRODISCECTOMY Right 03/18/2015   Procedure: Right Lumbar Four-Five Microdiscectomy;  Surgeon: Tressie Stalker, MD;  Location: [facility] NEURO ORS;  Service: Neurosurgery;  Laterality: Right;  Right L45 microdiskectomy  . LUNG SURGERY Left 2009   Lung Collapse: left chest tube  . ROTATOR CUFF REPAIR  2004   had rotator cuff injury and surgery was performed  . TONSILLECTOMY    . TONSILLECTOMY AND ADENOIDECTOMY  1960   Family History  Problem Relation Age of Onset  . Alzheimer's disease Mother   . Stroke Mother   . Heart disease Father   . Congestive Heart Failure Brother   . CAD Brother   . Congestive Heart Failure Sister    Social History   Socioeconomic History  . Marital status: Divorced    Spouse name: Not on file  . Number of children: 3  . Years of education:  Not on file  . Highest education level: High school graduate  Occupational History  . Occupation: disability  Social Needs  . Financial resource strain: Somewhat hard  . Food insecurity    Worry: Never true    Inability: Never true  . Transportation needs    Medical: No    Non-medical: No  Tobacco Use  . Smoking status: Never Smoker  . Smokeless tobacco: Never Used  Substance and Sexual Activity  . Alcohol use: Not Currently  . Drug use: No  . Sexual activity: Yes  Lifestyle  . Physical activity     Days per week: 0 days    Minutes per session: 0 min  . Stress: Very much  Relationships  . Social Herbalist on phone: Patient refused    Gets together: Patient refused    Attends religious service: Patient refused    Active member of club or organization: Patient refused    Attends meetings of clubs or organizations: Patient refused    Relationship status: Patient refused  Other Topics Concern  . Not on file  Social History Narrative   ** Merged History Encounter **        Outpatient Encounter Medications as of 05/29/2019  Medication Sig  . amLODipine (NORVASC) 5 MG tablet Take 1 tablet (5 mg total) by mouth every evening.  Marland Kitchen aspirin 81 MG tablet Take 81 mg by mouth daily. Reported on 01/01/2016  . busPIRone (BUSPAR) 15 MG tablet 1/2 tablet twice daily for 4 days, then 1/2 tablet in the morning and 1 at night for 4 days, then 1 tablet twice daily.  . cyclobenzaprine (FLEXERIL) 10 MG tablet TAKE 1 TABLET BY MOUTH 3  TIMES DAILY AS NEEDED FOR  MUSCLE SPASM(S)  . diclofenac (VOLTAREN) 50 MG EC tablet Take 1 tablet (50 mg total) by mouth 2 (two) times daily. Take with food  . diphenhydramine-acetaminophen (TYLENOL PM) 25-500 MG TABS Take 1 tablet by mouth at bedtime as needed (pain). Reported on 01/01/2016  . fluticasone (FLONASE) 50 MCG/ACT nasal spray fluticasone propionate 50 mcg/actuation nasal spray,suspension  . HYDROcodone-acetaminophen (NORCO) 10-325 MG tablet Take 1-2 tablets by mouth every 6 (six) hours as needed for severe pain.  Marland Kitchen LORazepam (ATIVAN) 1 MG tablet TAKE 1 TABLET BY MOUTH UP TO FOUR TIMES DAILY AS NEEDED FOR ANXIETY  . Melatonin 3 MG CAPS Take 3 mg by mouth as needed.   . naproxen sodium (ALEVE) 220 MG tablet Take 220 mg by mouth daily as needed.  Marland Kitchen omeprazole (PRILOSEC) 40 MG capsule Take 1 capsule (40 mg total) by mouth daily.  Marland Kitchen OVER THE COUNTER MEDICATION CEREBRA 2 TABLETS DAILY  . predniSONE (DELTASONE) 10 MG tablet 6 tablets for 2 days, then 5 for 2  days, then 4 for 2 days, then 3 for 2 days, then 2 for 2 days, then 1 for 2 days.  . simvastatin (ZOCOR) 40 MG tablet Take 1 tablet (40 mg total) by mouth daily.  . tadalafil (CIALIS) 20 MG tablet Take 0.5-1 tablets (10-20 mg total) by mouth every other day as needed for erectile dysfunction.  . TESTOSTERONE NA Take 3 tablets by mouth daily. Nugenix  . venlafaxine XR (EFFEXOR-XR) 75 MG 24 hr capsule Take 3 capsules (225 mg total) by mouth every morning.   No facility-administered encounter medications on file as of 05/29/2019.     Activities of Daily Living In your present state of health, do you have any difficulty performing the following activities:  05/29/2019  Hearing? N  Vision? N  Difficulty concentrating or making decisions? N  Walking or climbing stairs? Y  Comment Due to back pain.  Dressing or bathing? N  Doing errands, shopping? N  Preparing Food and eating ? N  Using the Toilet? N  In the past six months, have you accidently leaked urine? N  Do you have problems with loss of bowel control? N  Managing your Medications? N  Managing your Finances? N  Housekeeping or managing your Housekeeping? N  Some recent data might be hidden    Patient Care Team: Malva Limes, MD as PCP - General (Family Medicine) Tressie Stalker, MD as Consulting Physician (Neurosurgery)   Assessment:   This is a routine wellness examination for Dylan [name].  Exercise Activities and Dietary recommendations Current Exercise Habits: The patient does not participate in regular exercise at present, Exercise limited by: orthopedic condition(s)  Goals    . Prevent falls     Recommend to remove any items from the home that may cause slips or trips.           Fall Risk: Fall Risk  05/29/2019 05/24/2018 04/01/2015  Falls in the past year? 1 1 Yes  Number falls in past yr: 1 1 2  or more  Injury with Fall? 0 0 Yes  Risk for fall due to : - Other (Comment) -  Risk for fall due to: Comment -  "Looses balance when climbing steps." -  Follow up Falls prevention discussed - -    FALL RISK PREVENTION PERTAINING TO THE HOME:  Any stairs in or around the home? Yes  If so, are there any without handrails? No   Home free of loose throw rugs in walkways, pet beds, electrical cords, etc? Yes  Adequate lighting in your home to reduce risk of falls? Yes   ASSISTIVE DEVICES UTILIZED TO PREVENT FALLS:  Life alert? No  Use of a cane, walker or w/c? No  Grab bars in the bathroom? No  Shower chair or bench in shower? Yes  Elevated toilet seat or a handicapped toilet? No   TIMED UP AND GO:  Was the test performed? No .    Depression Screen PHQ 2/9 Scores 05/29/2019 12/25/2018 05/24/2018 04/25/2017  PHQ - 2 Score 5 4 5 5   PHQ- 9 Score 20 13 21 19     Cognitive Function: Declined today.     6CIT Screen 05/24/2018  What Year? 0 points  What month? 0 points  What time? 0 points  Count back from 20 0 points  Months in reverse 0 points  Repeat phrase 2 points  Total Score 2    Immunization History  Administered Date(s) Administered  . Influenza Split 04/07/2008  . Influenza,inj,Quad PF,6+ Mos 04/05/2013, 04/15/2014, 04/01/2015, 04/20/2016, 04/25/2017, 03/23/2018, 03/13/2019  . Tdap 09/12/2008  . Zoster 05/06/2016    Qualifies for Shingles Vaccine? Yes  Zostavax completed 05/06/16. Due for Shingrix. Education has been provided regarding the importance of this vaccine. Pt has been advised to call insurance company to determine out of pocket expense. Advised may also receive vaccine at local pharmacy or Health Dept. Verbalized acceptance and understanding.  Tdap: Although this vaccine is not a covered service during a Wellness Exam, does the patient still wish to receive this vaccine today?  No .   Flu Vaccine: Up to date   Screening Tests Health Maintenance  Topic Date Due  . TETANUS/TDAP  09/12/2018  . COLONOSCOPY  07/06/2027  . INFLUENZA VACCINE  Completed  . Hepatitis  C Screening  Completed  . HIV Screening  Completed   Cancer Screenings:  Colorectal Screening: Completed 07/05/17. Repeat every 10 years.  Lung Cancer Screening: (Low Dose CT Chest recommended if Age 25-80 years, 30 pack-year currently smoking OR have quit w/in 15years.) does not qualify.   Additional Screening:  Hepatitis C Screening: Up to date  Vision Screening: Recommended annual ophthalmology exams for early detection of glaucoma and other disorders of the eye.  Dental Screening: Recommended annual dental exams for proper oral hygiene  Community Resource Referral:  CRR required this visit?  No        Plan:  I have personally reviewed and addressed the Medicare Annual Wellness questionnaire and have noted the following in the patient's chart:  A. Medical and social history B. Use of alcohol, tobacco or illicit drugs  C. Current medications and supplements D. Functional ability and status E.  Nutritional status F.  Physical activity G. Advance directives H. List of other physicians I.  Hospitalizations, surgeries, and ER visits in previous 12 months J.  Vitals K. Screenings such as hearing and vision if needed, cognitive and depression L. Referrals and appointments   In addition, I have reviewed and discussed with patient certain preventive protocols, quality metrics, and best practice recommendations. A written personalized care plan for preventive services as well as general preventive health recommendations were provided to patient.   Darrick HuntsmanSigned,   Tanyia Grabbe, LPN  81/[id number]/05/2019 Nurse Health Advisor   Nurse Notes: None.

## 2019-05-28 NOTE — Telephone Encounter (Signed)
Patient called office to inform Dr. Caryn Section he rescheduled MRI to 06/02/2019. Patient also wanted to let Dr. Caryn Section know he is having a lot of anxiety and pain. Patient called back and scheduled an office visit for tomorrow to come in to discuss the above symptoms. Please advise?

## 2019-05-29 ENCOUNTER — Ambulatory Visit: Payer: Self-pay | Admitting: Family Medicine

## 2019-05-29 ENCOUNTER — Other Ambulatory Visit: Payer: Self-pay

## 2019-05-29 ENCOUNTER — Ambulatory Visit (INDEPENDENT_AMBULATORY_CARE_PROVIDER_SITE_OTHER): Payer: Medicare Other

## 2019-05-29 DIAGNOSIS — Z0000 Encounter for general adult medical examination without abnormal findings: Secondary | ICD-10-CM | POA: Diagnosis not present

## 2019-05-29 NOTE — Patient Instructions (Signed)
[name] , Thank you for taking time to come for your Medicare Wellness Visit. I appreciate your ongoing commitment to your health goals. Please review the following plan we discussed and let me know if I can assist you in the future.   Screening recommendations/referrals: Colonoscopy: Up to date, due 06/2027 Recommended yearly ophthalmology/optometry visit for glaucoma screening and checkup Recommended yearly dental visit for hygiene and checkup  Vaccinations: Influenza vaccine: Up to date Tdap vaccine: Pt declines today.  Shingles vaccine: Pt declines today.   Advanced directives: Advance directive discussed with you today. Even though you declined this today please call our office should you change your mind and we can give you the proper paperwork for you to fill out.  Conditions/risks identified: Fall risk prevention discussed today.   Next appointment: 08/13/18 @ 11:00 AM with Dr Dylan [name].   Preventive Care 40-64 Years, Male Preventive care refers to lifestyle choices and visits with your health care provider that can promote health and wellness. What does preventive care include?  A yearly physical exam. This is also called an annual well check.  Dental exams once or twice a year.  Routine eye exams. Ask your health care provider how often you should have your eyes checked.  Personal lifestyle choices, including:  Daily care of your teeth and gums.  Regular physical activity.  Eating a healthy diet.  Avoiding tobacco and drug use.  Limiting alcohol use.  Practicing safe sex.  Taking low-dose aspirin every day starting at age 49. What happens during an annual well check? The services and screenings done by your health care provider during your annual well check will depend on your age, overall health, lifestyle risk factors, and family history of disease. Counseling  Your health care provider may ask you questions about your:  Alcohol use.  Tobacco use.  Drug  use.  Emotional well-being.  Home and relationship well-being.  Sexual activity.  Eating habits.  Work and work Statistician. Screening  You may have the following tests or measurements:  Height, weight, and BMI.  Blood pressure.  Lipid and cholesterol levels. These may be checked every 5 years, or more frequently if you are over 56 years old.  Skin check.  Lung cancer screening. You may have this screening every year starting at age 6 if you have a 30-pack-year history of smoking and currently smoke or have quit within the past 15 years.  Fecal occult blood test (FOBT) of the stool. You may have this test every year starting at age 24.  Flexible sigmoidoscopy or colonoscopy. You may have a sigmoidoscopy every 5 years or a colonoscopy every 10 years starting at age 81.  Prostate cancer screening. Recommendations will vary depending on your family history and other risks.  Hepatitis C blood test.  Hepatitis B blood test.  Sexually transmitted disease (STD) testing.  Diabetes screening. This is done by checking your blood sugar (glucose) after you have not eaten for a while (fasting). You may have this done every 1-3 years. Discuss your test results, treatment options, and if necessary, the need for more tests with your health care provider. Vaccines  Your health care provider may recommend certain vaccines, such as:  Influenza vaccine. This is recommended every year.  Tetanus, diphtheria, and acellular pertussis (Tdap, Td) vaccine. You may need a Td booster every 10 years.  Zoster vaccine. You may need this after age 71.  Pneumococcal 13-valent conjugate (PCV13) vaccine. You may need this if you have certain conditions  and have not been vaccinated.  Pneumococcal polysaccharide (PPSV23) vaccine. You may need one or two doses if you smoke cigarettes or if you have certain conditions. Talk to your health care provider about which screenings and vaccines you need and how  often you need them. This information is not intended to replace advice given to you by your health care provider. Make sure you discuss any questions you have with your health care provider. Document Released: 07/31/2015 Document Revised: 03/23/2016 Document Reviewed: 05/05/2015 Elsevier Interactive Patient Education  2017 [employer].  Fall Prevention in the Home Falls can cause injuries. They can happen to people of all ages. There are many things you can do to make your home safe and to help prevent falls. What can I do on the outside of my home?  Regularly fix the edges of walkways and driveways and fix any cracks.  Remove anything that might make you trip as you walk through a door, such as a raised step or threshold.  Trim any bushes or trees on the path to your home.  Use bright outdoor lighting.  Clear any walking paths of anything that might make someone trip, such as rocks or tools.  Regularly check to see if handrails are loose or broken. Make sure that both sides of any steps have handrails.  Any raised decks and porches should have guardrails on the edges.  Have any leaves, snow, or ice cleared regularly.  Use sand or salt on walking paths during winter.  Clean up any spills in your garage right away. This includes oil or grease spills. What can I do in the bathroom?  Use night lights.  Install grab bars by the toilet and in the tub and shower. Do not use towel bars as grab bars.  Use non-skid mats or decals in the tub or shower.  If you need to sit down in the shower, use a plastic, non-slip stool.  Keep the floor dry. Clean up any water that spills on the floor as soon as it happens.  Remove soap buildup in the tub or shower regularly.  Attach bath mats securely with double-sided non-slip rug tape.  Do not have throw rugs and other things on the floor that can make you trip. What can I do in the bedroom?  Use night lights.  Make sure that you have a  light by your bed that is easy to reach.  Do not use any sheets or blankets that are too big for your bed. They should not hang down onto the floor.  Have a firm chair that has side arms. You can use this for support while you get dressed.  Do not have throw rugs and other things on the floor that can make you trip. What can I do in the kitchen?  Clean up any spills right away.  Avoid walking on wet floors.  Keep items that you use a lot in easy-to-reach places.  If you need to reach something above you, use a strong step stool that has a grab bar.  Keep electrical cords out of the way.  Do not use floor polish or wax that makes floors slippery. If you must use wax, use non-skid floor wax.  Do not have throw rugs and other things on the floor that can make you trip. What can I do with my stairs?  Do not leave any items on the stairs.  Make sure that there are handrails on both sides of the stairs  and use them. Fix handrails that are broken or loose. Make sure that handrails are as long as the stairways.  Check any carpeting to make sure that it is firmly attached to the stairs. Fix any carpet that is loose or worn.  Avoid having throw rugs at the top or bottom of the stairs. If you do have throw rugs, attach them to the floor with carpet tape.  Make sure that you have a light switch at the top of the stairs and the bottom of the stairs. If you do not have them, ask someone to add them for you. What else can I do to help prevent falls?  Wear shoes that:  Do not have high heels.  Have rubber bottoms.  Are comfortable and fit you well.  Are closed at the toe. Do not wear sandals.  If you use a stepladder:  Make sure that it is fully opened. Do not climb a closed stepladder.  Make sure that both sides of the stepladder are locked into place.  Ask someone to hold it for you, if possible.  Clearly mark and make sure that you can see:  Any grab bars or handrails.   First and last steps.  Where the edge of each step is.  Use tools that help you move around (mobility aids) if they are needed. These include:  Canes.  Walkers.  Scooters.  Crutches.  Turn on the lights when you go into a dark area. Replace any light bulbs as soon as they burn out.  Set up your furniture so you have a clear path. Avoid moving your furniture around.  If any of your floors are uneven, fix them.  If there are any pets around you, be aware of where they are.  Review your medicines with your doctor. Some medicines can make you feel dizzy. This can increase your chance of falling. Ask your doctor what other things that you can do to help prevent falls. This information is not intended to replace advice given to you by your health care provider. Make sure you discuss any questions you have with your health care provider. Document Released: 04/30/2009 Document Revised: 12/10/2015 Document Reviewed: 08/08/2014 Elsevier Interactive Patient Education  2017 [employer].

## 2019-05-31 ENCOUNTER — Other Ambulatory Visit: Payer: Self-pay | Admitting: *Deleted

## 2019-05-31 ENCOUNTER — Encounter: Payer: Self-pay | Admitting: Family Medicine

## 2019-05-31 DIAGNOSIS — F430 Acute stress reaction: Secondary | ICD-10-CM

## 2019-05-31 DIAGNOSIS — F410 Panic disorder [episodic paroxysmal anxiety] without agoraphobia: Secondary | ICD-10-CM

## 2019-05-31 MED ORDER — DIAZEPAM 5 MG PO TABS: ORAL_TABLET | ORAL | 0 refills | Status: DC

## 2019-05-31 MED ORDER — LORAZEPAM 1 MG PO TABS: 1.0000 mg | ORAL_TABLET | Freq: Four times a day (QID) | ORAL | 1 refills | Status: DC | PRN

## 2019-05-31 NOTE — Telephone Encounter (Signed)
Pt calling back on Lorazepam 1mg . Pt took last one yesterday. Pt said Caryn Section told him his Rx will be filled today.  He said he spoke with Pharmacy.  They told him they will fill it if it is sent in.  He asked if someone could please call him back to let pt know if it can be filled today.  Thanks, [employer]

## 2019-05-31 NOTE — Telephone Encounter (Signed)
Patient called office stating he out of Lorazepam 1 mg and really needs It refilled. Patient states he states he can not control his anxiety without his medication. Please advise?

## 2019-06-02 ENCOUNTER — Ambulatory Visit
Admission: RE | Admit: 2019-06-02 | Discharge: 2019-06-02 | Disposition: A | Discharge status: Home / Self Care | Payer: Medicare Other | Source: Ambulatory Visit | Attending: Family Medicine | Admitting: Family Medicine

## 2019-06-02 ENCOUNTER — Other Ambulatory Visit: Payer: Self-pay

## 2019-06-02 DIAGNOSIS — M5416 Radiculopathy, lumbar region: Secondary | ICD-10-CM | POA: Diagnosis not present

## 2019-06-02 DIAGNOSIS — M545 Low back pain: Secondary | ICD-10-CM | POA: Diagnosis not present

## 2019-06-03 ENCOUNTER — Encounter: Payer: Self-pay | Admitting: Family Medicine

## 2019-06-03 ENCOUNTER — Other Ambulatory Visit: Payer: Self-pay | Admitting: Family Medicine

## 2019-06-03 NOTE — Telephone Encounter (Signed)
LOV 05/24/2019 and last RF 05/17/2019

## 2019-06-04 ENCOUNTER — Other Ambulatory Visit: Payer: Self-pay | Admitting: Family Medicine

## 2019-06-04 ENCOUNTER — Telehealth: Payer: Self-pay

## 2019-06-04 DIAGNOSIS — M545 Low back pain, unspecified: Secondary | ICD-10-CM

## 2019-06-04 DIAGNOSIS — S22009D Unspecified fracture of unspecified thoracic vertebra, subsequent encounter for fracture with routine healing: Secondary | ICD-10-CM

## 2019-06-04 DIAGNOSIS — M5030 Other cervical disc degeneration, unspecified cervical region: Secondary | ICD-10-CM

## 2019-06-04 DIAGNOSIS — M5126 Other intervertebral disc displacement, lumbar region: Secondary | ICD-10-CM

## 2019-06-04 MED ORDER — HYDROCODONE-ACETAMINOPHEN 10-325 MG PO TABS: 1.0000 | ORAL_TABLET | Freq: Four times a day (QID) | ORAL | 0 refills | Status: DC | PRN

## 2019-06-04 NOTE — Telephone Encounter (Signed)
Dylan [name] (Patient) Dylan [name] (Patient) General - Other  Reason for CRM: Pt requests MRI results. Pt requests call back

## 2019-06-08 ENCOUNTER — Ambulatory Visit: Payer: Medicare Other

## 2019-06-11 DIAGNOSIS — M5136 Other intervertebral disc degeneration, lumbar region: Secondary | ICD-10-CM | POA: Diagnosis not present

## 2019-06-11 DIAGNOSIS — I10 Essential (primary) hypertension: Secondary | ICD-10-CM | POA: Diagnosis not present

## 2019-06-11 DIAGNOSIS — M5137 Other intervertebral disc degeneration, lumbosacral region: Secondary | ICD-10-CM | POA: Diagnosis not present

## 2019-06-11 DIAGNOSIS — M545 Low back pain: Secondary | ICD-10-CM | POA: Diagnosis not present

## 2019-06-11 DIAGNOSIS — M48062 Spinal stenosis, lumbar region with neurogenic claudication: Secondary | ICD-10-CM | POA: Diagnosis not present

## 2019-06-19 ENCOUNTER — Other Ambulatory Visit: Payer: Self-pay | Admitting: Neurosurgery

## 2019-06-19 ENCOUNTER — Telehealth: Payer: Self-pay | Admitting: Nurse Practitioner

## 2019-06-19 DIAGNOSIS — M48062 Spinal stenosis, lumbar region with neurogenic claudication: Secondary | ICD-10-CM

## 2019-06-19 NOTE — Telephone Encounter (Signed)
Phone call to patient to verify medication list and allergies for myelogram procedure. Pt instructed to hold Effexor, and Buspirone for 48hrs prior to myelogram appointment time. Pt verbalized understanding. Pre and post procedure instructions reviewed with pt.

## 2019-06-20 ENCOUNTER — Other Ambulatory Visit: Payer: Self-pay | Admitting: Family Medicine

## 2019-06-21 MED ORDER — HYDROCODONE-ACETAMINOPHEN 10-325 MG PO TABS: 1.0000 | ORAL_TABLET | Freq: Four times a day (QID) | ORAL | 0 refills | Status: DC | PRN

## 2019-07-01 ENCOUNTER — Ambulatory Visit
Admission: RE | Admit: 2019-07-01 | Discharge: 2019-07-01 | Disposition: A | Discharge status: Home / Self Care | Payer: Medicare Other | Source: Ambulatory Visit | Attending: Neurosurgery | Admitting: Neurosurgery

## 2019-07-01 DIAGNOSIS — M48061 Spinal stenosis, lumbar region without neurogenic claudication: Secondary | ICD-10-CM | POA: Diagnosis not present

## 2019-07-01 DIAGNOSIS — M48062 Spinal stenosis, lumbar region with neurogenic claudication: Secondary | ICD-10-CM

## 2019-07-01 DIAGNOSIS — M5126 Other intervertebral disc displacement, lumbar region: Secondary | ICD-10-CM | POA: Diagnosis not present

## 2019-07-01 MED ORDER — IOPAMIDOL (ISOVUE-M 200) INJECTION 41%
15.0000 mL | Freq: Once | INTRAMUSCULAR | Status: AC
  Administered 2019-07-01: 15 mL via INTRATHECAL

## 2019-07-01 MED ORDER — DIAZEPAM 5 MG PO TABS
10.0000 mg | ORAL_TABLET | Freq: Once | ORAL | Status: AC
  Administered 2019-07-01: 10 mg via ORAL

## 2019-07-01 MED ORDER — DIAZEPAM 5 MG PO TABS: 5.0000 mg | ORAL_TABLET | Freq: Once | ORAL | Status: DC

## 2019-07-01 NOTE — Progress Notes (Signed)
Pt reports he has been off of his Effexor and Buspar for at least 48 hours.

## 2019-07-01 NOTE — Discharge Instructions (Signed)
Myelogram Discharge Instructions  1. Go home and rest quietly for the next 24 hours.  It is important to lie flat for the next 24 hours.  Get up only to go to the restroom.  You may lie in the bed or on a couch on your back, your stomach, your left side or your right side.  You may have one pillow under your head.  You may have pillows between your knees while you are on your side or under your knees while you are on your back.  2. DO NOT drive today.  Recline the seat as far back as it will go, while still wearing your seat belt, on the way home.  3. You may get up to go to the bathroom as needed.  You may sit up for 10 minutes to eat.  You may resume your normal diet and medications unless otherwise indicated.  Drink lots of extra fluids today and tomorrow.  4. The incidence of headache, nausea, or vomiting is about 5% (one in 20 patients).  If you develop a headache, lie flat and drink plenty of fluids until the headache goes away.  Caffeinated beverages may be helpful.  If you develop severe nausea and vomiting or a headache that does not go away with flat bed rest, call [phone].  5. You may resume normal activities after your 24 hours of bed rest is over; however, do not exert yourself strongly or do any heavy lifting tomorrow. If when you get up you have a headache when standing, go back to bed and force fluids for another 24 hours.  6. Call your physician for a follow-up appointment.  The results of your myelogram will be sent directly to your physician by the following day.  7. If you have any questions or if complications develop after you arrive home, please call [phone].  Discharge instructions have been explained to the patient.  The patient, or the person responsible for the patient, fully understands these instructions.  YOU MAY RESTART YOUR EFFEXOR AND BUSPAR TOMORROW 07/02/2019 AT 10:30AM.

## 2019-07-09 ENCOUNTER — Other Ambulatory Visit: Payer: Self-pay | Admitting: Family Medicine

## 2019-07-09 DIAGNOSIS — M48062 Spinal stenosis, lumbar region with neurogenic claudication: Secondary | ICD-10-CM | POA: Diagnosis not present

## 2019-07-09 DIAGNOSIS — F410 Panic disorder [episodic paroxysmal anxiety] without agoraphobia: Secondary | ICD-10-CM

## 2019-07-09 DIAGNOSIS — F430 Acute stress reaction: Secondary | ICD-10-CM

## 2019-07-09 NOTE — Telephone Encounter (Signed)
Requested medication (s) are due for refill today: yes  Requested medication (s) are on the active medication list: yes  Last refill:  06/22/2019  Future visit scheduled: yes  Notes to clinic: refill cannot be delegated    Requested Prescriptions  Pending Prescriptions Disp Refills   LORazepam (ATIVAN) 1 MG tablet [Pharmacy Med Name: LORAZEPAM 1MG  TABLETS] 90 tablet     Sig: TAKE 1 TABLET(1 MG) BY MOUTH EVERY 6 HOURS AS NEEDED FOR ANXIETY      Not Delegated - Psychiatry:  Anxiolytics/Hypnotics Failed - 07/09/2019  1:57 PM      Failed - This refill cannot be delegated      Failed - Urine Drug Screen completed in last 360 days.      Passed - Valid encounter within last 6 months    Recent Outpatient Visits           1 month ago Low back pain potentially associated with radiculopathy   [facility] Birdie Sons, MD   2 months ago Sciatica, unspecified laterality   [facility] Birdie Sons, MD   3 months ago Need for influenza vaccination   [facility] Birdie Sons, MD   6 months ago [place], Donald E, MD   10 months ago Essential (primary) hypertension   [place]ana, Kirstie Peri, MD       Future Appointments             In 1 month Fisher, Kirstie Peri, MD [facility], [place]

## 2019-07-10 NOTE — Telephone Encounter (Signed)
Next dispense due 07-15-2019

## 2019-07-14 ENCOUNTER — Other Ambulatory Visit: Payer: Self-pay | Admitting: Family Medicine

## 2019-07-15 ENCOUNTER — Encounter: Payer: Self-pay | Admitting: Family Medicine

## 2019-07-15 MED ORDER — HYDROCODONE-ACETAMINOPHEN 10-325 MG PO TABS: 1.0000 | ORAL_TABLET | Freq: Four times a day (QID) | ORAL | 0 refills | Status: DC | PRN

## 2019-07-29 ENCOUNTER — Telehealth: Payer: Self-pay

## 2019-07-29 ENCOUNTER — Encounter: Payer: Self-pay | Admitting: Family Medicine

## 2019-07-29 NOTE — Telephone Encounter (Signed)
I called patient. He wants to know what he should do about his medications if the Domenica Reamer goes into effect. Patient says the government may be shutting down and news reports have advised citizens to stock up on food and medications/ prescriptions for at least 30 days. Patient wants to know what he needs to do about his medications.

## 2019-08-01 ENCOUNTER — Other Ambulatory Visit: Payer: Self-pay | Admitting: Family Medicine

## 2019-08-02 MED ORDER — HYDROCODONE-ACETAMINOPHEN 10-325 MG PO TABS: 1.0000 | ORAL_TABLET | Freq: Four times a day (QID) | ORAL | 0 refills | Status: DC | PRN

## 2019-08-14 ENCOUNTER — Ambulatory Visit (INDEPENDENT_AMBULATORY_CARE_PROVIDER_SITE_OTHER): Payer: Medicare Other | Admitting: Family Medicine

## 2019-08-14 ENCOUNTER — Other Ambulatory Visit: Payer: Self-pay

## 2019-08-14 ENCOUNTER — Ambulatory Visit: Payer: Medicare Other | Admitting: Family Medicine

## 2019-08-14 VITALS — BP 140/82 | HR 87 | Temp 97.5°F | Wt 218.0 lb

## 2019-08-14 DIAGNOSIS — F419 Anxiety disorder, unspecified: Secondary | ICD-10-CM

## 2019-08-14 DIAGNOSIS — E782 Mixed hyperlipidemia: Secondary | ICD-10-CM | POA: Diagnosis not present

## 2019-08-14 DIAGNOSIS — I10 Essential (primary) hypertension: Secondary | ICD-10-CM

## 2019-08-14 NOTE — Progress Notes (Signed)
Patient: Dylan [name] Male    DOB: 1955/08/24   64 y.o.   MRN: [id number] Visit Date: 08/14/2019  Today's Provider: Lelon Huh, MD   Chief Complaint  Patient presents with  . Anxiety  . Hypertension   Subjective:     Anxiety Presents for follow-up visit. Symptoms include depressed mood, excessive worry, insomnia, nervous/anxious behavior, palpitations, panic and restlessness. Patient reports no chest pain, dizziness, irritability, muscle tension, shortness of breath or suicidal ideas. Symptoms occur constantly. The severity of symptoms is moderate. The quality of sleep is good (just not at the time he wants it to be.  He can't sleep at night.).    Hypertension This is a chronic problem. Associated symptoms include anxiety and palpitations. Pertinent negatives include no chest pain or shortness of breath. Agents associated with hypertension include NSAIDs. Compliance problems include medication side effects.     Patient states he has decreased his blood pressure medication due to side effects.  He states he is only taking 1/2 tablet of amlodipine twice a day.  He said that the whole pill gives him headaches, makes him dizzy and it makes him anxious.  Patient is not taking any of the antidepressants as he states he does not need them.  However, he did respond to having depressed mood.  Anxiety has been a little better, taking 2-3 lorazepam a day instead of 4. Stopped buspirone  About a month ago when he got a new prescription for lorazepam prior to MRI. Didn't feel like it was helping much. Continue on venlafaxine which he is tolerating well.   Allergies  Allergen Reactions  . Belsomra [Suvorexant] Other (See Comments)    Hallucinations  . Morphine And Related Hives and Nausea And Vomiting    Dry heaves and hives  . Duloxetine Other (See Comments)    Nervous  . Meloxicam Other (See Comments)    Stomach cramps  . Other Other (See Comments)    CBD oil - felt anxious      Current Outpatient Medications:  .  diphenhydramine-acetaminophen (TYLENOL PM) 25-500 MG TABS, Take 1 tablet by mouth at bedtime as needed (pain). Reported on 01/01/2016, Disp: , Rfl:  .  fluticasone (FLONASE) 50 MCG/ACT nasal spray, fluticasone propionate 50 mcg/actuation nasal spray,suspension, Disp: , Rfl:  .  HYDROcodone-acetaminophen (NORCO) 10-325 MG tablet, Take 1-2 tablets by mouth every 6 (six) hours as needed for severe pain., Disp: 160 tablet, Rfl: 0 .  LORazepam (ATIVAN) 1 MG tablet, TAKE 1 TABLET(1 MG) BY MOUTH EVERY 6 HOURS AS NEEDED FOR ANXIETY, Disp: 90 tablet, Rfl: 1 .  Melatonin 3 MG CAPS, Take 3 mg by mouth as needed. , Disp: , Rfl:  .  naproxen sodium (ALEVE) 220 MG tablet, Take 220 mg by mouth daily as needed., Disp: , Rfl:  .  omeprazole (PRILOSEC) 40 MG capsule, Take 1 capsule (40 mg total) by mouth daily., Disp: 90 capsule, Rfl: 4 .  simvastatin (ZOCOR) 40 MG tablet, Take 1 tablet (40 mg total) by mouth daily., Disp: 90 tablet, Rfl: 4 .  tadalafil (CIALIS) 20 MG tablet, Take 0.5-1 tablets (10-20 mg total) by mouth every other day as needed for erectile dysfunction., Disp: 30 tablet, Rfl: 1 .  amLODipine (NORVASC) 5 MG tablet, Take 1 tablet (5 mg total) by mouth every evening., Disp: , Rfl:  .  aspirin 81 MG tablet, Take 81 mg by mouth daily. Reported on 01/01/2016, Disp: , Rfl:  .  busPIRone (BUSPAR)  15 MG tablet, 1/2 tablet twice daily for 4 days, then 1/2 tablet in the morning and 1 at night for 4 days, then 1 tablet twice daily. (Patient not taking: Reported on 08/14/2019), Disp: 60 tablet, Rfl: 1 .  cyclobenzaprine (FLEXERIL) 10 MG tablet, TAKE 1 TABLET BY MOUTH 3  TIMES DAILY AS NEEDED FOR  MUSCLE SPASM(S), Disp: 90 tablet, Rfl: 4 .  diazepam (VALIUM) 5 MG tablet, Take one tablet one hour prior to procedure. May take second tablet one hour later if needed. (Patient not taking: Reported on 08/14/2019), Disp: 2 tablet, Rfl: 0 .  diclofenac (VOLTAREN) 50 MG EC tablet,  Take 1 tablet (50 mg total) by mouth 2 (two) times daily. Take with food (Patient not taking: Reported on 06/19/2019), Disp: 30 tablet, Rfl: 3 .  OVER THE COUNTER MEDICATION, CEREBRA 2 TABLETS DAILY, Disp: , Rfl:  .  TESTOSTERONE NA, Take 3 tablets by mouth daily. Nugenix, Disp: , Rfl:  .  venlafaxine XR (EFFEXOR-XR) 75 MG 24 hr capsule, Take 3 capsules (225 mg total) by mouth every morning. (Patient not taking: Reported on 08/14/2019), Disp: 270 capsule, Rfl: 4  Review of Systems  Constitutional: Negative for irritability.  Respiratory: Negative for shortness of breath.   Cardiovascular: Positive for palpitations. Negative for chest pain.  Neurological: Negative for dizziness.  Psychiatric/Behavioral: Negative for suicidal ideas. The patient is nervous/anxious and has insomnia.     Social History   Tobacco Use  . Smoking status: Never Smoker  . Smokeless tobacco: Never Used  Substance Use Topics  . Alcohol use: Not Currently        Objective:   BP (!) 160/80 (BP Location: Right Arm, Patient Position: Sitting, Cuff Size: Normal)   Pulse 87   Temp (!) 97.5 F (36.4 C) (Skin)   Wt 218 lb (98.9 kg)   SpO2 97%   BMI 31.28 kg/m  Vitals:   08/14/19 1057  BP: (!) 160/80  Pulse: 87  Temp: (!) 97.5 F (36.4 C)  TempSrc: Skin  SpO2: 97%  Weight: 218 lb (98.9 kg)  Body mass index is 31.28 kg/m.  Vitals:   08/14/19 1057 08/14/19 1115  BP: (!) 160/80 140/82  Pulse: 87   Temp: (!) 97.5 F (36.4 C)   TempSrc: Skin   SpO2: 97%   Weight: 218 lb (98.9 kg)     Physical Exam  General appearance: Overweight male, cooperative and in no acute distress Head: Normocephalic, without obvious abnormality, atraumatic Respiratory: Respirations even and unlabored, normal respiratory rate Extremities: All extremities are intact.  Skin: Skin color, texture, turgor normal. No rashes seen  Psych: Appropriate mood and affect. Neurologic: Mental status: Alert, oriented to person, place, and  time, thought content appropriate.  No results found for any visits on 08/14/19.     Assessment & Plan    1. Anxiety Doing better, is off of buspirone. Continues on venlafaxine and prn lorazepam.   2. Essential (primary) hypertension Fairly well controlled, but likely aggravated by worsening back pain. He is considering another back surgery but hasn't made up is mind. Advised he needs to go to pain clinic if he decides not to have surgery.   3. Hyperlipidemia, mixed He states he has not taken simvastatin consistently for awhile. He states he will start back on it now and will check lipids at then end of February.       [name], MD  [facility]

## 2019-08-14 NOTE — Patient Instructions (Signed)
.   Please review the attached list of medications and notify my office if there are any errors.   . You should take at least one naproxen every evening before going to bed. You could also take one during the day if you need   You are due for routine labs including your cholesterol level. Start back on the simvastatin and we will contact you at the end of February to get labs done.

## 2019-08-20 ENCOUNTER — Other Ambulatory Visit: Payer: Self-pay | Admitting: Family Medicine

## 2019-08-21 MED ORDER — HYDROCODONE-ACETAMINOPHEN 10-325 MG PO TABS: 1.0000 | ORAL_TABLET | Freq: Four times a day (QID) | ORAL | 0 refills | Status: DC | PRN

## 2019-08-29 ENCOUNTER — Other Ambulatory Visit: Payer: Self-pay | Admitting: Family Medicine

## 2019-08-29 DIAGNOSIS — F410 Panic disorder [episodic paroxysmal anxiety] without agoraphobia: Secondary | ICD-10-CM

## 2019-08-29 DIAGNOSIS — F430 Acute stress reaction: Secondary | ICD-10-CM

## 2019-08-29 NOTE — Telephone Encounter (Signed)
Requested medication (s) are due for refill today: yes  Requested medication (s) are on the active medication list:yes  Last refill: 07/13/19  #90 1 refill  Future visit scheduled  06/03/20  Notes to clinic: not delegated  Requested Prescriptions  Pending Prescriptions Disp Refills   LORazepam (ATIVAN) 1 MG tablet [Pharmacy Med Name: LORAZEPAM 1MG  TABLETS] 90 tablet     Sig: TAKE 1 TABLET BY MOUTH EVERY 6 HOURS AS NEEDED FOR ANXIETY      Not Delegated - Psychiatry:  Anxiolytics/Hypnotics Failed - 08/29/2019  2:35 PM      Failed - This refill cannot be delegated      Failed - Urine Drug Screen completed in last 360 days.      Passed - Valid encounter within last 6 months    Recent Outpatient Visits           2 weeks ago Anxiety   [facility], MD   3 months ago Low back pain potentially associated with radiculopathy   [facility], MD   4 months ago Sciatica, unspecified laterality   [facility], MD   5 months ago Need for influenza vaccination   [facility], MD   8 months ago Anxiety   [facility], MD

## 2019-08-30 NOTE — Telephone Encounter (Signed)
Patient called to check on the progress of refill on medication below. Please advise

## 2019-09-08 ENCOUNTER — Other Ambulatory Visit: Payer: Self-pay | Admitting: Family Medicine

## 2019-09-09 ENCOUNTER — Telehealth: Payer: Self-pay | Admitting: Family Medicine

## 2019-09-09 DIAGNOSIS — E782 Mixed hyperlipidemia: Secondary | ICD-10-CM

## 2019-09-09 MED ORDER — HYDROCODONE-ACETAMINOPHEN 10-325 MG PO TABS: 1.0000 | ORAL_TABLET | Freq: Four times a day (QID) | ORAL | 0 refills | Status: DC | PRN

## 2019-09-09 NOTE — Telephone Encounter (Signed)
Patient advised. Lab slip printed at front desk in [address].  

## 2019-09-09 NOTE — Telephone Encounter (Signed)
Please advise patient it is time to check cholesterol since being back on simvastatin. Needs to be fasting, please print and leave order at lab.  thanks

## 2019-09-13 ENCOUNTER — Ambulatory Visit: Payer: Self-pay

## 2019-09-13 DIAGNOSIS — E782 Mixed hyperlipidemia: Secondary | ICD-10-CM | POA: Diagnosis not present

## 2019-09-13 NOTE — Telephone Encounter (Signed)
Called patient who states he had blood drawn today at the office and about 4:40 this afternoon noticed a rash that has spread from his elbow up his arm and now has some on his face. He did not see the physician today  He has not started new medication. He describes the rash as red round smooth flat areas the size of silver dollars. He denies itching. No pain. No sore throat of stiff neck.  Per protocol he should be seen. [facility].  Care advice was read to patient. He verbalized understanding.  Reason for Disposition . Mild widespread rash  Answer Assessment - Initial Assessment Questions 1.) CALLER DIAGNOSIS: "What do you think is causing the rash?" (e.g., Chickenpox, Hives, Impetigo, Athlete's Foot, etc.)  2.) LOCATION:  "Is it widespread or localized?"  3.) NEW MEDICATIONS: "Are you taking any new medicine?"  rt inner arm above elbow and rt side of face No new medication or reason. Had blood draw today Never had reaction to tape  Answer Assessment - Initial Assessment Questions 1. APPEARANCE of RASH: "Describe the rash." (e.g., spots, blisters, raised areas, skin peeling, scaly)     Red rash 2. SIZE: "How big are the spots?" (e.g., tip of pen, eraser, coin; inches, centimeters)    Tip dollar bill size 3. LOCATION: "Where is the rash located?"     Rt  Arm at and above the elbo 4. COLOR: "What color is the rash?" (Note: It is difficult to assess rash color in people with darker-colored skin. When this situation occurs, simply ask the caller to describe what they see.)     red 5. ONSET: "When did the rash begin?"     Blood draw at noon rash at 4:40 today 6. FEVER: "Do you have a fever?" If so, ask: "What is your temperature, how was it measured, and when did it start?"    no 7. ITCHING: "Does the rash itch?" If so, ask: "How bad is the itch?" (Scale 1-10; or mild, moderate, severe)     no 8. CAUSE: "What do you think is causing the rash?"  unsure 9. MEDICATION FACTORS: "Have you started any new medications within the last 2 weeks?" (e.g., antibiotics)     no 10. OTHER SYMPTOMS: "Do you have any other symptoms?" (e.g., dizziness, headache, sore throat, joint pain)      no 11. PREGNANCY: "Is there any chance you are pregnant?" "When was your last menstrual period?"      N/A  Protocols used: RASH OR REDNESS - WIDESPREAD-A-AH, RASH - GUIDELINE SELECTION-A-AH

## 2019-09-14 LAB — LIPID PANEL
Chol/HDL Ratio: 3.5 ratio (ref 0.0–5.0)
Cholesterol, Total: 149 mg/dL (ref 100–199)
HDL: 43 mg/dL (ref >39)
LDL Chol Calc (NIH): 83 mg/dL (ref 0–99)
Triglycerides: 130 mg/dL (ref 0–149)
VLDL Cholesterol Cal: 23 mg/dL (ref 5–40)

## 2019-09-14 LAB — COMPREHENSIVE METABOLIC PANEL
ALT: 15 IU/L (ref 0–44)
AST: 19 IU/L (ref 0–40)
Albumin/Globulin Ratio: 1.9 (ref 1.2–2.2)
Albumin: 4.4 g/dL (ref 3.8–4.8)
Alkaline Phosphatase: 123 IU/L — ABNORMAL HIGH (ref 39–117)
BUN/Creatinine Ratio: 21 (ref 10–24)
BUN: 17 mg/dL (ref 8–27)
Bilirubin Total: 0.4 mg/dL (ref 0.0–1.2)
CO2: 22 mmol/L (ref 20–29)
Calcium: 9.5 mg/dL (ref 8.6–10.2)
Chloride: 102 mmol/L (ref 96–106)
Creatinine, Ser: 0.82 mg/dL (ref 0.76–1.27)
GFR calc Af Amer: 109 mL/min/{1.73_m2} (ref >59)
GFR calc non Af Amer: 94 mL/min/{1.73_m2} (ref >59)
Globulin, Total: 2.3 g/dL (ref 1.5–4.5)
Glucose: 98 mg/dL (ref 65–99)
Potassium: 4.6 mmol/L (ref 3.5–5.2)
Sodium: 139 mmol/L (ref 134–144)
Total Protein: 6.7 g/dL (ref 6.0–8.5)

## 2019-09-27 ENCOUNTER — Other Ambulatory Visit: Payer: Self-pay | Admitting: Family Medicine

## 2019-09-27 MED ORDER — HYDROCODONE-ACETAMINOPHEN 10-325 MG PO TABS: 1.0000 | ORAL_TABLET | Freq: Four times a day (QID) | ORAL | 0 refills | Status: DC | PRN

## 2019-10-15 ENCOUNTER — Other Ambulatory Visit: Payer: Self-pay | Admitting: Family Medicine

## 2019-10-15 DIAGNOSIS — S42001A Fracture of unspecified part of right clavicle, initial encounter for closed fracture: Secondary | ICD-10-CM

## 2019-10-15 NOTE — Telephone Encounter (Signed)
Requested medication (s) are due for refill today: yes  Requested medication (s) are on the active medication list: no  Last refill:  11/09/16  Future visit scheduled: no  Notes to clinic:  expired med/ medication not delegated to NT to refill   Requested Prescriptions  Pending Prescriptions Disp Refills   naproxen (NAPROSYN) 500 MG tablet [Pharmacy Med Name: NAPROXEN  500MG   TAB] 180 tablet 3    Sig: TAKE 1 TABLET BY MOUTH TWO  TIMES DAILY WITH A MEAL      Analgesics:  NSAIDS Failed - 10/15/2019 11:07 AM      Failed - HGB in normal range and within 360 days    Hemoglobin  Date Value Ref Range Status  04/21/2016 15.0 13.0 - 17.0 g/dL Final   HGB  Date Value Ref Range Status  08/25/2014 13.7 13.0 - 18.0 g/dL Final          Passed - Cr in normal range and within 360 days    Creat  Date Value Ref Range Status  04/25/2017 0.87 0.70 - 1.25 mg/dL Final    Comment:    For patients >3 years of age, the reference limit for Creatinine is approximately 13% higher for people identified as African-American. .    Creatinine, Ser  Date Value Ref Range Status  09/13/2019 0.82 0.76 - 1.27 mg/dL Final          Passed - Patient is not pregnant      Passed - Valid encounter within last 12 months    Recent Outpatient Visits           2 months ago Anxiety   [facility], MD   4 months ago Low back pain potentially associated with radiculopathy   [facility], MD   5 months ago Sciatica, unspecified laterality   [facility], MD   7 months ago Need for influenza vaccination   [facility], MD   9 months ago Anxiety   [facility], MD

## 2019-10-16 ENCOUNTER — Other Ambulatory Visit: Payer: Self-pay | Admitting: Family Medicine

## 2019-10-16 DIAGNOSIS — M5126 Other intervertebral disc displacement, lumbar region: Secondary | ICD-10-CM

## 2019-10-16 NOTE — Telephone Encounter (Signed)
20 day supply disepensed 09-29-2019 Ok to send refill 10-18-2019

## 2019-10-18 MED ORDER — HYDROCODONE-ACETAMINOPHEN 10-325 MG PO TABS: 1.0000 | ORAL_TABLET | Freq: Four times a day (QID) | ORAL | 0 refills | Status: DC | PRN

## 2019-11-05 ENCOUNTER — Other Ambulatory Visit: Payer: Self-pay | Admitting: Family Medicine

## 2019-11-05 DIAGNOSIS — M5126 Other intervertebral disc displacement, lumbar region: Secondary | ICD-10-CM

## 2019-11-05 NOTE — Telephone Encounter (Signed)
Dispensed 20 day supply 4-3=2021

## 2019-11-07 MED ORDER — HYDROCODONE-ACETAMINOPHEN 10-325 MG PO TABS: 1.0000 | ORAL_TABLET | Freq: Four times a day (QID) | ORAL | 0 refills | Status: DC | PRN

## 2019-11-07 NOTE — Telephone Encounter (Signed)
Pt stated he is completely out of his medication and would like an update on the refill request. Pt requests call back

## 2019-11-25 ENCOUNTER — Other Ambulatory Visit: Payer: Self-pay | Admitting: Family Medicine

## 2019-11-25 DIAGNOSIS — M5126 Other intervertebral disc displacement, lumbar region: Secondary | ICD-10-CM

## 2019-11-26 MED ORDER — HYDROCODONE-ACETAMINOPHEN 10-325 MG PO TABS: 1.0000 | ORAL_TABLET | Freq: Four times a day (QID) | ORAL | 0 refills | Status: DC | PRN

## 2019-11-30 ENCOUNTER — Other Ambulatory Visit: Payer: Self-pay | Admitting: Family Medicine

## 2019-11-30 DIAGNOSIS — I10 Essential (primary) hypertension: Secondary | ICD-10-CM

## 2019-11-30 NOTE — Telephone Encounter (Signed)
Requested Prescriptions  Pending Prescriptions Disp Refills  . amLODipine (NORVASC) 5 MG tablet [Pharmacy Med Name: AMLODIPINE BESYLATE 5MG  TABLETS] 90 tablet 0    Sig: TAKE 1 TABLET(5 MG) BY MOUTH EVERY EVENING     Cardiovascular:  Calcium Channel Blockers Failed - 11/30/2019 12:13 PM      Failed - Last BP in normal range    BP Readings from Last 1 Encounters:  08/14/19 140/82         Passed - Valid encounter within last 6 months    Recent Outpatient Visits          3 months ago Anxiety   [facility], MD   6 months ago Low back pain potentially associated with radiculopathy   [facility], MD   7 months ago Sciatica, unspecified laterality   [facility], MD   8 months ago Need for influenza vaccination   [facility], MD   11 months ago Anxiety   [facility], Sherrie Mustache, MD

## 2019-12-01 ENCOUNTER — Other Ambulatory Visit: Payer: Self-pay | Admitting: Family Medicine

## 2019-12-01 DIAGNOSIS — F430 Acute stress reaction: Secondary | ICD-10-CM

## 2019-12-01 DIAGNOSIS — F410 Panic disorder [episodic paroxysmal anxiety] without agoraphobia: Secondary | ICD-10-CM

## 2019-12-01 NOTE — Telephone Encounter (Signed)
Requested medication (s) are due for refill today: yes  Requested medication (s) are on the active medication list: yes  Last refill:  08/31/19  Future visit scheduled: no  Notes to clinic:  med not delegated to NT- Called pharmacy and no RF are left   Requested Prescriptions  Pending Prescriptions Disp Refills   LORazepam (ATIVAN) 1 MG tablet [Pharmacy Med Name: LORAZEPAM 1MG  TABLETS] 90 tablet     Sig: TAKE 1 TABLET BY MOUTH EVERY 6 HOURS AS NEEDED FOR ANXIETY      Not Delegated - Psychiatry:  Anxiolytics/Hypnotics Failed - 12/01/2019  4:56 PM      Failed - This refill cannot be delegated      Failed - Urine Drug Screen completed in last 360 days.      Passed - Valid encounter within last 6 months    Recent Outpatient Visits           3 months ago Anxiety   [facility], MD   6 months ago Low back pain potentially associated with radiculopathy   [facility], MD   7 months ago Sciatica, unspecified laterality   [facility], MD   8 months ago Need for influenza vaccination   [facility], MD   11 months ago Anxiety   [facility], MD

## 2019-12-12 ENCOUNTER — Other Ambulatory Visit: Payer: Self-pay | Admitting: Family Medicine

## 2019-12-12 DIAGNOSIS — K219 Gastro-esophageal reflux disease without esophagitis: Secondary | ICD-10-CM

## 2019-12-12 DIAGNOSIS — M5126 Other intervertebral disc displacement, lumbar region: Secondary | ICD-10-CM

## 2019-12-12 NOTE — Telephone Encounter (Signed)
PT need a refill  HYDROcodone-acetaminophen (NORCO) 10-325 MG tablet [[id number]]  [id number] - [place], [place] - [address] AT [facility] OF [address] [place]  [address] [place] [address]  Phone: [phone] Fax: [phone]

## 2019-12-12 NOTE — Telephone Encounter (Signed)
Requested medication (s) are due for refill today: no  Requested medication (s) are on the active medication list: yes  Last refill:  11/26/2019  Future visit scheduled: no  Notes to clinic:  this refill cannot be delegated    Requested Prescriptions  Pending Prescriptions Disp Refills   HYDROcodone-acetaminophen (NORCO) 10-325 MG tablet 160 tablet 0    Sig: Take 1-2 tablets by mouth every 6 (six) hours as needed for severe pain.      Not Delegated - Analgesics:  Opioid Agonist Combinations Failed - 12/12/2019  2:24 PM      Failed - This refill cannot be delegated      Failed - Urine Drug Screen completed in last 360 days.      Passed - Valid encounter within last 6 months    Recent Outpatient Visits           4 months ago Anxiety   [facility] Malva Limes, MD   6 months ago Low back pain potentially associated with radiculopathy   [facility] Malva Limes, MD   7 months ago Sciatica, unspecified laterality   [facility] Malva Limes, MD   9 months ago Need for influenza vaccination   [facility] Malva Limes, MD   11 months ago Anxiety   [facility] Malva Limes, MD

## 2019-12-12 NOTE — Telephone Encounter (Signed)
Last dispensed 20 days supply 11-27-2019

## 2019-12-14 MED ORDER — HYDROCODONE-ACETAMINOPHEN 10-325 MG PO TABS: 1.0000 | ORAL_TABLET | Freq: Four times a day (QID) | ORAL | 0 refills | Status: DC | PRN

## 2019-12-24 ENCOUNTER — Other Ambulatory Visit: Payer: Self-pay | Admitting: Family Medicine

## 2019-12-24 DIAGNOSIS — N529 Male erectile dysfunction, unspecified: Secondary | ICD-10-CM

## 2020-01-01 ENCOUNTER — Other Ambulatory Visit: Payer: Self-pay | Admitting: Family Medicine

## 2020-01-01 DIAGNOSIS — M5126 Other intervertebral disc displacement, lumbar region: Secondary | ICD-10-CM

## 2020-01-01 NOTE — Telephone Encounter (Signed)
Day supply dispense 12-15-2019

## 2020-01-03 MED ORDER — HYDROCODONE-ACETAMINOPHEN 10-325 MG PO TABS: 1.0000 | ORAL_TABLET | Freq: Four times a day (QID) | ORAL | 0 refills | Status: DC | PRN

## 2020-01-03 NOTE — Telephone Encounter (Signed)
PT need a refill  HYDROcodone-acetaminophen (NORCO) 10-325 MG tablet [[id number]]  [facility] #[id number] - [place], [place] - [address] AT [facility] OF [address] [place]  [address] [place] [address]  Phone: [phone] Fax: [phone]

## 2020-01-21 ENCOUNTER — Other Ambulatory Visit: Payer: Self-pay | Admitting: Family Medicine

## 2020-01-21 DIAGNOSIS — M5126 Other intervertebral disc displacement, lumbar region: Secondary | ICD-10-CM

## 2020-01-22 MED ORDER — HYDROCODONE-ACETAMINOPHEN 10-325 MG PO TABS: 1.0000 | ORAL_TABLET | Freq: Four times a day (QID) | ORAL | 0 refills | Status: DC | PRN

## 2020-01-22 NOTE — Telephone Encounter (Signed)
Patient calling to check status of this refill.

## 2020-02-11 ENCOUNTER — Other Ambulatory Visit: Payer: Self-pay | Admitting: Family Medicine

## 2020-02-11 DIAGNOSIS — M5126 Other intervertebral disc displacement, lumbar region: Secondary | ICD-10-CM

## 2020-02-11 MED ORDER — HYDROCODONE-ACETAMINOPHEN 10-325 MG PO TABS: 1.0000 | ORAL_TABLET | Freq: Four times a day (QID) | ORAL | 0 refills | Status: DC | PRN

## 2020-02-26 ENCOUNTER — Other Ambulatory Visit: Payer: Self-pay | Admitting: Family Medicine

## 2020-02-26 DIAGNOSIS — F410 Panic disorder [episodic paroxysmal anxiety] without agoraphobia: Secondary | ICD-10-CM

## 2020-02-26 DIAGNOSIS — F430 Acute stress reaction: Secondary | ICD-10-CM

## 2020-02-28 ENCOUNTER — Other Ambulatory Visit: Payer: Self-pay | Admitting: Family Medicine

## 2020-02-28 DIAGNOSIS — M5126 Other intervertebral disc displacement, lumbar region: Secondary | ICD-10-CM

## 2020-02-28 DIAGNOSIS — I10 Essential (primary) hypertension: Secondary | ICD-10-CM

## 2020-03-02 ENCOUNTER — Telehealth: Payer: Self-pay | Admitting: Family Medicine

## 2020-03-02 DIAGNOSIS — M5126 Other intervertebral disc displacement, lumbar region: Secondary | ICD-10-CM

## 2020-03-02 MED ORDER — HYDROCODONE-ACETAMINOPHEN 10-325 MG PO TABS: 1.0000 | ORAL_TABLET | Freq: Four times a day (QID) | ORAL | 0 refills | Status: DC | PRN

## 2020-03-02 NOTE — Telephone Encounter (Signed)
Medication: HYDROcodone-acetaminophen (NORCO) 10-325 MG tablet [[id number]]   Has the patient contacted their pharmacy? YES  (Agent: If no, request that the patient contact the pharmacy for the refill.) (Agent: If yes, when and what did the pharmacy advise?)  Preferred Pharmacy (with phone number or street name): [facility][id number] [place], [place] - [address] AT [facility] OF [address] [facility]  Phone:  [phone] Fax:  [phone]     Agent: Please be advised that RX refills may take up to 3 business days. We ask that you follow-up with your pharmacy.

## 2020-03-02 NOTE — Telephone Encounter (Signed)
Requested medication (s) are due for refill today: no  Requested medication (s) are on the active medication list: yes  Last refill:  02/11/2020  Future visit scheduled: yes  Notes to clinic:  this refill cannot be delegated    Requested Prescriptions  Pending Prescriptions Disp Refills   HYDROcodone-acetaminophen (NORCO) 10-325 MG tablet 160 tablet 0    Sig: Take 1-2 tablets by mouth every 6 (six) hours as needed for severe pain.      Not Delegated - Analgesics:  Opioid Agonist Combinations Failed - 03/02/2020 12:26 PM      Failed - This refill cannot be delegated      Failed - Urine Drug Screen completed in last 360 days.      Failed - Valid encounter within last 6 months    Recent Outpatient Visits           6 months ago Anxiety   [facility] Malva Limes, MD   9 months ago Low back pain potentially associated with radiculopathy   [facility] Malva Limes, MD   10 months ago Sciatica, unspecified laterality   [facility] Malva Limes, MD   11 months ago Need for influenza vaccination   [facility] Malva Limes, MD   1 year ago Anxiety   [facility] Malva Limes, MD

## 2020-03-18 ENCOUNTER — Other Ambulatory Visit: Payer: Self-pay | Admitting: Family Medicine

## 2020-03-18 DIAGNOSIS — M5126 Other intervertebral disc displacement, lumbar region: Secondary | ICD-10-CM

## 2020-03-19 NOTE — Telephone Encounter (Signed)
20 days dispensed 03-02-2020

## 2020-03-20 MED ORDER — HYDROCODONE-ACETAMINOPHEN 10-325 MG PO TABS: 1.0000 | ORAL_TABLET | Freq: Four times a day (QID) | ORAL | 0 refills | Status: DC | PRN

## 2020-03-24 ENCOUNTER — Other Ambulatory Visit: Payer: Self-pay | Admitting: Family Medicine

## 2020-03-24 DIAGNOSIS — N529 Male erectile dysfunction, unspecified: Secondary | ICD-10-CM

## 2020-04-07 ENCOUNTER — Other Ambulatory Visit: Payer: Self-pay | Admitting: Family Medicine

## 2020-04-07 DIAGNOSIS — M5126 Other intervertebral disc displacement, lumbar region: Secondary | ICD-10-CM

## 2020-04-07 NOTE — Telephone Encounter (Signed)
Requested medication (s) are due for refill today: yes   Requested medication (s) are on the active medication list: yes  Last refill:  03/20/20 #160 0 refills   Future visit scheduled: no  Notes to clinic:  not delegated per protocol     Requested Prescriptions  Pending Prescriptions Disp Refills   HYDROcodone-acetaminophen (NORCO) 10-325 MG tablet 160 tablet 0    Sig: Take 1-2 tablets by mouth every 6 (six) hours as needed for severe pain.      Not Delegated - Analgesics:  Opioid Agonist Combinations Failed - 04/07/2020  4:34 PM      Failed - This refill cannot be delegated      Failed - Urine Drug Screen completed in last 360 days.      Failed - Valid encounter within last 6 months    Recent Outpatient Visits           7 months ago Anxiety   [facility] Malva Limes, MD   10 months ago Low back pain potentially associated with radiculopathy   [facility] Malva Limes, MD   11 months ago Sciatica, unspecified laterality   [facility] Malva Limes, MD   1 year ago Need for influenza vaccination   [facility] Malva Limes, MD   1 year ago Anxiety   [facility] Malva Limes, MD

## 2020-04-07 NOTE — Telephone Encounter (Signed)
Copied from CRM [id number]. Topic: Quick Communication - Rx Refill/Question >> Apr 07, 2020  4:30 PM Jaquita Rector A wrote: Medication: HYDROcodone-acetaminophen [facility]) 10-325 MG tablet  Per patient he will be out on 04/12/20  Has the patient contacted their pharmacy? Yes.   (Agent: If no, request that the patient contact the pharmacy for the refill.) (Agent: If yes, when and what did the pharmacy advise?)  Preferred Pharmacy (with phone number or street name): [facility] #[id number] [place], [place] - [address] AT [facility] OF [address] [facility]  Phone:  [phone] Fax:  [phone]     Agent: Please be advised that RX refills may take up to 3 business days. We ask that you follow-up with your pharmacy.

## 2020-04-09 ENCOUNTER — Telehealth: Payer: Self-pay | Admitting: *Deleted

## 2020-04-09 NOTE — Telephone Encounter (Signed)
Patient calls after taking store bought rapid Covid test that is positive. He began symptoms on 04/05/20 including fatigue, low grade fever and coughing up phlegm. Having intermittent lightheadedness and nausea today. Sounds stressed and scared about having Covid. Provided support and reassurance to call back if needed.  Discussed while staying home and away from others to monitor his breathing and with any changes or concerns call the doctor or seek treatment at the ED/UC with a mask on. Encouraged lots of fluids, wash hands frequently and disinfect common areas often. Wear a mask if you must be in the same room as your son. Discussed isolation end time and must not have fever and worsening respiratory symptoms at the end of his isolation period. If your lightheadedness and nausea does not improve call back.

## 2020-04-10 ENCOUNTER — Other Ambulatory Visit: Payer: Self-pay | Admitting: Family Medicine

## 2020-04-10 ENCOUNTER — Ambulatory Visit: Payer: Self-pay | Admitting: Family Medicine

## 2020-04-10 DIAGNOSIS — F430 Acute stress reaction: Secondary | ICD-10-CM

## 2020-04-10 DIAGNOSIS — F410 Panic disorder [episodic paroxysmal anxiety] without agoraphobia: Secondary | ICD-10-CM

## 2020-04-10 DIAGNOSIS — M5126 Other intervertebral disc displacement, lumbar region: Secondary | ICD-10-CM

## 2020-04-10 NOTE — Telephone Encounter (Signed)
Copied from CRM [id number]. Topic: Quick Communication - Rx Refill/Question >> Apr 10, 2020  4:25 PM Marylen Ponto wrote: Medication: HYDROcodone-acetaminophen (NORCO) 10-325 MG tablet  Has the patient contacted their pharmacy? no  Preferred Pharmacy (with phone number or street name): [facility] #[id number] [place], [place] - [address] AT [facility] OF [address] [facility] Phone: [phone]  Fax: [phone]  Agent: Please be advised that RX refills may take up to 3 business days. We ask that you follow-up with your pharmacy.

## 2020-04-10 NOTE — Telephone Encounter (Signed)
Returned call to pt.  Reported he has taken 2 Home COVID tests, and both are positive.  Reported onset of symptoms 9/21 with fatigue, cough, and hot and cold chills.  Also c/o muscle aching.  Reported coughing up "yellow" phlegm.  Stated temp. Has been 98.4-99.0 degrees.  C/o loss of appetite and decreased taste.  Denied shortness of breath or chest discomfort.  Stated his cough has become less frequent.  Advised to monitor temperature at regular intervals and take Tylenol or Ibuprofen for fever and body aches.  Advised to closely monitor for increased chest congestion, shortness of breath, and chest heaviness; advised to go to ER if those symptoms occur.  Pt. Stated he needed to lay down, and did not want to talk any longer.  Advised to call back if further questions, or go to ER if worsening symptoms.  Verb. Understanding.     Reason for Disposition . [1] COVID-19 diagnosed by positive lab test AND [2] mild symptoms (e.g., cough, fever, others) AND [3] no complications or SOB  Answer Assessment - Initial Assessment Questions 1. COVID-19 DIAGNOSIS: "Who made your Coronavirus (COVID-19) diagnosis?" "Was it confirmed by a positive lab test?" If not diagnosed by a HCP, ask "Are there lots of cases (community spread) where you live?" (See public health department website, if unsure)     Took home COVID test x 2 and was positive  2. COVID-19 EXPOSURE: "Was there any known exposure to COVID before the symptoms began?" CDC Definition of close contact: within 6 feet (2 meters) for a total of 15 minutes or more over a 24-hour period.       3. ONSET: "When did the COVID-19 symptoms start?"      04/07/20 4. WORST SYMPTOM: "What is your worst symptom?" (e.g., cough, fever, shortness of breath, muscle aches)     Fatigue, hot and cold sweats, poor appetite 5. COUGH: "Do you have a cough?" If Yes, ask: "How bad is the cough?"       Yes; coughing is intermittent ; feels like cough is less freq.  6. FEVER: "Do you  have a fever?" If Yes, ask: "What is your temperature, how was it measured, and when did it start?"     98.6 last night.  7. RESPIRATORY STATUS: "Describe your breathing?" (e.g., shortness of breath, wheezing, unable to speak)      Denied shortness of breath or wheeze 8. BETTER-SAME-WORSE: "Are you getting better, staying the same or gettin*g worse compared to yesterday?"  If getting worse, ask, "In what way?"     Feels symptoms have stayed the same over past few days  9. HIGH RISK DISEASE: "Do you have any chronic medical problems?" (e.g., asthma, heart or lung disease, weak immune system, obesity, etc.)     Denied COPD / asthma/ or heart disease  10. PREGNANCY: "Is there any chance you are pregnant?" "When was your last menstrual period?"       n/a 11. OTHER SYMPTOMS: "Do you have any other symptoms?"  (e.g., chills, fatigue, headache, loss of smell or taste, muscle pain, sore throat; new loss of smell or taste especially support the diagnosis of COVID-19)       Fatigue, chills, muscle aching, cough with yellow phlegm  Protocols used: CORONAVIRUS (COVID-19) DIAGNOSED OR SUSPECTED-A-AH Message from [facility] sent at 04/10/2020 4:33 PM EDT  Pt requested to speak with a nurse regarding positive Covid test results. Pt stated this is second test that he has taken with positive results.  Pt requests call back. Cb# [phone]

## 2020-04-11 MED ORDER — HYDROCODONE-ACETAMINOPHEN 10-325 MG PO TABS: 1.0000 | ORAL_TABLET | Freq: Four times a day (QID) | ORAL | 0 refills | Status: DC | PRN

## 2020-04-11 NOTE — Telephone Encounter (Signed)
Requested medication (s) are due for refill today: yes  Requested medication (s) are on the active medication list: yes  Last refill:  03/20/20  Future visit scheduled: yes  Notes to clinic:  med not delegated to NT to RF   Requested Prescriptions  Pending Prescriptions Disp Refills   HYDROcodone-acetaminophen (NORCO) 10-325 MG tablet 160 tablet 0    Sig: Take 1-2 tablets by mouth every 6 (six) hours as needed for severe pain.      Not Delegated - Analgesics:  Opioid Agonist Combinations Failed - 04/11/2020  7:11 AM      Failed - This refill cannot be delegated      Failed - Urine Drug Screen completed in last 360 days.      Failed - Valid encounter within last 6 months    Recent Outpatient Visits           8 months ago Anxiety   [facility] Malva Limes, MD   10 months ago Low back pain potentially associated with radiculopathy   [facility] Malva Limes, MD   11 months ago Sciatica, unspecified laterality   [facility] Malva Limes, MD   1 year ago Need for influenza vaccination   [facility] Malva Limes, MD   1 year ago Anxiety   [facility] Malva Limes, MD

## 2020-04-11 NOTE — Telephone Encounter (Signed)
Requested  medications are  due for refill today yes  Requested medications are on the active medication list yes  Last refill 9/3  Last visit 07/2019  Future visit scheduled 06/03/2020  Notes to clinic Not Delegated.

## 2020-04-13 ENCOUNTER — Encounter: Payer: Self-pay | Admitting: Family Medicine

## 2020-04-13 NOTE — Telephone Encounter (Signed)
LORazepam (ATIVAN) 1 MG tablet Medication Date: 02/26/2020 Department: [facility] Ordering/Authorizing: Malva Limes   Pt is really needing this med called in Friday. He has tested positive for covid and does not need anymore stress he states. In re to Covid pt says he is handling the symptoms for now and told him to make sure and FU if he gets any worse.

## 2020-04-13 NOTE — Telephone Encounter (Signed)
please advise patient to call Covid Infusion Hotline at [phone]

## 2020-04-13 NOTE — Telephone Encounter (Signed)
Patient requested that number be sent to him through [place] message.

## 2020-04-14 ENCOUNTER — Telehealth: Payer: Self-pay

## 2020-04-14 ENCOUNTER — Ambulatory Visit: Payer: Self-pay

## 2020-04-14 DIAGNOSIS — F410 Panic disorder [episodic paroxysmal anxiety] without agoraphobia: Secondary | ICD-10-CM

## 2020-04-14 DIAGNOSIS — F430 Acute stress reaction: Secondary | ICD-10-CM

## 2020-04-14 NOTE — Telephone Encounter (Signed)
Patient's son stating father is out of medication and needs refill. Please advise

## 2020-04-14 NOTE — Telephone Encounter (Signed)
Patient is calling that the pharmacy will not release his medication until the 09/30 and that he is completely out and if Dr.Fisher nurse can call the pharmacy and tell them that is ok to give it to him before 30th. LORazepam (ATIVAN) 1 MG tablet

## 2020-04-14 NOTE — Telephone Encounter (Signed)
Patient called stating that Dr Sherrie Mustache has filled his Rx for Lorazepam but pharmacy will not release it to him until the 30th.  He states they say he received a refill on the 7th. He is completely out. He states he has COVID-19 and is isolated. He states that his symptoms are improving. He still has fever off and on. Ranging 100.1. He treats his fever when present with Ibuprofen. He has a productive cough that is improving. He states he has no breathing problems. Call placed to office and patient is aware that staff will contact him about his refill. Mr Borowiak verbalized understanding of all information.  Reason for Disposition . [1] Prescription refill request for ESSENTIAL medicine (i.e., likelihood of harm to patient if not taken) AND [2] triager unable to refill per department policy  Answer Assessment - Initial Assessment Questions 1. DRUG NAME: "What medicine do you need to have refilled?"     lorazapam 2. REFILLS REMAINING: "How many refills are remaining?" (Note: The label on the medicine or pill bottle will show how many refills are remaining. If there are no refills remaining, then a renewal may be needed.)    Has been refilled  But pharmacy will not release 3. EXPIRATION DATE: "What is the expiration date?" (Note: The label states when the prescription will expire, and thus can no longer be refilled.)     Will release on 30th 4. PRESCRIBING HCP: "Who prescribed it?" Reason: If prescribed by specialist, call should be referred to that group.    Fisher 5. SYMPTOMS: "Do you have any symptoms?"     anxious 6. PREGNANCY: "Is there any chance that you are pregnant?" "When was your last menstrual period?"    N/A  Protocols used: MEDICATION REFILL AND RENEWAL CALL-A-AH

## 2020-04-14 NOTE — Telephone Encounter (Signed)
Ok to dispense refill now, please advise pharmacy.

## 2020-04-14 NOTE — Telephone Encounter (Signed)
Pt is now requesting to have his refill sent to this pharmacy  [phone] [place], [place] - [address]  [address] [place] [address]  Phone: [phone] Fax: [phone]

## 2020-04-14 NOTE — Telephone Encounter (Signed)
Spoke to patient's pharmacy and she said that they have a note in the patient's file that they can not dispense his medication early and also said that they have advised [place] of this previously so she said that we could send it to another pharmacy.

## 2020-04-14 NOTE — Telephone Encounter (Signed)
Pt's son called and is requesting that Rx get sent to pharmacy listed below. Please advise [facility] #[id number] [place], [place] - [address] AT [facility] OF [address] [place]  [address] [place] [address]  Phone: [phone] Fax: [phone]

## 2020-04-15 MED ORDER — LORAZEPAM 1 MG PO TABS: 1.0000 mg | ORAL_TABLET | Freq: Three times a day (TID) | ORAL | 0 refills | Status: DC | PRN

## 2020-04-20 ENCOUNTER — Other Ambulatory Visit: Payer: Self-pay | Admitting: Family Medicine

## 2020-04-20 DIAGNOSIS — F430 Acute stress reaction: Secondary | ICD-10-CM

## 2020-04-20 DIAGNOSIS — F410 Panic disorder [episodic paroxysmal anxiety] without agoraphobia: Secondary | ICD-10-CM

## 2020-04-20 NOTE — Telephone Encounter (Signed)
Requested medications are due for refill today?    Yes - This medication refill cannot be delegated.    Requested medications are on active medication list? Yes  Last Refill:  04/15/2020  # 21 with no refill   Future visit scheduled?  No   Notes to Clinic:  Last office visit was 8 months ago.  This medication refill cannot be delegated.

## 2020-04-22 ENCOUNTER — Telehealth: Payer: Self-pay

## 2020-04-22 NOTE — Telephone Encounter (Signed)
Have sent refill. He is overdue for follow up visit. Need to schedule follow up in about 3 weeks since he is getting over covid. Needs to limit lorazepam to no more than 3 a day.

## 2020-04-22 NOTE — Telephone Encounter (Signed)
Copied from CRM [id number]. Topic: Appointment Scheduling - Scheduling Inquiry for Clinic >> Apr 22, 2020 11:51 AM Randol Kern wrote: Reason for CRM: Pt would like to be scheduled for Covid 19 testing, please advise  Best contact: [phone] >> Apr 22, 2020  3:04 PM Leafy Ro wrote: Pt also would like flu shot. I did not sch flu shot pending covid test >> Apr 22, 2020  2:49 PM Leafy Ro wrote: Pt is calling checking on covid test

## 2020-04-22 NOTE — Telephone Encounter (Signed)
Pt is calling checking on the status of lorazepam

## 2020-04-23 ENCOUNTER — Other Ambulatory Visit: Payer: Self-pay | Admitting: Family Medicine

## 2020-04-23 DIAGNOSIS — M545 Low back pain, unspecified: Secondary | ICD-10-CM

## 2020-04-23 NOTE — Telephone Encounter (Signed)
Medication: clobenzaprine (FLEXERIL) 10 MG tablet [[id number]]   Has the patient contacted their pharmacy? YES (Agent: If no, request that the patient contact the pharmacy for the refill.) (Agent: If yes, when and what did the pharmacy advise?)  Preferred Pharmacy (with phone number or street name): [id number] [place], [place] - [address] [place] [address] Phone: [phone] Fax: [phone] Hours: Not open 24 hours    Agent: Please be advised that RX refills may take up to 3 business days. We ask that you follow-up with your pharmacy.

## 2020-04-23 NOTE — Telephone Encounter (Signed)
Requested medication (s) are due for refill today: no  Requested medication (s) are on the active medication list: yes   Future visit scheduled: yes  Notes to clinic:  This refill cannot be delegated    Requested Prescriptions  Pending Prescriptions Disp Refills   cyclobenzaprine (FLEXERIL) 10 MG tablet 90 tablet 4      Not Delegated - Analgesics:  Muscle Relaxants Failed - 04/23/2020  5:17 PM      Failed - This refill cannot be delegated      Failed - Valid encounter within last 6 months    Recent Outpatient Visits           8 months ago Anxiety   [facility] Malva Limes, MD   11 months ago Low back pain potentially associated with radiculopathy   [facility] Malva Limes, MD   11 months ago Sciatica, unspecified laterality   [facility] Malva Limes, MD   1 year ago Need for influenza vaccination   [facility] Malva Limes, MD   1 year ago Anxiety   [facility] Malva Limes, MD

## 2020-04-24 ENCOUNTER — Telehealth: Payer: Medicare Other | Admitting: Family Medicine

## 2020-04-24 DIAGNOSIS — Z03818 Encounter for observation for suspected exposure to other biological agents ruled out: Secondary | ICD-10-CM | POA: Diagnosis not present

## 2020-04-24 MED ORDER — CYCLOBENZAPRINE HCL 10 MG PO TABS: ORAL_TABLET | ORAL | 4 refills | Status: DC

## 2020-04-24 NOTE — Progress Notes (Deleted)
MyChart Video Visit    Virtual Visit via Video Note   This visit type was conducted due to national recommendations for restrictions regarding the COVID-19 Pandemic (e.g. social distancing) in an effort to limit this patient's exposure and mitigate transmission in our community. This patient is at least at moderate risk for complications without adequate follow up. This format is felt to be most appropriate for this patient at this time. Physical exam was limited by quality of the video and audio technology used for the visit.   Patient location: *** Provider location: ***  I discussed the limitations of evaluation and management by telemedicine and the availability of in person appointments. The patient expressed understanding and agreed to proceed.  Patient: Dylan [name]   DOB: 1956/04/12   64 y.o. Male  MRN: [id number] Visit Date: 04/24/2020  Today's healthcare provider: [name], MD   No chief complaint on file.  Subjective    HPI  Patient is requesting to have a COVID test. Patient reports having 2 positive home test on 04/10/2020.   {Show patient history (optional):23778::" "}  Medications: Outpatient Medications Prior to Visit  Medication Sig  . amLODipine (NORVASC) 5 MG tablet TAKE 1 TABLET(5 MG) BY MOUTH EVERY EVENING  . aspirin 81 MG tablet Take 81 mg by mouth daily. Reported on 01/01/2016  . cyclobenzaprine (FLEXERIL) 10 MG tablet TAKE 1 TABLET BY MOUTH 3  TIMES DAILY AS NEEDED FOR  MUSCLE SPASM(S)  . diclofenac (VOLTAREN) 50 MG EC tablet Take 1 tablet (50 mg total) by mouth 2 (two) times daily. Take with food (Patient not taking: Reported on 06/19/2019)  . diphenhydramine-acetaminophen (TYLENOL PM) 25-500 MG TABS Take 1 tablet by mouth at bedtime as needed (pain). Reported on 01/01/2016  . fluticasone (FLONASE) 50 MCG/ACT nasal spray fluticasone propionate 50 mcg/actuation nasal spray,suspension  . HYDROcodone-acetaminophen (NORCO) 10-325 MG tablet Take 1-2 tablets  by mouth every 6 (six) hours as needed for severe pain.  Marland Kitchen LORazepam (ATIVAN) 1 MG tablet Take 1 tablet (1 mg total) by mouth every 6 (six) hours as needed. for anxiety  . Melatonin 3 MG CAPS Take 3 mg by mouth as needed.   . naproxen (NAPROSYN) 500 MG tablet TAKE 1 TABLET BY MOUTH TWO  TIMES DAILY WITH A MEAL  . naproxen sodium (ALEVE) 220 MG tablet Take 220 mg by mouth daily as needed.  Marland Kitchen omeprazole (PRILOSEC) 40 MG capsule TAKE 1 CAPSULE(40 MG) BY MOUTH DAILY  . OVER THE COUNTER MEDICATION CEREBRA 2 TABLETS DAILY  . simvastatin (ZOCOR) 40 MG tablet Take 1 tablet (40 mg total) by mouth daily.  . tadalafil (CIALIS) 20 MG tablet TAKE ONE-HALF TO ONE TABLET BY MOUTH EVERY OTHER DAY AS NEEDED FOR ERECTILE DYSFUNCTION  . TESTOSTERONE NA Take 3 tablets by mouth daily. Nugenix  . venlafaxine XR (EFFEXOR-XR) 75 MG 24 hr capsule Take 3 capsules (225 mg total) by mouth every morning. (Patient not taking: Reported on 08/14/2019)   No facility-administered medications prior to visit.    Review of Systems  Constitutional: Negative for appetite change, chills and fever.  Respiratory: Negative for chest tightness, shortness of breath and wheezing.   Cardiovascular: Negative for chest pain and palpitations.  Gastrointestinal: Negative for abdominal pain, nausea and vomiting.    {Heme  Chem  Endocrine  Serology  Results Review (optional):23779::" "}  Objective    There were no vitals taken for this visit. {Show previous vital signs (optional):23777::" "}  Physical Exam  Assessment & Plan     ***  No follow-ups on file.     I discussed the assessment and treatment plan with the patient. The patient was provided an opportunity to ask questions and all were answered. The patient agreed with the plan and demonstrated an understanding of the instructions.   The patient was advised to call back or seek an in-person evaluation if the symptoms worsen or if the condition fails to improve as  anticipated.  I provided *** minutes of non-face-to-face time during this encounter.  {provider attestation***:1}  [name], MD [facility] [phone] (phone) [phone] (fax)  [facility]

## 2020-04-24 NOTE — Telephone Encounter (Signed)
Patient was scheduled for a virtial visit today at 1pm. I called patient to complete pre-charting for the visit. Patient informed me that he needs a rapid COVID test today. He says he needs to visit his girlfriend and she is requiring him to have a negative COVID test result before he visits. Patient plans to go to a testing facility today across from [facility] to get a rapid COVID test.

## 2020-04-28 ENCOUNTER — Telehealth: Payer: Medicare Other | Admitting: Family Medicine

## 2020-04-29 ENCOUNTER — Other Ambulatory Visit: Payer: Self-pay | Admitting: Family Medicine

## 2020-04-29 DIAGNOSIS — M5126 Other intervertebral disc displacement, lumbar region: Secondary | ICD-10-CM

## 2020-04-29 MED ORDER — HYDROCODONE-ACETAMINOPHEN 10-325 MG PO TABS: 1.0000 | ORAL_TABLET | Freq: Four times a day (QID) | ORAL | 0 refills | Status: DC | PRN

## 2020-05-13 ENCOUNTER — Other Ambulatory Visit: Payer: Self-pay | Admitting: Family Medicine

## 2020-05-13 DIAGNOSIS — F410 Panic disorder [episodic paroxysmal anxiety] without agoraphobia: Secondary | ICD-10-CM

## 2020-05-13 DIAGNOSIS — F430 Acute stress reaction: Secondary | ICD-10-CM

## 2020-05-13 NOTE — Telephone Encounter (Signed)
Requested medication (s) are due for refill today: Yes  Requested medication (s) are on the active medication list: Yes  Last refill:  04/22/20  Future visit scheduled: Yes  Notes to clinic:  See request.    Requested Prescriptions  Pending Prescriptions Disp Refills   LORazepam (ATIVAN) 1 MG tablet [Pharmacy Med Name: LORAZEPAM 1 MG TABLET] 90 tablet 0    Sig: Take 1 tablet (1 mg total) by mouth every 6 (six) hours as needed. for anxiety      Not Delegated - Psychiatry:  Anxiolytics/Hypnotics Failed - 05/13/2020 11:42 AM      Failed - This refill cannot be delegated      Failed - Urine Drug Screen completed in last 360 days.      Failed - Valid encounter within last 6 months    Recent Outpatient Visits           9 months ago Anxiety   [facility] Malva Limes, MD   11 months ago Low back pain potentially associated with radiculopathy   [facility] Malva Limes, MD   1 year ago Sciatica, unspecified laterality   [facility] Malva Limes, MD   1 year ago Need for influenza vaccination   [facility] Malva Limes, MD   1 year ago Anxiety   [facility] Malva Limes, MD

## 2020-05-14 NOTE — Telephone Encounter (Signed)
Pt states he is out of his ativan, and he is beginning to not feel well. Pt hopes to get asap , first thing in the am please.

## 2020-05-15 NOTE — Telephone Encounter (Signed)
Pt called to see if the request for LoRazepam  Was sent / Pt is asking for it to be sent today/he states he needs it before the weekend  / Pt also asked for a refill he will need on Tuesday for HYDROcodone-acetaminophen (NORCO) 10-325 MG tablet / please advise

## 2020-05-18 ENCOUNTER — Other Ambulatory Visit: Payer: Self-pay | Admitting: Family Medicine

## 2020-05-18 DIAGNOSIS — M5126 Other intervertebral disc displacement, lumbar region: Secondary | ICD-10-CM

## 2020-05-18 MED ORDER — HYDROCODONE-ACETAMINOPHEN 10-325 MG PO TABS: 1.0000 | ORAL_TABLET | Freq: Four times a day (QID) | ORAL | 0 refills | Status: DC | PRN

## 2020-05-28 ENCOUNTER — Other Ambulatory Visit: Payer: Self-pay | Admitting: Family Medicine

## 2020-05-28 DIAGNOSIS — I10 Essential (primary) hypertension: Secondary | ICD-10-CM

## 2020-06-02 NOTE — Progress Notes (Signed)
Subjective:   Dylan [name] is a 64 y.o. male who presents for Medicare Annual/Subsequent preventive examination.  I connected with Dylan [name] today by telephone and verified that I am speaking with the correct person using two identifiers. Location patient: home Location provider: work Persons participating in the virtual visit: patient, provider.   I discussed the limitations, risks, security and privacy concerns of performing an evaluation and management service by telephone and the availability of in person appointments. I also discussed with the patient that there may be a patient responsible charge related to this service. The patient expressed understanding and verbally consented to this telephonic visit.    Interactive audio and video telecommunications were attempted between this provider and patient, however failed, due to patient having technical difficulties OR patient did not have access to video capability.  We continued and completed visit with audio only.   Review of Systems    N/A  Cardiac Risk Factors include: male gender;hypertension;dyslipidemia     Objective:    Today's Vitals   06/03/20 1427  PainSc: 5    There is no height or weight on file to calculate BMI.  Advanced Directives 06/03/2020 05/29/2019 05/24/2018 07/05/2017 04/20/2016  Does Patient Have a Medical Advance Directive? Yes No No No No  Type of Estate agentAdvance Directive Healthcare Power of [place];Living will - - - -  Copy of Healthcare Power of Attorney in Chart? No - copy requested - - - -  Would patient like information on creating a medical advance directive? - No - Patient declined No - Patient declined No - Patient declined -  Pre-existing out of facility DNR order (yellow form or pink MOST form) - - - Yellow form placed in chart (order not valid for inpatient use) -    Current Medications (verified) Outpatient Encounter Medications as of 06/03/2020  Medication Sig  . amLODipine (NORVASC) 5 MG  tablet TAKE 1 TABLET(5 MG) BY MOUTH EVERY EVENING  . aspirin 81 MG tablet Take 81 mg by mouth daily. Reported on 01/01/2016  . cyclobenzaprine (FLEXERIL) 10 MG tablet TAKE 1 TABLET BY MOUTH 3  TIMES DAILY AS NEEDED FOR  MUSCLE SPASM(S)  . diphenhydramine-acetaminophen (TYLENOL PM) 25-500 MG TABS Take 1 tablet by mouth at bedtime as needed (pain). Reported on 01/01/2016  . fluticasone (FLONASE) 50 MCG/ACT nasal spray Place 1 spray into both nostrils as needed.   Marland Kitchen. HYDROcodone-acetaminophen (NORCO) 10-325 MG tablet Take 1-2 tablets by mouth every 6 (six) hours as needed for severe pain.  Marland Kitchen. LORazepam (ATIVAN) 1 MG tablet TAKE 1 TABLET (1 MG TOTAL) BY MOUTH EVERY 6 (SIX) HOURS AS NEEDED. FOR ANXIETY  . Melatonin 3 MG CAPS Take 3 mg by mouth as needed.   . naproxen sodium (ALEVE) 220 MG tablet Take 220 mg by mouth daily as needed.  . simvastatin (ZOCOR) 40 MG tablet Take 1 tablet (40 mg total) by mouth daily.  . tadalafil (CIALIS) 20 MG tablet TAKE ONE-HALF TO ONE TABLET BY MOUTH EVERY OTHER DAY AS NEEDED FOR ERECTILE DYSFUNCTION  . diclofenac (VOLTAREN) 50 MG EC tablet Take 1 tablet (50 mg total) by mouth 2 (two) times daily. Take with food (Patient not taking: Reported on 06/19/2019)  . naproxen (NAPROSYN) 500 MG tablet TAKE 1 TABLET BY MOUTH TWO  TIMES DAILY WITH A MEAL (Patient not taking: Reported on 06/03/2020)  . omeprazole (PRILOSEC) 40 MG capsule TAKE 1 CAPSULE(40 MG) BY MOUTH DAILY (Patient not taking: Reported on 06/03/2020)  . OVER THE COUNTER  MEDICATION CEREBRA 2 TABLETS DAILY (Patient not taking: Reported on 06/03/2020)  . TESTOSTERONE NA Take 3 tablets by mouth daily. Nugenix (Patient not taking: Reported on 06/03/2020)  . venlafaxine XR (EFFEXOR-XR) 75 MG 24 hr capsule Take 3 capsules (225 mg total) by mouth every morning. (Patient not taking: Reported on 08/14/2019)   No facility-administered encounter medications on file as of 06/03/2020.    Allergies (verified) Belsomra [suvorexant],  Morphine and related, Duloxetine, Meloxicam, and Other   History: Past Medical History:  Diagnosis Date  . History of chicken pox   . History of measles as a child   . History of mumps as a child   . Hyperlipidemia   . Hypertension   . Seizures (HCC)    blacked out in shower when coming off benzo - 'withdrawals'  . Shortness of breath dyspnea    with exertion ? d/t stress & anxiety   Past Surgical History:  Procedure Laterality Date  . COLONOSCOPY WITH PROPOFOL N/A 07/05/2017   Procedure: COLONOSCOPY WITH PROPOFOL;  Surgeon: Wyline Mood, MD;  Location: [facility] ENDOSCOPY;  Service: Gastroenterology;  Laterality: N/A;  . CT SCAN    . JOINT REPLACEMENT    . KNEE SURGERY Right 1993  . LUMBAR DISC SURGERY  03/18/2015   R L4-5 micro discectomy, Dr. Lovell Sheehan  . LUMBAR LAMINECTOMY/DECOMPRESSION MICRODISCECTOMY Right 03/18/2015   Procedure: Right Lumbar Four-Five Microdiscectomy;  Surgeon: Tressie Stalker, MD;  Location: [facility] NEURO ORS;  Service: Neurosurgery;  Laterality: Right;  Right L45 microdiskectomy  . LUNG SURGERY Left 2009   Lung Collapse: left chest tube  . ROTATOR CUFF REPAIR  2004   had rotator cuff injury and surgery was performed  . TONSILLECTOMY    . TONSILLECTOMY AND ADENOIDECTOMY  1960   Family History  Problem Relation Age of Onset  . Alzheimer's disease Mother   . Stroke Mother   . Heart disease Father   . Congestive Heart Failure Brother   . CAD Brother   . Congestive Heart Failure Sister    Social History   Socioeconomic History  . Marital status: Divorced    Spouse name: Not on file  . Number of children: 3  . Years of education: Not on file  . Highest education level: High school graduate  Occupational History  . Occupation: disability  Tobacco Use  . Smoking status: Never Smoker  . Smokeless tobacco: Never Used  Vaping Use  . Vaping Use: Never used  Substance and Sexual Activity  . Alcohol use: Yes    Comment: 1 beer on holidays  . Drug use: No  .  Sexual activity: Yes  Other Topics Concern  . Not on file  Social History Narrative   ** Merged History Encounter **       Social Determinants of Health   Financial Resource Strain: Medium Risk  . Difficulty of Paying Living Expenses: Somewhat hard  Food Insecurity: No Food Insecurity  . Worried About Programme researcher, broadcasting/film/video in the Last Year: Never true  . Ran Out of Food in the Last Year: Never true  Transportation Needs: No Transportation Needs  . Lack of Transportation (Medical): No  . Lack of Transportation (Non-Medical): No  Physical Activity: Inactive  . Days of Exercise per Week: 0 days  . Minutes of Exercise per Session: 0 min  Stress: Stress Concern Present  . Feeling of Stress : To some extent  Social Connections: Moderately Isolated  . Frequency of Communication with Friends and Family: Three times  a week  . Frequency of Social Gatherings with Friends and Family: More than three times a week  . Attends Religious Services: Never  . Active Member of Clubs or Organizations: Yes  . Attends Banker Meetings: More than 4 times per year  . Marital Status: Divorced    Tobacco Counseling Counseling given: Not Answered   Clinical Intake:  Pre-visit preparation completed: Yes  Pain : 0-10 Pain Score: 5  Pain Type: Chronic pain Pain Location: Back Pain Orientation: Lower Pain Descriptors / Indicators: Aching Pain Frequency: Constant Pain Relieving Factors: Takes Hydrocodone and Flexeril as needed for pain.  Pain Relieving Factors: Takes Hydrocodone and Flexeril as needed for pain.  Nutritional Risks: None Diabetes: No  How often do you need to have someone help you when you read instructions, pamphlets, or other written materials from your doctor or pharmacy?: 1 - Never  Diabetic? No  Interpreter Needed?: No  Information entered by :: [facility], LPN   Activities of Daily Living In your present state of health, do you have any difficulty  performing the following activities: 06/03/2020  Hearing? N  Vision? N  Difficulty concentrating or making decisions? N  Walking or climbing stairs? Y  Comment Due to right knee pain.  Dressing or bathing? N  Doing errands, shopping? N  Preparing Food and eating ? N  Using the Toilet? N  In the past six months, have you accidently leaked urine? N  Do you have problems with loss of bowel control? N  Managing your Medications? N  Managing your Finances? N  Housekeeping or managing your Housekeeping? N  Some recent data might be hidden    Patient Care Team: Malva Limes, MD as PCP - General (Family Medicine) Tressie Stalker, MD as Consulting Physician (Neurosurgery)  Indicate any recent Medical Services you may have received from other than [facility] providers in the past year (date may be approximate).     Assessment:   This is a routine wellness examination for Arrin.  Hearing/Vision screen No exam data present  Dietary issues and exercise activities discussed: Current Exercise Habits: The patient does not participate in regular exercise at present, Exercise limited by: orthopedic condition(s)  Goals    . Prevent falls     Recommend to remove any items from the home that may cause slips or trips.          Depression Screen PHQ 2/9 Scores 06/03/2020 05/29/2019 12/25/2018 05/24/2018 04/25/2017 04/20/2016 04/01/2015  PHQ - 2 Score 2 5 4 5 5 3 5   PHQ- 9 Score 11 20 13 21 19 18 20     Fall Risk Fall Risk  06/03/2020 08/14/2019 05/29/2019 05/24/2018 04/01/2015  Falls in the past year? 1 1 1 1  Yes  Comment Accidental falls due to dog. - - - -  Number falls in past yr: 1 1 1 1 2  or more  Injury with Fall? 0 1 0 0 Yes  Risk for fall due to : - - - Other (Comment) -  Risk for fall due to: Comment - - - "Looses balance when climbing steps." -  Follow up Falls prevention discussed - Falls prevention discussed - -    Any stairs in or around the home? Yes  If so, are there any  without handrails? No  Home free of loose throw rugs in walkways, pet beds, electrical cords, etc? Yes  Adequate lighting in your home to reduce risk of falls? Yes   ASSISTIVE DEVICES UTILIZED TO PREVENT  FALLS:  Life alert? No  Use of a cane, walker or w/c? No  Grab bars in the bathroom? No  Shower chair or bench in shower? Yes  Elevated toilet seat or a handicapped toilet? Yes   Cognitive Function:     6CIT Screen 05/24/2018  What Year? 0 points  What month? 0 points  What time? 0 points  Count back from 20 0 points  Months in reverse 0 points  Repeat phrase 2 points  Total Score 2    Immunizations Immunization History  Administered Date(s) Administered  . Influenza Split 04/07/2008  . Influenza,inj,Quad PF,6+ Mos 04/05/2013, 04/15/2014, 04/01/2015, 04/20/2016, 04/25/2017, 03/23/2018, 03/13/2019  . Tdap 09/12/2008  . Zoster 05/06/2016    TDAP status: Due, Education has been provided regarding the importance of this vaccine. Advised may receive this vaccine at local pharmacy or Health Dept. Aware to provide a copy of the vaccination record if obtained from local pharmacy or Health Dept. Verbalized acceptance and understanding. Flu Vaccine status: Declined, Education has been provided regarding the importance of this vaccine but patient still declined. Advised may receive this vaccine at local pharmacy or Health Dept. Aware to provide a copy of the vaccination record if obtained from local pharmacy or Health Dept. Verbalized acceptance and understanding. Covid-19 vaccine status: Declined, Education has been provided regarding the importance of this vaccine but patient still declined. Advised may receive this vaccine at local pharmacy or Health Dept.or vaccine clinic. Aware to provide a copy of the vaccination record if obtained from local pharmacy or Health Dept. Verbalized acceptance and understanding.  Qualifies for Shingles Vaccine? Yes   Zostavax completed Yes   Shingrix  Completed?: No.    Education has been provided regarding the importance of this vaccine. Patient has been advised to call insurance company to determine out of pocket expense if they have not yet received this vaccine. Advised may also receive vaccine at local pharmacy or Health Dept. Verbalized acceptance and understanding.  Screening Tests Health Maintenance  Topic Date Due  . INFLUENZA VACCINE  02/16/2020  . COVID-19 Vaccine (1) 06/19/2020 (Originally 09/18/1967)  . TETANUS/TDAP  06/03/2021 (Originally 09/12/2018)  . COLONOSCOPY  07/06/2027  . Hepatitis C Screening  Completed  . HIV Screening  Completed    Health Maintenance  Health Maintenance Due  Topic Date Due  . INFLUENZA VACCINE  02/16/2020    Colorectal cancer screening: Completed 07/05/17. Repeat every 10 years  Lung Cancer Screening: (Low Dose CT Chest recommended if Age 21-80 years, 30 pack-year currently smoking OR have quit w/in 15years.) does not qualify.   Additional Screening:  Hepatitis C Screening: Up to date  Vision Screening: Recommended annual ophthalmology exams for early detection of glaucoma and other disorders of the eye. Is the patient up to date with their annual eye exam? No Who is the provider or what is the name of the office in which the patient attends annual eye exams? N/A If pt is not established with a provider, would they like to be referred to a provider to establish care? No .   Dental Screening: Recommended annual dental exams for proper oral hygiene  Community Resource Referral / Chronic Care Management: CRR required this visit?  No   CCM required this visit?  No      Plan:     I have personally reviewed and noted the following in the patient's chart:   . Medical and social history . Use of alcohol, tobacco or illicit drugs  . Current  medications and supplements . Functional ability and status . Nutritional status . Physical activity . Advanced directives . List of other  physicians . Hospitalizations, surgeries, and ER visits in previous 12 months . Vitals . Screenings to include cognitive, depression, and falls . Referrals and appointments  In addition, I have reviewed and discussed with patient certain preventive protocols, quality metrics, and best practice recommendations. A written personalized care plan for preventive services as well as general preventive health recommendations were provided to patient.     Racer Quam [place], [place]   [id number]   Nurse Notes: Pt plans to receive his flu shot at his local pharmacy next week.

## 2020-06-03 ENCOUNTER — Ambulatory Visit (INDEPENDENT_AMBULATORY_CARE_PROVIDER_SITE_OTHER): Payer: Medicare Other

## 2020-06-03 ENCOUNTER — Other Ambulatory Visit: Payer: Self-pay

## 2020-06-03 DIAGNOSIS — Z0000 Encounter for general adult medical examination without abnormal findings: Secondary | ICD-10-CM | POA: Diagnosis not present

## 2020-06-03 NOTE — Patient Instructions (Signed)
Dylan [name] , Thank you for taking time to come for your Medicare Wellness Visit. I appreciate your ongoing commitment to your health goals. Please review the following plan we discussed and let me know if I can assist you in the future.   Screening recommendations/referrals: Colonoscopy: Up to date, due 06/2027 Recommended yearly ophthalmology/optometry visit for glaucoma screening and checkup Recommended yearly dental visit for hygiene and checkup  Vaccinations: Influenza vaccine: Currently due. Will receive at pharmacy next week. Tdap vaccine: Currently due, declined at this time.  Shingles vaccine: Shingrix discussed. Please contact your pharmacy for coverage information.     Advanced directives: Please bring a copy of your POA (Power of Attorney) and/or Living Will to your next appointment.   Conditions/risks identified: Fall risk preventatives discussed today.   Next appointment: 09/07/20 @ 8:00 AM with Dr Sherrie Mustache   Preventive Care 40-64 Years, Male Preventive care refers to lifestyle choices and visits with your health care provider that can promote health and wellness. What does preventive care include?  A yearly physical exam. This is also called an annual well check.  Dental exams once or twice a year.  Routine eye exams. Ask your health care provider how often you should have your eyes checked.  Personal lifestyle choices, including:  Daily care of your teeth and gums.  Regular physical activity.  Eating a healthy diet.  Avoiding tobacco and drug use.  Limiting alcohol use.  Practicing safe sex.  Taking low-dose aspirin every day starting at age 72. What happens during an annual well check? The services and screenings done by your health care provider during your annual well check will depend on your age, overall health, lifestyle risk factors, and family history of disease. Counseling  Your health care provider may ask you questions about your:  Alcohol  use.  Tobacco use.  Drug use.  Emotional well-being.  Home and relationship well-being.  Sexual activity.  Eating habits.  Work and work Astronomer. Screening  You may have the following tests or measurements:  Height, weight, and BMI.  Blood pressure.  Lipid and cholesterol levels. These may be checked every 5 years, or more frequently if you are over 4 years old.  Skin check.  Lung cancer screening. You may have this screening every year starting at age 68 if you have a 30-pack-year history of smoking and currently smoke or have quit within the past 15 years.  Fecal occult blood test (FOBT) of the stool. You may have this test every year starting at age 41.  Flexible sigmoidoscopy or colonoscopy. You may have a sigmoidoscopy every 5 years or a colonoscopy every 10 years starting at age 37.  Prostate cancer screening. Recommendations will vary depending on your family history and other risks.  Hepatitis C blood test.  Hepatitis B blood test.  Sexually transmitted disease (STD) testing.  Diabetes screening. This is done by checking your blood sugar (glucose) after you have not eaten for a while (fasting). You may have this done every 1-3 years. Discuss your test results, treatment options, and if necessary, the need for more tests with your health care provider. Vaccines  Your health care provider may recommend certain vaccines, such as:  Influenza vaccine. This is recommended every year.  Tetanus, diphtheria, and acellular pertussis (Tdap, Td) vaccine. You may need a Td booster every 10 years.  Zoster vaccine. You may need this after age 26.  Pneumococcal 13-valent conjugate (PCV13) vaccine. You may need this if you have certain conditions  and have not been vaccinated.  Pneumococcal polysaccharide (PPSV23) vaccine. You may need one or two doses if you smoke cigarettes or if you have certain conditions. Talk to your health care provider about which screenings  and vaccines you need and how often you need them. This information is not intended to replace advice given to you by your health care provider. Make sure you discuss any questions you have with your health care provider. Document Released: 07/31/2015 Document Revised: 03/23/2016 Document Reviewed: 05/05/2015 Elsevier Interactive Patient Education  2017 [employer].  Fall Prevention in the Home Falls can cause injuries. They can happen to people of all ages. There are many things you can do to make your home safe and to help prevent falls. What can I do on the outside of my home?  Regularly fix the edges of walkways and driveways and fix any cracks.  Remove anything that might make you trip as you walk through a door, such as a raised step or threshold.  Trim any bushes or trees on the path to your home.  Use bright outdoor lighting.  Clear any walking paths of anything that might make someone trip, such as rocks or tools.  Regularly check to see if handrails are loose or broken. Make sure that both sides of any steps have handrails.  Any raised decks and porches should have guardrails on the edges.  Have any leaves, snow, or ice cleared regularly.  Use sand or salt on walking paths during winter.  Clean up any spills in your garage right away. This includes oil or grease spills. What can I do in the bathroom?  Use night lights.  Install grab bars by the toilet and in the tub and shower. Do not use towel bars as grab bars.  Use non-skid mats or decals in the tub or shower.  If you need to sit down in the shower, use a plastic, non-slip stool.  Keep the floor dry. Clean up any water that spills on the floor as soon as it happens.  Remove soap buildup in the tub or shower regularly.  Attach bath mats securely with double-sided non-slip rug tape.  Do not have throw rugs and other things on the floor that can make you trip. What can I do in the bedroom?  Use night  lights.  Make sure that you have a light by your bed that is easy to reach.  Do not use any sheets or blankets that are too big for your bed. They should not hang down onto the floor.  Have a firm chair that has side arms. You can use this for support while you get dressed.  Do not have throw rugs and other things on the floor that can make you trip. What can I do in the kitchen?  Clean up any spills right away.  Avoid walking on wet floors.  Keep items that you use a lot in easy-to-reach places.  If you need to reach something above you, use a strong step stool that has a grab bar.  Keep electrical cords out of the way.  Do not use floor polish or wax that makes floors slippery. If you must use wax, use non-skid floor wax.  Do not have throw rugs and other things on the floor that can make you trip. What can I do with my stairs?  Do not leave any items on the stairs.  Make sure that there are handrails on both sides of the stairs  and use them. Fix handrails that are broken or loose. Make sure that handrails are as long as the stairways.  Check any carpeting to make sure that it is firmly attached to the stairs. Fix any carpet that is loose or worn.  Avoid having throw rugs at the top or bottom of the stairs. If you do have throw rugs, attach them to the floor with carpet tape.  Make sure that you have a light switch at the top of the stairs and the bottom of the stairs. If you do not have them, ask someone to add them for you. What else can I do to help prevent falls?  Wear shoes that:  Do not have high heels.  Have rubber bottoms.  Are comfortable and fit you well.  Are closed at the toe. Do not wear sandals.  If you use a stepladder:  Make sure that it is fully opened. Do not climb a closed stepladder.  Make sure that both sides of the stepladder are locked into place.  Ask someone to hold it for you, if possible.  Clearly mark and make sure that you can  see:  Any grab bars or handrails.  First and last steps.  Where the edge of each step is.  Use tools that help you move around (mobility aids) if they are needed. These include:  Canes.  Walkers.  Scooters.  Crutches.  Turn on the lights when you go into a dark area. Replace any light bulbs as soon as they burn out.  Set up your furniture so you have a clear path. Avoid moving your furniture around.  If any of your floors are uneven, fix them.  If there are any pets around you, be aware of where they are.  Review your medicines with your doctor. Some medicines can make you feel dizzy. This can increase your chance of falling. Ask your doctor what other things that you can do to help prevent falls. This information is not intended to replace advice given to you by your health care provider. Make sure you discuss any questions you have with your health care provider. Document Released: 04/30/2009 Document Revised: 12/10/2015 Document Reviewed: 08/08/2014 Elsevier Interactive Patient Education  2017 [employer].

## 2020-06-04 ENCOUNTER — Other Ambulatory Visit: Payer: Self-pay | Admitting: Family Medicine

## 2020-06-04 DIAGNOSIS — M5126 Other intervertebral disc displacement, lumbar region: Secondary | ICD-10-CM

## 2020-06-05 MED ORDER — HYDROCODONE-ACETAMINOPHEN 10-325 MG PO TABS: 1.0000 | ORAL_TABLET | Freq: Four times a day (QID) | ORAL | 0 refills | Status: DC | PRN

## 2020-06-19 ENCOUNTER — Telehealth: Payer: Self-pay

## 2020-06-19 NOTE — Telephone Encounter (Signed)
Copied from CRM [id number]. Topic: General - Other >> Jun 19, 2020 12:07 PM Dalphine Handing A wrote: Pt is returning call but doesn't know who called him. Please advise

## 2020-06-23 ENCOUNTER — Other Ambulatory Visit: Payer: Self-pay | Admitting: Family Medicine

## 2020-06-23 DIAGNOSIS — M5126 Other intervertebral disc displacement, lumbar region: Secondary | ICD-10-CM

## 2020-06-23 MED ORDER — HYDROCODONE-ACETAMINOPHEN 10-325 MG PO TABS: 1.0000 | ORAL_TABLET | Freq: Four times a day (QID) | ORAL | 0 refills | Status: DC | PRN

## 2020-07-07 ENCOUNTER — Other Ambulatory Visit: Payer: Self-pay | Admitting: Family Medicine

## 2020-07-07 DIAGNOSIS — M5126 Other intervertebral disc displacement, lumbar region: Secondary | ICD-10-CM

## 2020-07-09 MED ORDER — HYDROCODONE-ACETAMINOPHEN 10-325 MG PO TABS: 1.0000 | ORAL_TABLET | Freq: Four times a day (QID) | ORAL | 0 refills | Status: DC | PRN

## 2020-07-28 ENCOUNTER — Other Ambulatory Visit: Payer: Self-pay | Admitting: Family Medicine

## 2020-07-28 DIAGNOSIS — M5126 Other intervertebral disc displacement, lumbar region: Secondary | ICD-10-CM

## 2020-07-28 NOTE — Telephone Encounter (Signed)
20 days supply dispensed 07-11-20

## 2020-07-30 MED ORDER — HYDROCODONE-ACETAMINOPHEN 10-325 MG PO TABS: 1.0000 | ORAL_TABLET | Freq: Four times a day (QID) | ORAL | 0 refills | Status: DC | PRN

## 2020-08-10 ENCOUNTER — Other Ambulatory Visit: Payer: Self-pay | Admitting: Family Medicine

## 2020-08-10 DIAGNOSIS — F410 Panic disorder [episodic paroxysmal anxiety] without agoraphobia: Secondary | ICD-10-CM

## 2020-08-10 DIAGNOSIS — F430 Acute stress reaction: Secondary | ICD-10-CM

## 2020-08-10 NOTE — Telephone Encounter (Signed)
Medication Refill - Medication: LORazepam (ATIVAN) 1 MG tablet     Preferred Pharmacy (with phone number or street name):  [id number] - [place], [place] - [address] Phone:  [phone]  Fax:  [phone]        Agent: Please be advised that RX refills may take up to 3 business days. We ask that you follow-up with your pharmacy.   

## 2020-08-10 NOTE — Telephone Encounter (Signed)
Requested medication (s) are due for refill today: no  Requested medication (s) are on the active medication list: yes  Last refill: 05/15/2020  Future visit scheduled: yes  Notes to clinic:  this refill cannot be delegated    Requested Prescriptions  Pending Prescriptions Disp Refills   LORazepam (ATIVAN) 1 MG tablet 90 tablet 3    Sig: Take 1 tablet (1 mg total) by mouth every 6 (six) hours as needed. for anxiety      Not Delegated - Psychiatry:  Anxiolytics/Hypnotics Failed - 08/10/2020  2:15 PM      Failed - This refill cannot be delegated      Failed - Urine Drug Screen completed in last 360 days      Failed - Valid encounter within last 6 months    Recent Outpatient Visits           12 months ago Anxiety   [facility] Malva Limes, MD   1 year ago Low back pain potentially associated with radiculopathy   [facility] Malva Limes, MD   1 year ago Sciatica, unspecified laterality   [facility] Malva Limes, MD   1 year ago Need for influenza vaccination   [facility] Malva Limes, MD   1 year ago Anxiety   [facility] Malva Limes, MD

## 2020-08-12 ENCOUNTER — Other Ambulatory Visit: Payer: Self-pay | Admitting: Family Medicine

## 2020-08-12 DIAGNOSIS — F430 Acute stress reaction: Secondary | ICD-10-CM

## 2020-08-12 DIAGNOSIS — M5126 Other intervertebral disc displacement, lumbar region: Secondary | ICD-10-CM

## 2020-08-12 DIAGNOSIS — F410 Panic disorder [episodic paroxysmal anxiety] without agoraphobia: Secondary | ICD-10-CM

## 2020-08-12 MED ORDER — LORAZEPAM 1 MG PO TABS: 1.0000 mg | ORAL_TABLET | Freq: Four times a day (QID) | ORAL | 3 refills | Status: DC | PRN

## 2020-08-12 NOTE — Telephone Encounter (Signed)
20 days supply dispensed 07-30-2020

## 2020-08-17 MED ORDER — HYDROCODONE-ACETAMINOPHEN 10-325 MG PO TABS: 1.0000 | ORAL_TABLET | Freq: Four times a day (QID) | ORAL | 0 refills | Status: DC | PRN

## 2020-09-02 ENCOUNTER — Other Ambulatory Visit: Payer: Self-pay | Admitting: Family Medicine

## 2020-09-02 DIAGNOSIS — M5126 Other intervertebral disc displacement, lumbar region: Secondary | ICD-10-CM

## 2020-09-03 NOTE — Telephone Encounter (Signed)
Patient not seen since January 2021. Needs to schedule in office visit before refills can be approved.

## 2020-09-04 MED ORDER — HYDROCODONE-ACETAMINOPHEN 10-325 MG PO TABS: 1.0000 | ORAL_TABLET | Freq: Four times a day (QID) | ORAL | 0 refills | Status: DC | PRN

## 2020-09-07 ENCOUNTER — Encounter: Payer: Medicare Other | Admitting: Family Medicine

## 2020-09-16 ENCOUNTER — Other Ambulatory Visit: Payer: Self-pay | Admitting: Family Medicine

## 2020-09-16 DIAGNOSIS — K219 Gastro-esophageal reflux disease without esophagitis: Secondary | ICD-10-CM

## 2020-09-16 DIAGNOSIS — N529 Male erectile dysfunction, unspecified: Secondary | ICD-10-CM

## 2020-09-16 MED ORDER — OMEPRAZOLE 40 MG PO CPDR
DELAYED_RELEASE_CAPSULE | ORAL | 4 refills | Status: DC
Start: 2020-09-16 — End: 2021-11-15

## 2020-09-16 MED ORDER — TADALAFIL 20 MG PO TABS: ORAL_TABLET | ORAL | 3 refills | Status: DC

## 2020-09-16 NOTE — Telephone Encounter (Signed)
Medication Refill - Medication:   omeprazole (PRILOSEC) 40 MG capsule  tadalafil (CIALIS) 20 MG tablet   Has the patient contacted their pharmacy? Yes.  No refills left, contact pcp.   Preferred Pharmacy (with phone number or street name):   [id number] [place] [place] - [address]  [address] [place] [address]  Phone: [phone] Fax: [phone]    Agent: Please be advised that RX refills may take up to 3 business days. We ask that you follow-up with your pharmacy.

## 2020-09-16 NOTE — Telephone Encounter (Signed)
Requested medication (s) are due for refill today: yes  Requested medication (s) are on the active medication list: yes  Future visit scheduled: yes  Notes to clinic: Patient has appointment on 09/21/2020  Review for 30 day courtesy supply   Requested Prescriptions  Pending Prescriptions Disp Refills   omeprazole (PRILOSEC) 40 MG capsule 90 capsule 1    Sig: TAKE 1 CAPSULE(40 MG) BY MOUTH DAILY      Gastroenterology: Proton Pump Inhibitors Failed - 09/16/2020 11:15 AM      Failed - Valid encounter within last 12 months    Recent Outpatient Visits           1 year ago Anxiety   [facility] Malva Limes, MD   1 year ago Low back pain potentially associated with radiculopathy   [facility] Malva Limes, MD   1 year ago Sciatica, unspecified laterality   [facility], [facility]   1 year ago Need for influenza vaccination   [facility] Malva Limes, MD   1 year ago Anxiety   [facility] Malva Limes, MD                  tadalafil (CIALIS) 20 MG tablet 30 tablet 0    Sig: TAKE ONE-HALF TO ONE TABLET BY MOUTH EVERY OTHER DAY AS NEEDED FOR ERECTILE DYSFUNCTION      Urology: Erectile Dysfunction Agents Failed - 09/16/2020 11:15 AM      Failed - Last BP in normal range    BP Readings from Last 1 Encounters:  08/14/19 140/82          Failed - Valid encounter within last 12 months    Recent Outpatient Visits           1 year ago Anxiety   [facility] Malva Limes, MD   1 year ago Low back pain potentially associated with radiculopathy   [facility] Malva Limes, MD   1 year ago Sciatica, unspecified laterality   [facility] Malva Limes, MD   1 year ago Need for influenza vaccination   [facility] Malva Limes, MD   1 year ago Anxiety   [facility] Sherrie Mustache, Demetrios Isaacs, MD

## 2020-09-21 ENCOUNTER — Encounter: Payer: Medicare Other | Admitting: Family Medicine

## 2020-09-22 ENCOUNTER — Other Ambulatory Visit: Payer: Self-pay | Admitting: Family Medicine

## 2020-09-22 DIAGNOSIS — M5126 Other intervertebral disc displacement, lumbar region: Secondary | ICD-10-CM

## 2020-09-24 MED ORDER — HYDROCODONE-ACETAMINOPHEN 10-325 MG PO TABS: 1.0000 | ORAL_TABLET | Freq: Four times a day (QID) | ORAL | 0 refills | Status: DC | PRN

## 2020-10-01 MED ORDER — TADALAFIL 20 MG PO TABS: ORAL_TABLET | ORAL | 3 refills | Status: DC

## 2020-10-01 NOTE — Addendum Note (Signed)
Addended by: Stormy Card L on: 10/01/2020 02:00 PM   Modules accepted: Orders

## 2020-10-01 NOTE — Telephone Encounter (Addendum)
Pt went to pick up his Rx for [facility] (CIALIS[facility] and it will not be covered by his insurance and will cost over $600/ Pt would like the Rx sent to  [facility] - [place], [place] - [address] Phone:  [phone]  Fax:  [phone]     Instead so he can use his GoodRX card Wynelle Link advise when sent to [facility]

## 2020-10-06 ENCOUNTER — Telehealth: Payer: Self-pay

## 2020-10-06 NOTE — Telephone Encounter (Signed)
Copied from CRM [id number]. Topic: Referral - Request for Referral >> Oct 06, 2020 11:32 AM Elliot Gault wrote: Reason for CRM: Paitent requesting a referral to urologist stating he has never seen one and feels like its time. Patient states he has a overactive bladder for example he will have 2 cups of coffee and he would have to use the bathroom frequently. Patient would like to be referred to Dr. Vanna Scotland Urologist due to [address], [place], [place] [address]

## 2020-10-06 NOTE — Telephone Encounter (Signed)
Patient advised that he needs office visit for evaluation of symptoms and if warranted referral to Urology. Appointment scheduled for 10/16/2020 at 3:20pm.

## 2020-10-13 ENCOUNTER — Other Ambulatory Visit: Payer: Self-pay | Admitting: Family Medicine

## 2020-10-13 DIAGNOSIS — M5126 Other intervertebral disc displacement, lumbar region: Secondary | ICD-10-CM

## 2020-10-14 MED ORDER — HYDROCODONE-ACETAMINOPHEN 10-325 MG PO TABS: 1.0000 | ORAL_TABLET | Freq: Four times a day (QID) | ORAL | 0 refills | Status: DC | PRN

## 2020-10-15 ENCOUNTER — Telehealth: Payer: Self-pay

## 2020-10-15 NOTE — Telephone Encounter (Signed)
Please advise. Thanks.  

## 2020-10-15 NOTE — Telephone Encounter (Signed)
Copied from CRM [id number]. Topic: General - Other >> Oct 15, 2020 11:03 AM Pawlus, Dylan [name] wrote: Reason for CRM: Pt stated [place] [facility] continues to call him trying to send [name] Nurse aid to his home. Pt wanted some advice if he should agree to this. Please advise.

## 2020-10-16 ENCOUNTER — Ambulatory Visit: Payer: Self-pay | Admitting: Family Medicine

## 2020-10-23 ENCOUNTER — Encounter: Payer: Self-pay | Admitting: Family Medicine

## 2020-10-23 ENCOUNTER — Ambulatory Visit (INDEPENDENT_AMBULATORY_CARE_PROVIDER_SITE_OTHER): Payer: Medicare Other | Admitting: Family Medicine

## 2020-10-23 ENCOUNTER — Other Ambulatory Visit: Payer: Self-pay

## 2020-10-23 VITALS — BP 158/93 | HR 116 | Temp 98.2°F | Ht 70.0 in | Wt 191.8 lb

## 2020-10-23 DIAGNOSIS — F419 Anxiety disorder, unspecified: Secondary | ICD-10-CM | POA: Diagnosis not present

## 2020-10-23 DIAGNOSIS — E782 Mixed hyperlipidemia: Secondary | ICD-10-CM

## 2020-10-23 DIAGNOSIS — M5126 Other intervertebral disc displacement, lumbar region: Secondary | ICD-10-CM | POA: Diagnosis not present

## 2020-10-23 DIAGNOSIS — G8929 Other chronic pain: Secondary | ICD-10-CM | POA: Diagnosis not present

## 2020-10-23 DIAGNOSIS — I10 Essential (primary) hypertension: Secondary | ICD-10-CM

## 2020-10-23 DIAGNOSIS — M720 Palmar fascial fibromatosis [Dupuytren]: Secondary | ICD-10-CM

## 2020-10-23 DIAGNOSIS — R6882 Decreased libido: Secondary | ICD-10-CM

## 2020-10-23 DIAGNOSIS — Z125 Encounter for screening for malignant neoplasm of prostate: Secondary | ICD-10-CM

## 2020-10-23 NOTE — Patient Instructions (Addendum)
.   Please review the attached list of medications and notify my office if there are any errors.   . Please bring all of your medications to every appointment so we can make sure that our medication list is the same as yours.    Start back on venlafaxine by taking 1 capsule daily for two weeks. If the anxiety isn't better after two weeks then increase to 2 capsules daily

## 2020-10-23 NOTE — Progress Notes (Signed)
Established patient visit   Patient: Dylan [name]   DOB: 1956/03/28   65 y.o. Male  MRN: [id number] Visit Date: 10/23/2020  Today's healthcare provider: [name], MD   Chief Complaint  Patient presents with  . Fatigue   Subjective    HPI  Fatigue: Patient complains of chronic fatigue. This has been ongoing for several years. Patient states he has a past history of low testosterone and would like to have this checked today. He also has poor libido.  Hand Lesion: Patient has a lesion in the palm of his right hand. He first noticed the lesion 1 month ago and reports it has increased in size. Patient denies any pain from the lesion.  He is also due for follow up multiple other chronic conditions including hypertension, hyperlipidemia and anxiety. He has been out of simvastatin for several months. Has been out of venlafaxine for several months. Is taking lorazepam every day, but usually only two or three times and rarely four times in a day.      Medications: Outpatient Medications Prior to Visit  Medication Sig  . amLODipine (NORVASC) 5 MG tablet TAKE 1 TABLET(5 MG) BY MOUTH EVERY EVENING  . aspirin 81 MG tablet Take 81 mg by mouth daily. Reported on 01/01/2016  . cyclobenzaprine (FLEXERIL) 10 MG tablet TAKE 1 TABLET BY MOUTH 3  TIMES DAILY AS NEEDED FOR  MUSCLE SPASM(S)  . diclofenac (VOLTAREN) 50 MG EC tablet Take 1 tablet (50 mg total) by mouth 2 (two) times daily. Take with food  . diphenhydramine-acetaminophen (TYLENOL PM) 25-500 MG TABS Take 1 tablet by mouth at bedtime as needed (pain). Reported on 01/01/2016  . fluticasone (FLONASE) 50 MCG/ACT nasal spray Place 1 spray into both nostrils as needed.   Marland Kitchen HYDROcodone-acetaminophen (NORCO) 10-325 MG tablet Take 1-2 tablets by mouth every 6 (six) hours as needed for severe pain.  Marland Kitchen LORazepam (ATIVAN) 1 MG tablet Take 1 tablet (1 mg total) by mouth every 6 (six) hours as needed. for anxiety  . Melatonin 3 MG CAPS Take 3 mg  by mouth as needed.   . naproxen (NAPROSYN) 500 MG tablet TAKE 1 TABLET BY MOUTH TWO  TIMES DAILY WITH A MEAL  . naproxen sodium (ALEVE) 220 MG tablet Take 220 mg by mouth daily as needed.  . Niacin (VITAMIN B-3 PO) Take 1 tablet by mouth daily.  Marland Kitchen omeprazole (PRILOSEC) 40 MG capsule TAKE 1 CAPSULE(40 MG) BY MOUTH DAILY  . OVER THE COUNTER MEDICATION CEREBRA 2 TABLETS DAILY  . tadalafil (CIALIS) 20 MG tablet TAKE ONE-HALF TO ONE TABLET BY MOUTH EVERY OTHER DAY AS NEEDED FOR ERECTILE DYSFUNCTION  . simvastatin (ZOCOR) 40 MG tablet Take 1 tablet (40 mg total) by mouth daily. (Patient not taking: Reported on 10/23/2020)  . TESTOSTERONE NA Take 3 tablets by mouth daily. Nugenix (Patient not taking: Reported on 10/23/2020)  . venlafaxine XR (EFFEXOR-XR) 75 MG 24 hr capsule Take 3 capsules (225 mg total) by mouth every morning. (Patient not taking: Reported on 10/23/2020)   No facility-administered medications prior to visit.    Review of Systems  Constitutional: Positive for fatigue. Negative for appetite change and fever.  Respiratory: Negative for chest tightness, shortness of breath and wheezing.   Cardiovascular: Negative for chest pain and palpitations.  Gastrointestinal: Negative for abdominal pain.  Musculoskeletal:       Lesion in the palm of right hand       Objective    BP Marland Kitchen)  158/93 (BP Location: Right Arm, Patient Position: Sitting, Cuff Size: Large)   Pulse (!) 116   Temp 98.2 F (36.8 C) (Temporal)   Ht 5\' 10"  (1.778 m)   Wt 191 lb 12.8 oz (87 kg)   BMI 27.52 kg/m     Physical Exam    General: Appearance:     Overweight male in no acute distress  Eyes:    PERRL, conjunctiva/corneas clear, EOM's intact       Lungs:     Clear to auscultation bilaterally, respirations unlabored  Heart:    Tachycardic. Normal rhythm. No murmurs, rubs, or gallops.   MS:   All extremities are intact. Dupuytren's contracture of left hand.   Neurologic:   Awake, alert, oriented x 3. No  apparent focal neurological           defect.         Assessment & Plan     1. Dupuytren's contracture of left hand He's not really bothered by, he was just concerned it might be more serious. Advised we can refer to orthopedics if it becomes bothersome.   2. Decreased libido  - TSH - Testosterone,Free and Total  3. Anxiety Has been worse lately but is off of venlafaxine. He is to restart at just one a day and titrate if needed.   4. Essential (primary) hypertension Not ideally controlled, but he reports he is very anxious.  - TSH  5. Hyperlipidemia, mixed He has been out of simvastatin for at least the last year.  - CBC - Comprehensive metabolic panel - Lipid panel  6. Lumbar herniated disc Stable on current regiment of hydrocodone/apap   7. Other chronic pain   8. Prostate cancer screening  - PSA Total (Reflex To Free) ([facility] only)   Strongly encouraged he get Covid vaccine.       The entirety of the information documented in the History of Present Illness, Review of Systems and Physical Exam were personally obtained by me. Portions of this information were initially documented by the CMA and reviewed by me for thoroughness and accuracy.      , MD  [facility] [phone] (phone) [phone] (fax)  [facility]

## 2020-10-28 ENCOUNTER — Other Ambulatory Visit: Payer: Self-pay | Admitting: Family Medicine

## 2020-10-28 DIAGNOSIS — M5126 Other intervertebral disc displacement, lumbar region: Secondary | ICD-10-CM

## 2020-10-29 NOTE — Telephone Encounter (Signed)
30 days supply dispensed 10-14-2020

## 2020-11-02 MED ORDER — HYDROCODONE-ACETAMINOPHEN 10-325 MG PO TABS: 1.0000 | ORAL_TABLET | Freq: Four times a day (QID) | ORAL | 0 refills | Status: DC | PRN

## 2020-11-02 NOTE — Telephone Encounter (Signed)
Pt called saying he still has not recd his prescription of hydrocodone 10/325.  Pt will be out today.  He will do the lab work tomorrow.  [facility] [place]  CB#  [phone]

## 2020-11-03 DIAGNOSIS — I10 Essential (primary) hypertension: Secondary | ICD-10-CM | POA: Diagnosis not present

## 2020-11-03 DIAGNOSIS — E782 Mixed hyperlipidemia: Secondary | ICD-10-CM | POA: Diagnosis not present

## 2020-11-05 LAB — CBC
Hematocrit: 44.9 % (ref 37.5–51.0)
Hemoglobin: 15.2 g/dL (ref 13.0–17.7)
MCH: 31.3 pg (ref 26.6–33.0)
MCHC: 33.9 g/dL (ref 31.5–35.7)
MCV: 93 fL (ref 79–97)
Platelets: 357 10*3/uL (ref 150–450)
RBC: 4.85 x10E6/uL (ref 4.14–5.80)
RDW: 12.3 % (ref 11.6–15.4)
WBC: 11.6 10*3/uL — ABNORMAL HIGH (ref 3.4–10.8)

## 2020-11-05 LAB — LIPID PANEL
Chol/HDL Ratio: 5 ratio (ref 0.0–5.0)
Cholesterol, Total: 239 mg/dL — ABNORMAL HIGH (ref 100–199)
HDL: 48 mg/dL (ref >39)
LDL Chol Calc (NIH): 143 mg/dL — ABNORMAL HIGH (ref 0–99)
Triglycerides: 263 mg/dL — ABNORMAL HIGH (ref 0–149)
VLDL Cholesterol Cal: 48 mg/dL — ABNORMAL HIGH (ref 5–40)

## 2020-11-05 LAB — COMPREHENSIVE METABOLIC PANEL
ALT: 12 IU/L (ref 0–44)
AST: 13 IU/L (ref 0–40)
Albumin/Globulin Ratio: 2 (ref 1.2–2.2)
Albumin: 4.7 g/dL (ref 3.8–4.8)
Alkaline Phosphatase: 122 IU/L — ABNORMAL HIGH (ref 44–121)
BUN/Creatinine Ratio: 9 — ABNORMAL LOW (ref 10–24)
BUN: 9 mg/dL (ref 8–27)
Bilirubin Total: 0.3 mg/dL (ref 0.0–1.2)
CO2: 21 mmol/L (ref 20–29)
Calcium: 9.7 mg/dL (ref 8.6–10.2)
Chloride: 100 mmol/L (ref 96–106)
Creatinine, Ser: 0.97 mg/dL (ref 0.76–1.27)
Globulin, Total: 2.4 g/dL (ref 1.5–4.5)
Glucose: 131 mg/dL — ABNORMAL HIGH (ref 65–99)
Potassium: 3.9 mmol/L (ref 3.5–5.2)
Sodium: 140 mmol/L (ref 134–144)
Total Protein: 7.1 g/dL (ref 6.0–8.5)
eGFR: 87 mL/min/{1.73_m2} (ref >59)

## 2020-11-05 LAB — PSA TOTAL (REFLEX TO FREE): Prostate Specific Ag, Serum: 0.4 ng/mL (ref 0.0–4.0)

## 2020-11-05 LAB — TSH: TSH: 3.34 u[IU]/mL (ref 0.450–4.500)

## 2020-11-05 LAB — TESTOSTERONE,FREE AND TOTAL
Testosterone, Free: 7.1 pg/mL (ref 6.6–18.1)
Testosterone: 324 ng/dL (ref 264–916)

## 2020-11-05 MED ORDER — ROSUVASTATIN CALCIUM 5 MG PO TABS: 5.0000 mg | ORAL_TABLET | Freq: Every day | ORAL | 3 refills | Status: DC

## 2020-11-05 NOTE — Addendum Note (Signed)
Addended by: Paticia Stack on: 11/05/2020 03:40 PM   Modules accepted: Orders

## 2020-11-08 ENCOUNTER — Other Ambulatory Visit: Payer: Self-pay | Admitting: Family Medicine

## 2020-11-08 DIAGNOSIS — F430 Acute stress reaction: Secondary | ICD-10-CM

## 2020-11-08 DIAGNOSIS — F410 Panic disorder [episodic paroxysmal anxiety] without agoraphobia: Secondary | ICD-10-CM

## 2020-11-08 NOTE — Telephone Encounter (Signed)
Requested medication (s) are due for refill today: no  Requested medication (s) are on the active medication list: yes  Last refill:  08/12/20  Future visit scheduled: yes  Notes to clinic:  med not delegated to NT to refill or refuse   Requested Prescriptions  Pending Prescriptions Disp Refills   LORazepam (ATIVAN) 1 MG tablet [Pharmacy Med Name: LORAZEPAM 1 MG TABLET] 90 tablet 3    Sig: TAKE 1 TABLET (1 MG TOTAL) BY MOUTH EVERY 6 (SIX) HOURS AS NEEDED. FOR ANXIETY      Not Delegated - Psychiatry:  Anxiolytics/Hypnotics Failed - 11/08/2020 12:01 PM      Failed - This refill cannot be delegated      Failed - Urine Drug Screen completed in last 360 days      Passed - Valid encounter within last 6 months    Recent Outpatient Visits           2 weeks ago Dupuytren's contracture of left hand   [facility] Malva Limes, MD   1 year ago Anxiety   [facility] Malva Limes, MD   1 year ago Low back pain potentially associated with radiculopathy   [facility] Malva Limes, MD   1 year ago Sciatica, unspecified laterality   [facility] Malva Limes, MD   1 year ago Need for influenza vaccination   [facility] Malva Limes, MD

## 2020-11-16 ENCOUNTER — Telehealth: Payer: Self-pay

## 2020-11-16 ENCOUNTER — Encounter: Payer: Medicare Other | Admitting: Family Medicine

## 2020-11-16 NOTE — Telephone Encounter (Signed)
Please advise if prescription for Lorazepam, qty of 90 tablets is supposed to be a 30 day supply or 23 day supply. Patient states the pharmacy has the prescription documented as a 30 day supply, but in the past it was always a 23 day supply.

## 2020-11-16 NOTE — Telephone Encounter (Signed)
Copied from CRM [id number]. Topic: General - Other >> Nov 16, 2020  8:26 AM Jaquita Rector A wrote: Reason for CRM: Patient called in to inquire of Dr Sherrie Mustache about the dosage of his LORazepam (ATIVAN) 1 MG tablet  say that the pharmacy have him down for a 30 day supply when it should be 23 days. Please call  Ph# [phone]

## 2020-11-16 NOTE — Telephone Encounter (Signed)
There is no change in the prescriptions. It was sent as a 23 days supply .

## 2020-11-16 NOTE — Telephone Encounter (Signed)
I called and spoke with pharmacist who advised me that they had the prescription as a 30 day supply. I advised pharmacist that  prescription should be a 23 day supply. Pharmacist verbalized that she would make this correction in their system.

## 2020-11-18 ENCOUNTER — Other Ambulatory Visit: Payer: Self-pay | Admitting: Family Medicine

## 2020-11-18 DIAGNOSIS — M5126 Other intervertebral disc displacement, lumbar region: Secondary | ICD-10-CM

## 2020-11-18 NOTE — Telephone Encounter (Signed)
20 days supply dispensed 11-03-2019 (change next prescription to 21 days)

## 2020-11-20 MED ORDER — HYDROCODONE-ACETAMINOPHEN 10-325 MG PO TABS: 1.0000 | ORAL_TABLET | Freq: Four times a day (QID) | ORAL | 0 refills | Status: DC | PRN

## 2020-12-06 ENCOUNTER — Other Ambulatory Visit: Payer: Self-pay | Admitting: Family Medicine

## 2020-12-06 DIAGNOSIS — M5126 Other intervertebral disc displacement, lumbar region: Secondary | ICD-10-CM

## 2020-12-07 NOTE — Telephone Encounter (Addendum)
21 days supply dispensed 11-20-2020

## 2020-12-10 MED ORDER — HYDROCODONE-ACETAMINOPHEN 10-325 MG PO TABS: 1.0000 | ORAL_TABLET | Freq: Four times a day (QID) | ORAL | 0 refills | Status: DC | PRN

## 2020-12-16 ENCOUNTER — Other Ambulatory Visit: Payer: Self-pay | Admitting: Family Medicine

## 2020-12-16 DIAGNOSIS — M545 Low back pain, unspecified: Secondary | ICD-10-CM

## 2020-12-16 NOTE — Telephone Encounter (Signed)
Requested medications are due for refill today.  yes  Requested medications are on the active medications list.  Yes  Last refill. 04/24/2020  Future visit scheduled.   yes  Notes to clinic.  Medication not delegated.

## 2020-12-28 ENCOUNTER — Encounter: Payer: Medicare Other | Admitting: Family Medicine

## 2020-12-28 ENCOUNTER — Other Ambulatory Visit: Payer: Self-pay | Admitting: Family Medicine

## 2020-12-28 DIAGNOSIS — M5126 Other intervertebral disc displacement, lumbar region: Secondary | ICD-10-CM

## 2020-12-30 MED ORDER — HYDROCODONE-ACETAMINOPHEN 10-325 MG PO TABS: 1.0000 | ORAL_TABLET | Freq: Four times a day (QID) | ORAL | 0 refills | Status: DC | PRN

## 2020-12-30 NOTE — Telephone Encounter (Signed)
Pt calling to check status. Patient stated that he only has one pill left. Please advise

## 2021-01-14 ENCOUNTER — Other Ambulatory Visit: Payer: Self-pay | Admitting: Family Medicine

## 2021-01-14 DIAGNOSIS — M5126 Other intervertebral disc displacement, lumbar region: Secondary | ICD-10-CM

## 2021-01-19 MED ORDER — HYDROCODONE-ACETAMINOPHEN 10-325 MG PO TABS: 1.0000 | ORAL_TABLET | Freq: Four times a day (QID) | ORAL | 0 refills | Status: DC | PRN

## 2021-01-20 ENCOUNTER — Telehealth: Payer: Self-pay | Admitting: Family Medicine

## 2021-01-20 DIAGNOSIS — E782 Mixed hyperlipidemia: Secondary | ICD-10-CM

## 2021-01-20 NOTE — Telephone Encounter (Signed)
LMTCB 01/20/2021.  PEC please advise pt he is due for lab work.    Thanks,   -Vernona Rieger

## 2021-01-20 NOTE — Telephone Encounter (Signed)
Please advise patient it is time to check cholesterol since starting rosuvastatin in April. Please verify that he has been taking rosuvastatin, print order and leave at lab.

## 2021-01-20 NOTE — Telephone Encounter (Signed)
Pt called in stating he is still taking the rosuvastatin, and wants to get those labs done sometime next week. Please advise.

## 2021-02-01 ENCOUNTER — Other Ambulatory Visit: Payer: Self-pay | Admitting: Family Medicine

## 2021-02-01 DIAGNOSIS — E782 Mixed hyperlipidemia: Secondary | ICD-10-CM | POA: Diagnosis not present

## 2021-02-02 ENCOUNTER — Other Ambulatory Visit: Payer: Self-pay | Admitting: Family Medicine

## 2021-02-02 DIAGNOSIS — F430 Acute stress reaction: Secondary | ICD-10-CM

## 2021-02-02 DIAGNOSIS — F410 Panic disorder [episodic paroxysmal anxiety] without agoraphobia: Secondary | ICD-10-CM

## 2021-02-02 LAB — COMPREHENSIVE METABOLIC PANEL
ALT: 11 IU/L (ref 0–44)
AST: 17 IU/L (ref 0–40)
Albumin/Globulin Ratio: 1.9 (ref 1.2–2.2)
Albumin: 4.9 g/dL — ABNORMAL HIGH (ref 3.8–4.8)
Alkaline Phosphatase: 118 IU/L (ref 44–121)
BUN/Creatinine Ratio: 8 — ABNORMAL LOW (ref 10–24)
BUN: 9 mg/dL (ref 8–27)
Bilirubin Total: 0.3 mg/dL (ref 0.0–1.2)
CO2: 25 mmol/L (ref 20–29)
Calcium: 10.2 mg/dL (ref 8.6–10.2)
Chloride: 102 mmol/L (ref 96–106)
Creatinine, Ser: 1.07 mg/dL (ref 0.76–1.27)
Globulin, Total: 2.6 g/dL (ref 1.5–4.5)
Glucose: 110 mg/dL — ABNORMAL HIGH (ref 65–99)
Potassium: 5.1 mmol/L (ref 3.5–5.2)
Sodium: 144 mmol/L (ref 134–144)
Total Protein: 7.5 g/dL (ref 6.0–8.5)
eGFR: 77 mL/min/{1.73_m2} (ref >59)

## 2021-02-02 LAB — LIPID PANEL
Chol/HDL Ratio: 3.5 ratio (ref 0.0–5.0)
Cholesterol, Total: 188 mg/dL (ref 100–199)
HDL: 53 mg/dL (ref >39)
LDL Chol Calc (NIH): 100 mg/dL — ABNORMAL HIGH (ref 0–99)
Triglycerides: 204 mg/dL — ABNORMAL HIGH (ref 0–149)
VLDL Cholesterol Cal: 35 mg/dL (ref 5–40)

## 2021-02-02 NOTE — Telephone Encounter (Signed)
Requested medications are due for refill today.  yes  Requested medications are on the active medications list.  yes  Last refill. 11/10/2020  Future visit scheduled.   yes  Notes to clinic.  Medication not delegated.

## 2021-02-04 ENCOUNTER — Other Ambulatory Visit: Payer: Self-pay | Admitting: Family Medicine

## 2021-02-04 DIAGNOSIS — M5126 Other intervertebral disc displacement, lumbar region: Secondary | ICD-10-CM

## 2021-02-05 NOTE — Telephone Encounter (Signed)
Due to be dispensed 02-09-21

## 2021-02-08 MED ORDER — HYDROCODONE-ACETAMINOPHEN 10-325 MG PO TABS: 1.0000 | ORAL_TABLET | Freq: Four times a day (QID) | ORAL | 0 refills | Status: DC | PRN

## 2021-02-08 NOTE — Telephone Encounter (Signed)
Pt called this morning and wanted to check status of refill request/ he stated it was due for refill today and he is out of medication / pt asked if this can be sent today /please advise

## 2021-02-24 ENCOUNTER — Other Ambulatory Visit: Payer: Self-pay | Admitting: Family Medicine

## 2021-02-24 DIAGNOSIS — M5126 Other intervertebral disc displacement, lumbar region: Secondary | ICD-10-CM

## 2021-02-28 MED ORDER — HYDROCODONE-ACETAMINOPHEN 10-325 MG PO TABS: 1.0000 | ORAL_TABLET | Freq: Four times a day (QID) | ORAL | 0 refills | Status: DC | PRN

## 2021-03-01 ENCOUNTER — Other Ambulatory Visit: Payer: Self-pay | Admitting: Family Medicine

## 2021-03-01 DIAGNOSIS — E782 Mixed hyperlipidemia: Secondary | ICD-10-CM

## 2021-03-17 ENCOUNTER — Other Ambulatory Visit: Payer: Self-pay | Admitting: Family Medicine

## 2021-03-17 DIAGNOSIS — F410 Panic disorder [episodic paroxysmal anxiety] without agoraphobia: Secondary | ICD-10-CM

## 2021-03-17 DIAGNOSIS — M5126 Other intervertebral disc displacement, lumbar region: Secondary | ICD-10-CM

## 2021-03-17 DIAGNOSIS — F430 Acute stress reaction: Secondary | ICD-10-CM

## 2021-03-17 NOTE — Telephone Encounter (Signed)
Requested medication (s) are due for refill today: see request  Requested medication (s) are on the active medication list: yes  Last refill:  hydrocodone - 02/28/21 #168 0 refills , ativan - 02/03/21 #90 1 refill   Future visit scheduled: no  Notes to clinic:  not delegated per protocol, Patient comment: [name], I am planning to go to the beach Sunday if you could please send this to the pharmacy Sunday  morning t I would appreciate it. Thank you please have safe and healthy weekend thank you. Dylan [name]     Requested Prescriptions  Pending Prescriptions Disp Refills   HYDROcodone-acetaminophen (NORCO) 10-325 MG tablet 168 tablet 0    Sig: Take 1-2 tablets by mouth every 6 (six) hours as needed for severe pain.     Not Delegated - Analgesics:  Opioid Agonist Combinations Failed - 03/17/2021  2:36 PM      Failed - This refill cannot be delegated      Failed - Urine Drug Screen completed in last 360 days      Passed - Valid encounter within last 6 months    Recent Outpatient Visits           4 months ago Dupuytren's contracture of left hand   [facility] Malva Limes, MD   1 year ago Anxiety   [facility] Malva Limes, MD   1 year ago Low back pain potentially associated with radiculopathy   [facility] Malva Limes, MD   1 year ago Sciatica, unspecified laterality   [facility] Malva Limes, MD   2 years ago Need for influenza vaccination   [facility] Malva Limes, MD               LORazepam (ATIVAN) 1 MG tablet 90 tablet 1    Sig: Take 1 tablet (1 mg total) by mouth every 6 (six) hours as needed. for anxiety     Not Delegated - Psychiatry:  Anxiolytics/Hypnotics Failed - 03/17/2021  2:36 PM      Failed - This refill cannot be delegated      Failed - Urine Drug Screen completed in last 360 days      Passed - Valid encounter within last 6 months    Recent Outpatient  Visits           4 months ago Dupuytren's contracture of left hand   [facility] Malva Limes, MD   1 year ago Anxiety   [facility] Malva Limes, MD   1 year ago Low back pain potentially associated with radiculopathy   [facility] Malva Limes, MD   1 year ago Sciatica, unspecified laterality   [facility] Malva Limes, MD   2 years ago Need for influenza vaccination   [facility] Malva Limes, MD

## 2021-03-17 NOTE — Telephone Encounter (Signed)
Pt called and wants to also refill his Lorazepam (30 day supply) same pharmacy listed below. Has contacted pharmacy

## 2021-03-19 MED ORDER — HYDROCODONE-ACETAMINOPHEN 10-325 MG PO TABS: 1.0000 | ORAL_TABLET | Freq: Four times a day (QID) | ORAL | 0 refills | Status: DC | PRN

## 2021-03-19 MED ORDER — LORAZEPAM 1 MG PO TABS: 1.0000 mg | ORAL_TABLET | Freq: Four times a day (QID) | ORAL | 1 refills | Status: DC | PRN

## 2021-04-01 ENCOUNTER — Telehealth: Payer: Self-pay

## 2021-04-01 NOTE — Telephone Encounter (Signed)
Copied from CRM [id number]. Topic: General - Other >> Apr 01, 2021  9:43 AM Gwenlyn Fudge wrote: Reason for CRM: Pt calling in regards to his AWV appt on 04/05/21.  He states that he cannot make it that day and is requesting to have something later in the week. Please advise.

## 2021-04-01 NOTE — Telephone Encounter (Signed)
Left message advising pt there are no opening for physicals the week of the 04/05/2021.  PEC please reschedule his physical to the next available when he calls back.   Thanks,   -Vernona Rieger

## 2021-04-05 ENCOUNTER — Encounter: Payer: Medicare Other | Admitting: Family Medicine

## 2021-04-05 ENCOUNTER — Telehealth: Payer: Self-pay | Admitting: Family Medicine

## 2021-04-05 DIAGNOSIS — F410 Panic disorder [episodic paroxysmal anxiety] without agoraphobia: Secondary | ICD-10-CM

## 2021-04-05 DIAGNOSIS — F430 Acute stress reaction: Secondary | ICD-10-CM

## 2021-04-05 NOTE — Telephone Encounter (Signed)
Patient called in says he requested lorazepam to be changed to 30 day supply instead of 90 becaue pharmacy has to keep overiding with his insurance when he goes to  pick it up. Please call back

## 2021-04-06 ENCOUNTER — Telehealth: Payer: Self-pay

## 2021-04-06 MED ORDER — LORAZEPAM 1 MG PO TABS: 1.0000 mg | ORAL_TABLET | Freq: Four times a day (QID) | ORAL | 1 refills | Status: DC | PRN

## 2021-04-06 NOTE — Telephone Encounter (Signed)
Copied from CRM [id number]. Topic: General - Other >> Apr 06, 2021 11:20 AM Gaetana Michaelis A wrote: Reason for CRM: Patient would like to be contacted regarding coordination of their LORazepam (ATIVAN) 1 MG tablet [[id number] prescription as well as the rescheduling of their AWV appointment  Please contact further when available

## 2021-04-07 ENCOUNTER — Other Ambulatory Visit: Payer: Self-pay | Admitting: Family Medicine

## 2021-04-07 DIAGNOSIS — M5126 Other intervertebral disc displacement, lumbar region: Secondary | ICD-10-CM

## 2021-04-07 NOTE — Telephone Encounter (Signed)
Pt advised.   Thanks,   -Jonette Wassel  

## 2021-04-07 NOTE — Telephone Encounter (Signed)
I called and spoke with patient. Patient states that his Clonazepam prescription is supposed to be a 23 day supply, but the pharmacy has it as a 30 day supply. When he tried to fill the prescription, it keeps saying that it is too soon to fill prescription. Patient is requesting that we contact his pharmacy to let them know that the clonazepam is a 23 day supply. Please advise if the clonazepam prescription is supposed to be a 23 day supply or 30 day supply.

## 2021-04-07 NOTE — Telephone Encounter (Signed)
I sent prescription for lorazepam on 04/06/2021 for #120 which is a 30 day supply.

## 2021-04-08 ENCOUNTER — Other Ambulatory Visit: Payer: Self-pay | Admitting: Family Medicine

## 2021-04-08 DIAGNOSIS — E782 Mixed hyperlipidemia: Secondary | ICD-10-CM

## 2021-04-09 MED ORDER — HYDROCODONE-ACETAMINOPHEN 10-325 MG PO TABS: 1.0000 | ORAL_TABLET | Freq: Four times a day (QID) | ORAL | 0 refills | Status: DC | PRN

## 2021-04-13 ENCOUNTER — Telehealth: Payer: Self-pay

## 2021-04-13 NOTE — Telephone Encounter (Signed)
It looks like pt has an appointment 05/17/2021.  It is okay for him to get his labs done before they appointment?   Thanks,   -Vernona Rieger

## 2021-04-13 NOTE — Telephone Encounter (Signed)
Copied from CRM [id number]. Topic: Appointment Scheduling - Scheduling Inquiry for Clinic >> Apr 13, 2021  3:19 PM Gwenlyn Fudge wrote: Reason for CRM: Pt called stating that he would like to have his labs drawn on Monday. Please advise.

## 2021-04-14 NOTE — Telephone Encounter (Signed)
He's not due for any labs.

## 2021-04-14 NOTE — Telephone Encounter (Signed)
Tried calling patient. Left detailed message on voice message system (ok per DPR). 

## 2021-04-15 ENCOUNTER — Other Ambulatory Visit: Payer: Self-pay | Admitting: Family Medicine

## 2021-04-15 DIAGNOSIS — F430 Acute stress reaction: Secondary | ICD-10-CM

## 2021-04-15 DIAGNOSIS — F410 Panic disorder [episodic paroxysmal anxiety] without agoraphobia: Secondary | ICD-10-CM

## 2021-04-15 NOTE — Telephone Encounter (Signed)
Requested medication (s) are due for refill today: no  Requested medication (s) are on the active medication list: yes  Last refill: 04/06/21 #120  1 refill  Future visit scheduled yes 05/17/21  Notes to clinic:  Not delegated       Patient would like his LORazepam (ATIVAN) 1 [facility] [address] [place], [place], [place] [address] [phone]. Patient states he is out and the initial rx was sent [facility]  [phone] [place] [place] - [address] Phone:  [phone]  Fax:  [phone] and [facility] [place] [facility] closed.   Requested Prescriptions  Pending Prescriptions Disp Refills   LORazepam (ATIVAN) 1 MG tablet 120 tablet 1    Sig: Take 1 tablet (1 mg total) by mouth every 6 (six) hours as needed. for anxiety     Not Delegated - Psychiatry:  Anxiolytics/Hypnotics Failed - 04/15/2021  2:37 PM      Failed - This refill cannot be delegated      Failed - Urine Drug Screen completed in last 360 days      Passed - Valid encounter within last 6 months    Recent Outpatient Visits           5 months ago Dupuytren's contracture of left hand   [facility] Malva Limes, MD   1 year ago Anxiety   [facility] Malva Limes, MD   1 year ago Low back pain potentially associated with radiculopathy   [facility] Malva Limes, MD   1 year ago Sciatica, unspecified laterality   [facility] Malva Limes, MD   2 years ago Need for influenza vaccination   [facility] Malva Limes, MD       Future Appointments             In 1 month Fisher, Demetrios Isaacs, MD [facility]

## 2021-04-15 NOTE — Telephone Encounter (Signed)
Patient would like his LORazepam (ATIVAN) 1 MG tablet [facility] to [facility] [address] [place], [place] [address] [phone]. Patient states he is out and the initial rx was [facility]  [phone] [place], [place] - [address] Phone:  [phone]  Fax:  [phone] and [facility] in [place] has closed.

## 2021-04-15 NOTE — Addendum Note (Signed)
Addended by: Elby Beck F on: 04/15/2021 02:37 PM   Modules accepted: Orders

## 2021-04-16 ENCOUNTER — Ambulatory Visit: Payer: Medicare Other | Admitting: Family Medicine

## 2021-04-16 NOTE — Telephone Encounter (Signed)
[facility] [place]. Please review. Thanks!

## 2021-04-17 MED ORDER — LORAZEPAM 1 MG PO TABS: 1.0000 mg | ORAL_TABLET | Freq: Four times a day (QID) | ORAL | 1 refills | Status: DC | PRN

## 2021-04-17 NOTE — Addendum Note (Signed)
Addended by: Malva Limes on: 04/17/2021 08:25 AM   Modules accepted: Orders

## 2021-04-28 ENCOUNTER — Telehealth: Payer: Self-pay | Admitting: Family Medicine

## 2021-04-28 DIAGNOSIS — M5126 Other intervertebral disc displacement, lumbar region: Secondary | ICD-10-CM

## 2021-04-28 NOTE — Telephone Encounter (Signed)
LOV: 10/23/2020 NOV: 05/17/2021   Last Refill:  04/09/2021 #168 0 Refill   Thanks,   Vernona Rieger

## 2021-04-28 NOTE — Telephone Encounter (Signed)
Patient called and states he no longer  uses [facility]. Patient would like medication mentioned below and all medications to go to [address] [place], [place] [address] [phone]

## 2021-04-29 ENCOUNTER — Other Ambulatory Visit: Payer: Self-pay

## 2021-04-29 MED ORDER — HYDROCODONE-ACETAMINOPHEN 10-325 MG PO TABS
1.0000 | ORAL_TABLET | Freq: Four times a day (QID) | ORAL | 0 refills | Status: DC | PRN
Start: 2021-04-29 — End: 2021-04-30
  Filled 2021-04-29: qty 168, 21d supply, fill #0

## 2021-04-30 MED ORDER — HYDROCODONE-ACETAMINOPHEN 10-325 MG PO TABS: 1.0000 | ORAL_TABLET | Freq: Four times a day (QID) | ORAL | 0 refills | Status: DC | PRN

## 2021-04-30 NOTE — Telephone Encounter (Signed)
Pt called to check status. Patient states this is urgent. Please advise

## 2021-04-30 NOTE — Telephone Encounter (Signed)
Was sent to [facility] pharmacy yesterday. Please check with pharmacy to see if they received prescription.

## 2021-04-30 NOTE — Addendum Note (Signed)
Addended by: [name] E on: 04/30/2021 12:15 PM   Modules accepted: Orders

## 2021-04-30 NOTE — Telephone Encounter (Signed)
Pt is no longer using [facility] he wants to use [facility].   Thanks,   -Vernona Rieger

## 2021-05-17 ENCOUNTER — Encounter: Payer: Self-pay | Admitting: Family Medicine

## 2021-05-17 ENCOUNTER — Ambulatory Visit (INDEPENDENT_AMBULATORY_CARE_PROVIDER_SITE_OTHER): Payer: Medicare Other | Admitting: Family Medicine

## 2021-05-17 ENCOUNTER — Other Ambulatory Visit: Payer: Self-pay

## 2021-05-17 VITALS — BP 133/81 | HR 105 | Temp 98.4°F | Resp 18 | Ht 70.0 in | Wt 184.0 lb

## 2021-05-17 DIAGNOSIS — H6982 Other specified disorders of Eustachian tube, left ear: Secondary | ICD-10-CM

## 2021-05-17 DIAGNOSIS — Z23 Encounter for immunization: Secondary | ICD-10-CM | POA: Diagnosis not present

## 2021-05-17 MED ORDER — FLUTICASONE PROPIONATE 50 MCG/ACT NA SUSP: 2.0000 | Freq: Every day | NASAL | 1 refills | Status: AC

## 2021-05-17 MED ORDER — AMOXICILLIN 500 MG PO CAPS: 1000.0000 mg | ORAL_CAPSULE | Freq: Two times a day (BID) | ORAL | 0 refills | Status: AC

## 2021-05-17 NOTE — Patient Instructions (Signed)
Please review the attached list of medications and notify my office if there are any errors.   Call for referral to [facility] if your ear is not much better when finished with the antiobitic

## 2021-05-17 NOTE — Progress Notes (Signed)
I,Dylan [name],acting as a scribe for Dylan Merry, MD.,have documented all relevant documentation on the behalf of Dylan Merry, MD,as directed by  Dylan Merry, MD while in the presence of Dylan Merry, MD.   Established patient visit   Patient: Dylan [name]   DOB: Feb 10, 1956   65 y.o. Male  MRN: [id number] Visit Date: 05/17/2021  Today's healthcare provider: [name], MD   Chief Complaint  Patient presents with   Ear Fullness   Subjective    HPI  Patient is having fullness in his left ear. Patient states ear has been popping. Also left ear hurts when he sleeps on it. He has not tried any treatments at home. Has been going on for a few months.     Medications: Outpatient Medications Prior to Visit  Medication Sig   amLODipine (NORVASC) 5 MG tablet TAKE 1 TABLET(5 MG) BY MOUTH EVERY EVENING   aspirin 81 MG tablet Take 81 mg by mouth daily. Reported on 01/01/2016   cyclobenzaprine (FLEXERIL) 10 MG tablet TAKE 1 TABLET BY MOUTH 3 TIMES DAILY AS NEEDED FOR MUSCLE SPASM(S)   diclofenac (VOLTAREN) 50 MG EC tablet Take 1 tablet (50 mg total) by mouth 2 (two) times daily. Take with food   diphenhydramine-acetaminophen (TYLENOL PM) 25-500 MG TABS Take 1 tablet by mouth at bedtime as needed (pain). Reported on 01/01/2016   fluticasone (FLONASE) 50 MCG/ACT nasal spray Place 1 spray into both nostrils as needed.    HYDROcodone-acetaminophen (NORCO) 10-325 MG tablet Take 1-2 tablets by mouth every 6 (six) hours as needed for severe pain.   LORazepam (ATIVAN) 1 MG tablet Take 1 tablet (1 mg total) by mouth every 6 (six) hours as needed. for anxiety   Melatonin 3 MG CAPS Take 3 mg by mouth as needed.    naproxen (NAPROSYN) 500 MG tablet TAKE 1 TABLET BY MOUTH TWO  TIMES DAILY WITH A MEAL   naproxen sodium (ALEVE) 220 MG tablet Take 220 mg by mouth daily as needed.   Niacin (VITAMIN B-3 PO) Take 1 tablet by mouth daily.   omeprazole (PRILOSEC) 40 MG capsule TAKE 1 CAPSULE(40 MG) BY  MOUTH DAILY   OVER THE COUNTER MEDICATION CEREBRA 2 TABLETS DAILY   rosuvastatin (CRESTOR) 5 MG tablet TAKE 1 TABLET (5 MG TOTAL) BY MOUTH DAILY.   tadalafil (CIALIS) 20 MG tablet TAKE ONE-HALF TO ONE TABLET BY MOUTH EVERY OTHER DAY AS NEEDED FOR ERECTILE DYSFUNCTION   TESTOSTERONE NA Take 3 tablets by mouth daily. Nugenix (Patient not taking: No sig reported)   venlafaxine XR (EFFEXOR-XR) 75 MG 24 hr capsule Take 3 capsules (225 mg total) by mouth every morning. (Patient not taking: No sig reported)   No facility-administered medications prior to visit.    Review of Systems     Objective    BP 133/81 (BP Location: Right Arm, Patient Position: Sitting, Cuff Size: Large)   Pulse (!) 105   Temp 98.4 F (36.9 C) (Oral)   Resp 18   Ht 5\' 10"  (1.778 m)   Wt 184 lb (83.5 kg)   SpO2 100%   BMI 26.40 kg/m  {Show previous vital signs (optional):23777}  Physical Exam  General Appearance:     Overweight male, alert, cooperative, in no acute distress  HENT:   Ear canals with minimal cerumen. Left TM dull. No erythema. Frontal sinuses tender. Small amount yellow drainag.e   Eyes:    PERRL, conjunctiva/corneas clear, EOM's intact       Lungs:  Clear to auscultation bilaterally, respirations unlabored  Heart:    Tachycardic. Normal rhythm. No murmurs, rubs, or gallops.    Neurologic:   Awake, alert, oriented x 3. No apparent focal neurological           defect.          Assessment & Plan     1. Dysfunction of left eustachian tube  - fluticasone (FLONASE) 50 MCG/ACT nasal spray; Place 2 sprays into both nostrils daily.  Dispense: 16 g; Refill: 1 - amoxicillin (AMOXIL) 500 MG capsule; Take 2 capsules (1,000 mg total) by mouth 2 (two) times daily for 10 days.  Dispense: 40 capsule; Refill: 0  2. Need for influenza vaccination  - Flu Vaccine QUAD High Dose IM (Fluad)         The entirety of the information documented in the History of Present Illness, Review of Systems and  Physical Exam were personally obtained by me. Portions of this information were initially documented by the CMA and reviewed by me for thoroughness and accuracy.     Dylan Merry, MD  [facility] [phone] (phone) [phone] (fax)  [facility]

## 2021-05-18 ENCOUNTER — Other Ambulatory Visit: Payer: Self-pay | Admitting: Family Medicine

## 2021-05-18 DIAGNOSIS — M5126 Other intervertebral disc displacement, lumbar region: Secondary | ICD-10-CM

## 2021-05-19 MED ORDER — HYDROCODONE-ACETAMINOPHEN 10-325 MG PO TABS: 1.0000 | ORAL_TABLET | Freq: Four times a day (QID) | ORAL | 0 refills | Status: DC | PRN

## 2021-05-24 DIAGNOSIS — M6281 Muscle weakness (generalized): Secondary | ICD-10-CM | POA: Diagnosis not present

## 2021-05-24 DIAGNOSIS — M4807 Spinal stenosis, lumbosacral region: Secondary | ICD-10-CM | POA: Diagnosis not present

## 2021-05-24 DIAGNOSIS — M5442 Lumbago with sciatica, left side: Secondary | ICD-10-CM | POA: Diagnosis not present

## 2021-05-24 DIAGNOSIS — G8929 Other chronic pain: Secondary | ICD-10-CM | POA: Diagnosis not present

## 2021-05-24 DIAGNOSIS — M48061 Spinal stenosis, lumbar region without neurogenic claudication: Secondary | ICD-10-CM | POA: Diagnosis not present

## 2021-05-24 DIAGNOSIS — I2510 Atherosclerotic heart disease of native coronary artery without angina pectoris: Secondary | ICD-10-CM | POA: Diagnosis not present

## 2021-05-24 DIAGNOSIS — G9520 Unspecified cord compression: Secondary | ICD-10-CM | POA: Diagnosis not present

## 2021-05-24 DIAGNOSIS — R200 Anesthesia of skin: Secondary | ICD-10-CM | POA: Diagnosis not present

## 2021-05-24 DIAGNOSIS — M549 Dorsalgia, unspecified: Secondary | ICD-10-CM | POA: Diagnosis not present

## 2021-05-24 DIAGNOSIS — M47816 Spondylosis without myelopathy or radiculopathy, lumbar region: Secondary | ICD-10-CM | POA: Diagnosis not present

## 2021-05-24 DIAGNOSIS — I10 Essential (primary) hypertension: Secondary | ICD-10-CM | POA: Diagnosis not present

## 2021-05-24 DIAGNOSIS — E785 Hyperlipidemia, unspecified: Secondary | ICD-10-CM | POA: Diagnosis not present

## 2021-05-24 DIAGNOSIS — Z7982 Long term (current) use of aspirin: Secondary | ICD-10-CM | POA: Diagnosis not present

## 2021-05-24 DIAGNOSIS — Z5321 Procedure and treatment not carried out due to patient leaving prior to being seen by health care provider: Secondary | ICD-10-CM | POA: Diagnosis not present

## 2021-05-24 DIAGNOSIS — Z885 Allergy status to narcotic agent status: Secondary | ICD-10-CM | POA: Diagnosis not present

## 2021-05-24 DIAGNOSIS — M47814 Spondylosis without myelopathy or radiculopathy, thoracic region: Secondary | ICD-10-CM | POA: Diagnosis not present

## 2021-05-24 DIAGNOSIS — R339 Retention of urine, unspecified: Secondary | ICD-10-CM | POA: Diagnosis not present

## 2021-05-25 DIAGNOSIS — M48061 Spinal stenosis, lumbar region without neurogenic claudication: Secondary | ICD-10-CM | POA: Diagnosis not present

## 2021-05-25 DIAGNOSIS — R339 Retention of urine, unspecified: Secondary | ICD-10-CM | POA: Diagnosis not present

## 2021-05-25 DIAGNOSIS — M4807 Spinal stenosis, lumbosacral region: Secondary | ICD-10-CM | POA: Diagnosis not present

## 2021-05-25 DIAGNOSIS — G9520 Unspecified cord compression: Secondary | ICD-10-CM | POA: Diagnosis not present

## 2021-05-25 DIAGNOSIS — M47814 Spondylosis without myelopathy or radiculopathy, thoracic region: Secondary | ICD-10-CM | POA: Diagnosis not present

## 2021-05-25 DIAGNOSIS — R200 Anesthesia of skin: Secondary | ICD-10-CM | POA: Diagnosis not present

## 2021-05-25 DIAGNOSIS — M47816 Spondylosis without myelopathy or radiculopathy, lumbar region: Secondary | ICD-10-CM | POA: Diagnosis not present

## 2021-05-25 DIAGNOSIS — M6281 Muscle weakness (generalized): Secondary | ICD-10-CM | POA: Diagnosis not present

## 2021-05-25 DIAGNOSIS — M549 Dorsalgia, unspecified: Secondary | ICD-10-CM | POA: Diagnosis not present

## 2021-05-26 DIAGNOSIS — M48061 Spinal stenosis, lumbar region without neurogenic claudication: Secondary | ICD-10-CM | POA: Diagnosis not present

## 2021-05-26 DIAGNOSIS — M7020 Olecranon bursitis, unspecified elbow: Secondary | ICD-10-CM | POA: Insufficient documentation

## 2021-05-26 DIAGNOSIS — M4808 Spinal stenosis, sacral and sacrococcygeal region: Secondary | ICD-10-CM | POA: Diagnosis not present

## 2021-05-26 DIAGNOSIS — T83011A Breakdown (mechanical) of indwelling urethral catheter, initial encounter: Secondary | ICD-10-CM | POA: Diagnosis not present

## 2021-05-26 DIAGNOSIS — R197 Diarrhea, unspecified: Secondary | ICD-10-CM | POA: Diagnosis not present

## 2021-05-26 DIAGNOSIS — R1110 Vomiting, unspecified: Secondary | ICD-10-CM | POA: Diagnosis not present

## 2021-05-26 DIAGNOSIS — Z885 Allergy status to narcotic agent status: Secondary | ICD-10-CM | POA: Diagnosis not present

## 2021-05-26 DIAGNOSIS — R319 Hematuria, unspecified: Secondary | ICD-10-CM | POA: Diagnosis not present

## 2021-05-26 DIAGNOSIS — R339 Retention of urine, unspecified: Secondary | ICD-10-CM | POA: Diagnosis not present

## 2021-05-26 DIAGNOSIS — I10 Essential (primary) hypertension: Secondary | ICD-10-CM | POA: Diagnosis not present

## 2021-05-26 DIAGNOSIS — R5381 Other malaise: Secondary | ICD-10-CM | POA: Diagnosis not present

## 2021-05-26 DIAGNOSIS — Z20822 Contact with and (suspected) exposure to covid-19: Secondary | ICD-10-CM | POA: Diagnosis not present

## 2021-05-26 DIAGNOSIS — R1030 Lower abdominal pain, unspecified: Secondary | ICD-10-CM | POA: Diagnosis not present

## 2021-05-26 DIAGNOSIS — R6883 Chills (without fever): Secondary | ICD-10-CM | POA: Diagnosis not present

## 2021-05-26 DIAGNOSIS — N3001 Acute cystitis with hematuria: Secondary | ICD-10-CM | POA: Diagnosis not present

## 2021-05-26 DIAGNOSIS — T83511A Infection and inflammatory reaction due to indwelling urethral catheter, initial encounter: Secondary | ICD-10-CM | POA: Diagnosis not present

## 2021-05-27 ENCOUNTER — Ambulatory Visit: Payer: Self-pay | Admitting: *Deleted

## 2021-05-27 NOTE — Telephone Encounter (Signed)
Was in [facility] on 05/26/21 for acute cystitis with hematuria. Was discharged with bladder catheter intact. He was told to follow-up with pcp and urology. See ED notes in Epic. Patient doing okay at this time. Catheter draining clear yellow urine. Emptied leg bag once and good flow can be seen. Calling to request referral be sent today to [facility] Attn Joycelyn Man. Currently has a hospital f/u with pcp on 06/16/21  Routing to pcp for review. Reason for Disposition . Health Information question, no triage required and triager able to answer question  Answer Assessment - Initial Assessment Questions 1. REASON FOR CALL or QUESTION: "What is your reason for calling today?" or "How can I best help you?" or "What question do you have that I can help answer?"     Patient has a bladder catheter from ED yesterday and needs follow-up referral with [facility].  Protocols used: Information Only Call - No Triage-A-AH

## 2021-05-28 ENCOUNTER — Telehealth: Payer: Self-pay | Admitting: Family Medicine

## 2021-05-28 DIAGNOSIS — R339 Retention of urine, unspecified: Secondary | ICD-10-CM

## 2021-05-28 NOTE — Telephone Encounter (Signed)
Patient was in ED 11/7 and 11/9 for urinary retention and had catheter placed. He needs urgent referral to urology for follow up of urinary retention. we don't manage those.

## 2021-05-28 NOTE — Telephone Encounter (Signed)
Pt advised.  He would like to see someone in [place].

## 2021-05-28 NOTE — Telephone Encounter (Signed)
See telephone encounter   Thanks,   -Sumiko Ceasar  

## 2021-05-31 ENCOUNTER — Other Ambulatory Visit: Payer: Self-pay

## 2021-05-31 ENCOUNTER — Emergency Department: Payer: Medicare Other

## 2021-05-31 ENCOUNTER — Ambulatory Visit: Payer: Self-pay | Admitting: *Deleted

## 2021-05-31 ENCOUNTER — Telehealth: Payer: Self-pay | Admitting: Family Medicine

## 2021-05-31 ENCOUNTER — Emergency Department
Admission: EM | Admit: 2021-05-31 | Discharge: 2021-05-31 | Disposition: A | Discharge status: Home / Self Care | Payer: Medicare Other | Attending: Student in an Organized Health Care Education/Training Program | Admitting: Student in an Organized Health Care Education/Training Program

## 2021-05-31 DIAGNOSIS — Z743 Need for continuous supervision: Secondary | ICD-10-CM | POA: Diagnosis not present

## 2021-05-31 DIAGNOSIS — Z9660 Presence of unspecified orthopedic joint implant: Secondary | ICD-10-CM | POA: Diagnosis not present

## 2021-05-31 DIAGNOSIS — W1839XA Other fall on same level, initial encounter: Secondary | ICD-10-CM | POA: Insufficient documentation

## 2021-05-31 DIAGNOSIS — M954 Acquired deformity of chest and rib: Secondary | ICD-10-CM | POA: Diagnosis not present

## 2021-05-31 DIAGNOSIS — I10 Essential (primary) hypertension: Secondary | ICD-10-CM | POA: Diagnosis not present

## 2021-05-31 DIAGNOSIS — Z79899 Other long term (current) drug therapy: Secondary | ICD-10-CM | POA: Diagnosis not present

## 2021-05-31 DIAGNOSIS — R0781 Pleurodynia: Secondary | ICD-10-CM | POA: Insufficient documentation

## 2021-05-31 DIAGNOSIS — M439 Deforming dorsopathy, unspecified: Secondary | ICD-10-CM | POA: Diagnosis not present

## 2021-05-31 DIAGNOSIS — R6889 Other general symptoms and signs: Secondary | ICD-10-CM | POA: Diagnosis not present

## 2021-05-31 DIAGNOSIS — Z7982 Long term (current) use of aspirin: Secondary | ICD-10-CM | POA: Diagnosis not present

## 2021-05-31 DIAGNOSIS — R918 Other nonspecific abnormal finding of lung field: Secondary | ICD-10-CM | POA: Diagnosis not present

## 2021-05-31 DIAGNOSIS — R52 Pain, unspecified: Secondary | ICD-10-CM | POA: Diagnosis not present

## 2021-05-31 DIAGNOSIS — M5126 Other intervertebral disc displacement, lumbar region: Secondary | ICD-10-CM

## 2021-05-31 LAB — COMPREHENSIVE METABOLIC PANEL
ALT: 14 U/L (ref 0–44)
AST: 15 U/L (ref 15–41)
Albumin: 4.2 g/dL (ref 3.5–5.0)
Alkaline Phosphatase: 82 U/L (ref 38–126)
Anion gap: 10 (ref 5–15)
BUN: 11 mg/dL (ref 8–23)
CO2: 25 mmol/L (ref 22–32)
Calcium: 9.1 mg/dL (ref 8.9–10.3)
Chloride: 103 mmol/L (ref 98–111)
Creatinine, Ser: 0.69 mg/dL (ref 0.61–1.24)
GFR, Estimated: >60 mL/min (ref >60)
Glucose, Bld: 107 mg/dL — ABNORMAL HIGH (ref 70–99)
Potassium: 3.5 mmol/L (ref 3.5–5.1)
Sodium: 138 mmol/L (ref 135–145)
Total Bilirubin: 0.8 mg/dL (ref 0.3–1.2)
Total Protein: 7.4 g/dL (ref 6.5–8.1)

## 2021-05-31 LAB — CBC
HCT: 45.7 % (ref 39.0–52.0)
Hemoglobin: 15.5 g/dL (ref 13.0–17.0)
MCH: 30.4 pg (ref 26.0–34.0)
MCHC: 33.9 g/dL (ref 30.0–36.0)
MCV: 89.6 fL (ref 80.0–100.0)
Platelets: 316 10*3/uL (ref 150–400)
RBC: 5.1 MIL/uL (ref 4.22–5.81)
RDW: 12.6 % (ref 11.5–15.5)
WBC: 12 10*3/uL — ABNORMAL HIGH (ref 4.0–10.5)
nRBC: 0 % (ref 0.0–0.2)

## 2021-05-31 LAB — LIPASE, BLOOD: Lipase: 29 U/L (ref 11–51)

## 2021-05-31 MED ORDER — LIDOCAINE 5 % EX PTCH
1.0000 | MEDICATED_PATCH | CUTANEOUS | Status: DC
  Administered 2021-05-31: 1 via TRANSDERMAL
  Filled 2021-05-31: qty 1

## 2021-05-31 MED ORDER — HYDROCODONE-ACETAMINOPHEN 10-325 MG PO TABS
1.0000 | ORAL_TABLET | Freq: Once | ORAL | Status: AC
  Administered 2021-05-31: 1 via ORAL
  Filled 2021-05-31: qty 1

## 2021-05-31 MED ORDER — LIDOCAINE 5 % EX PTCH: 1.0000 | MEDICATED_PATCH | Freq: Two times a day (BID) | CUTANEOUS | 0 refills | Status: AC

## 2021-05-31 MED ORDER — HYDROCODONE-ACETAMINOPHEN 10-325 MG PO TABS: 1.0000 | ORAL_TABLET | Freq: Four times a day (QID) | ORAL | 0 refills | Status: DC | PRN

## 2021-05-31 NOTE — ED Triage Notes (Signed)
First Nurse Note:  Arrives via [facility] for ED evaluation.  C/O difficulty urinating .  Has foley in place, needs follow up with urology.  Current foley placed on Saturday at [facility].
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
## 2021-05-31 NOTE — Telephone Encounter (Signed)
Reason for Disposition  SEVERE chest pain  Answer Assessment - Initial Assessment Questions 1. MECHANISM: "How did the injury happen?"     Patient fell over toys in floor 2. ONSET: "When did the injury happen?" (Minutes or hours ago)     Friday night- patient fell 3. LOCATION: "Where on the chest is the injury located?"     R front and back 4. APPEARANCE: "What does the injury look like?"     Distortion of bone 5. BLEEDING: "Is there any bleeding now? If Yes, ask: How long has it been bleeding?"     no 6. SEVERITY: "Any difficulty with breathing?"     Yes-  7. SIZE: For cuts, bruises, or swelling, ask: "How large is it?" (e.g., inches or centimeters)     no 8. PAIN: "Is there pain?" If Yes, ask: "How bad is the pain?"   (e.g., Scale 1-10; or mild, moderate, severe)     severe 9. TETANUS: For any breaks in the skin, ask: "When was the last tetanus booster?"     na 10. PREGNANCY: "Is there any chance you are pregnant?" "When was your last menstrual period?"       na  Protocols used: Chest Injury-A-AH

## 2021-05-31 NOTE — Telephone Encounter (Signed)
Patient states he fell Friday night- he tripped and injured his R ribs and back. Patient is in pain- and is moaning during conversation. Patient advised ED for evaluation. Patient states he has a catheter in place for urinary retention and infection. Patient states the catheter in draining and he is able to empty it. Patient advised due to the amount of pain he is in- he need chest x-ray and pain control. Patient states he will need transportation to ED and will call for EMS transport.

## 2021-05-31 NOTE — Telephone Encounter (Signed)
Medication Refill - Medication:  HYDROcodone-acetaminophen (NORCO) 10-325 MG tablet   Has the patient contacted their pharmacy? Yes.    *Pt stated he is currently in the Hospital and was transferred into another and somewhere in between he has lost the medication and needs another refill, pt stated he did get a few tablets today but it still wont be enough*   Preferred Pharmacy (with phone number or street name):  [facility] [place] [place] - [address]  [address] [place] [address]  Phone:  [phone]  Fax:  [phone]  Has the patient been seen for an appointment in the last year OR does the patient have an upcoming appointment? Yes.    Agent: Please be advised that RX refills may take up to 3 business days. We ask that you follow-up with your pharmacy.

## 2021-05-31 NOTE — ED Triage Notes (Signed)
Pt was evaluated Thursday at [facility] and sent to [facility], pt was diagnosed with rib fractures and told his bladder shut down so he has a foley attached to a leg bag, pt states that his bag was left behind and it had his pain medications in it. Pt is here today due to cont to having pain

## 2021-06-01 ENCOUNTER — Ambulatory Visit: Payer: Self-pay | Admitting: *Deleted

## 2021-06-01 MED ORDER — HYDROCODONE-ACETAMINOPHEN 10-325 MG PO TABS: 1.0000 | ORAL_TABLET | Freq: Four times a day (QID) | ORAL | 0 refills | Status: DC | PRN

## 2021-06-01 NOTE — Telephone Encounter (Signed)
As per pharmacist unaware patient lost his medication and states its to early to refill requesting to speak with PCP  nurse directly regarding HYDROcodone-acetaminophen (NORCO) 10-325 MG tablet  [facility] [place],  - [address] Phone:  [phone]  Fax:  [phone]

## 2021-06-01 NOTE — Telephone Encounter (Addendum)
Pt calling in.  I called earlier.   I was in the hospital and transferred to one hospital to the next because they couldn't do a MRI.  They moved me from [facility]   They lost my stuff.  Pt was telling me random things.     Dr Sherrie Mustache said they sent it in at 7:45 this morning.  They say they don't have it.   I need my pain medication.    I clarified that he was calling about his pain medication.   He said yes.   The pharmacy keeps telling him they don't have it.   Dr. Sherrie Mustache needs to given the ok for it to be filled early.   I really need my pain medication.   I'm in a lot of pain from broken ribs and I have a catheter in because my bladder shut down.  I fell and cracked some ribs I'm in serious pain.  The Lidocaine pain is not helping.   I have a catheter in me because my bladder shut down.   I've got to have my pain medicine.    [facility] does not have the Rx for the hydrocodone.     I called into [facility] to check on the hydrocodone rx.   They are waiting on Dr. Sherrie Mustache to give the ok for it to be filled early.   The transmittal looks like it was received at [facility] at 7:45 AM this morning.  Per office staff they are waiting on Dr. Sherrie Mustache to give the ok for it to be filled early.  I let the pt know this.   He asked if someone could call him.   I let him know to check with the pharmacy but I would also put in a request for someone to call him too. He thanked me for my help.    I sent this information to [facility].

## 2021-06-01 NOTE — Telephone Encounter (Signed)
hydrocodone/apap was sent to [facility] this morning. Please let pharmacy know it's OK to refill early due to injuries.

## 2021-06-01 NOTE — Telephone Encounter (Signed)
Ok to refill early  

## 2021-06-01 NOTE — Telephone Encounter (Signed)
Requested medication (s) are due for refill today:   Provider to review  Requested medication (s) are on the active medication list:   Yes  Future visit scheduled:   Yes   Last ordered: 05/19/2021 #168, 0 refills  Returned because this is a non delegated refill.   Looks like it was prescribed during a visit to the ED at [facility] however Dr. Sherrie Mustache has prescribed this for him too.   Requested Prescriptions  Pending Prescriptions Disp Refills   HYDROcodone-acetaminophen (NORCO) 10-325 MG tablet 6 tablet 0    Sig: Take 1 tablet by mouth every 6 (six) hours as needed.     Not Delegated - Analgesics:  Opioid Agonist Combinations Failed - 05/31/2021  3:08 PM      Failed - This refill cannot be delegated      Failed - Urine Drug Screen completed in last 360 days      Passed - Valid encounter within last 6 months    Recent Outpatient Visits           2 weeks ago Dysfunction of left eustachian tube   [facility] Malva Limes, MD   7 months ago Dupuytren's contracture of left hand   [facility] Malva Limes, MD   1 year ago Anxiety   [facility] Malva Limes, MD   2 years ago Low back pain potentially associated with radiculopathy   [facility] Malva Limes, MD   2 years ago Sciatica, unspecified laterality   [facility] Malva Limes, MD       Future Appointments             In 1 week Vanna Scotland, MD [facility]   In 1 week [place]   In 2 weeks Sherrie Mustache, Demetrios Isaacs, MD [facility]   In 3 months Sherrie Mustache, Demetrios Isaacs, MD [facility]

## 2021-06-02 ENCOUNTER — Encounter: Payer: Self-pay | Admitting: *Deleted

## 2021-06-02 NOTE — Telephone Encounter (Signed)
I called pharmacy and and was advised that patient already picked up prescription yesterday.

## 2021-06-03 ENCOUNTER — Telehealth: Payer: Self-pay | Admitting: Family Medicine

## 2021-06-03 NOTE — Telephone Encounter (Signed)
Patient called to ask about his request. He says that on 05/26/21 he went to the ED at [facility] and was prescribed Cephalexin to take TID for UTI. He says he only took maybe 1-2 days and lost the bag with that medication and the Hydrocodone. He says he received the Rx for Hydrocodone and has been still taking the Amoxicillin for his ear infection. He wants to know will that help the UTI and if now will Dr. Sherrie Mustache prescribe another antibiotic so that he will not have to pay out of pocket for an early refill. I advised I will send this request to Dr. Sherrie Mustache and someone will call with his recommendations. Patient reports he has an indwelling urinary catheter and no issues with urine flow.

## 2021-06-03 NOTE — Telephone Encounter (Signed)
Pt stated that he has an Rx for amoxicillin from Dr. Sherrie Mustache / pt misplaced his bag with his Cephalexin (keflex) for a bladder infection that was prescribed by [facility] / pt wanted to know if he can take the Rx for amoxicillin in place of the Cephalexin that was lost or if he can get a refill for Cephalexin sent to [facility]/ please advise

## 2021-06-04 NOTE — Telephone Encounter (Signed)
Pt advised.   Thanks,   -Kemya Shed  

## 2021-06-04 NOTE — Telephone Encounter (Signed)
The amoxicillin should take care of the UTI.

## 2021-06-07 ENCOUNTER — Other Ambulatory Visit: Payer: Self-pay | Admitting: Family Medicine

## 2021-06-07 DIAGNOSIS — M545 Low back pain, unspecified: Secondary | ICD-10-CM

## 2021-06-08 NOTE — Progress Notes (Signed)
06/09/21 8:49 AM   Dylan [name] August 25, 1955 [id number]  Referring provider:  Malva Limes, MD [address] [place],  [place] [address] Chief Complaint  Patient presents with   Urinary Retention     HPI: Dylan [name] is a 65 y.o.male who presents today for fill and pull.   He was seen in the ED on 05/25/2021 . was initially transferred for assessment of new onset urinary tension and worsening of his chronic low back pain with concern for spinal cord compression. MRI imaging was reassuring, but due to urinary retention, Foley catheter was placed, he had 600 cc.  He was seen again in the ED on 05/26/2021 with abdominal pain and difficulty with the catheter and as well as chills and general malaise. Has had several episodes of vomiting and loose watery stools but no focal abdominal pain or objective fevers. . His evaluation was reassuring and his foley catheter was repositioned. 31fr non-latex straight tip foley inserted using sterile technique. Balloon inflated with no discomfort noted from patient. Present urine return and comfort endorsed by patient.His urinalysis  with culture revealed moderate leukocytes, large blood, 54 RBCs, 8 WBCs, and rare bacteria, urine culture showed no growth. He was given IV Zofran.   He was seen in th ED again for a mechanical fall on 05/31/2021.   He reports today that he initially had problems with his sciatica due to a back injury. He was having normal stream and then very suddenly could not urinate. He denies burning or pain. He does have constipation. He has never before noticed blood in his urine but he noticed blood today, he reports that his dog had stepped on it earlier which he believes may have caused the blood.   Denies a personal history of urinary retention.    He reports his sister had a kidney removed.    PMH: Past Medical History:  Diagnosis Date   History of chicken pox    History of measles as a child    History of mumps  as a child    Hyperlipidemia    Hypertension    Seizures (HCC)    blacked out in shower when coming off benzo - 'withdrawals'   Shortness of breath dyspnea    with exertion ? d/t stress & anxiety    Surgical History: Past Surgical History:  Procedure Laterality Date   COLONOSCOPY WITH PROPOFOL N/A 07/05/2017   Procedure: COLONOSCOPY WITH PROPOFOL;  Surgeon: Wyline Mood, MD;  Location: [facility] ENDOSCOPY;  Service: Gastroenterology;  Laterality: N/A;   CT SCAN     JOINT REPLACEMENT     KNEE SURGERY Right 1993   LUMBAR DISC SURGERY  03/18/2015   R L4-5 micro discectomy, Dr. Steele Berg LAMINECTOMY/DECOMPRESSION MICRODISCECTOMY Right 03/18/2015   Procedure: Right Lumbar Four-Five Microdiscectomy;  Surgeon: Tressie Stalker, MD;  Location: [facility] NEURO ORS;  Service: Neurosurgery;  Laterality: Right;  Right L45 microdiskectomy   LUNG SURGERY Left 2009   Lung Collapse: left chest tube   ROTATOR CUFF REPAIR  2004   had rotator cuff injury and surgery was performed   TONSILLECTOMY     TONSILLECTOMY AND ADENOIDECTOMY  1960    Home Medications:  Allergies as of 06/09/2021       Reactions   Belsomra [suvorexant] Other (See Comments)   Hallucinations   Morphine And Related Hives, Nausea And Vomiting   Dry heaves and hives   Duloxetine Other (See Comments)   Nervous   Meloxicam Other (  See Comments)   Stomach cramps   Other Other (See Comments)   CBD oil - felt anxious        Medication List        Accurate as of June 09, 2021  8:49 AM. If you have any questions, ask your nurse or doctor.          amLODipine 5 MG tablet Commonly known as: NORVASC TAKE 1 TABLET(5 MG) BY MOUTH EVERY EVENING   aspirin 81 MG tablet Take 81 mg by mouth daily. Reported on 01/01/2016   cyclobenzaprine 10 MG tablet Commonly known as: FLEXERIL TAKE 1 TABLET BY MOUTH 3 TIMES DAILY AS NEEDED FOR MUSCLE SPASM(S)   diclofenac 50 MG EC tablet Commonly known as: VOLTAREN Take 1 tablet (50 mg  total) by mouth 2 (two) times daily. Take with food   diphenhydramine-acetaminophen 25-500 MG Tabs tablet Commonly known as: TYLENOL PM Take 1 tablet by mouth at bedtime as needed (pain). Reported on 01/01/2016   fluticasone 50 MCG/ACT nasal spray Commonly known as: FLONASE Place 1 spray into both nostrils as needed.   fluticasone 50 MCG/ACT nasal spray Commonly known as: FLONASE Place 2 sprays into both nostrils daily.   HYDROcodone-acetaminophen 10-325 MG tablet Commonly known as: NORCO Take 1-2 tablets by mouth every 6 (six) hours as needed for severe pain.   lidocaine 5 % Commonly known as: Lidoderm Place 1 patch onto the skin every 12 (twelve) hours. Remove & Discard patch within 12 hours or as directed by MD   LORazepam 1 MG tablet Commonly known as: ATIVAN Take 1 tablet (1 mg total) by mouth every 6 (six) hours as needed. for anxiety   Melatonin 3 MG Caps Take 3 mg by mouth as needed.   naproxen 500 MG tablet Commonly known as: NAPROSYN TAKE 1 TABLET BY MOUTH TWO  TIMES DAILY WITH A MEAL   naproxen sodium 220 MG tablet Commonly known as: ALEVE Take 220 mg by mouth daily as needed.   omeprazole 40 MG capsule Commonly known as: PRILOSEC TAKE 1 CAPSULE(40 MG) BY MOUTH DAILY   OVER THE COUNTER MEDICATION CEREBRA 2 TABLETS DAILY   rosuvastatin 5 MG tablet Commonly known as: CRESTOR TAKE 1 TABLET (5 MG TOTAL) BY MOUTH DAILY.   tadalafil 20 MG tablet Commonly known as: CIALIS TAKE ONE-HALF TO ONE TABLET BY MOUTH EVERY OTHER DAY AS NEEDED FOR ERECTILE DYSFUNCTION   TESTOSTERONE NA Take 3 tablets by mouth daily. Nugenix   venlafaxine XR 75 MG 24 hr capsule Commonly known as: EFFEXOR-XR Take 3 capsules (225 mg total) by mouth every morning.   VITAMIN B-3 PO Take 1 tablet by mouth daily.        Allergies:  Allergies  Allergen Reactions   Belsomra [Suvorexant] Other (See Comments)    Hallucinations   Morphine And Related Hives and Nausea And Vomiting     Dry heaves and hives   Duloxetine Other (See Comments)    Nervous   Meloxicam Other (See Comments)    Stomach cramps   Other Other (See Comments)    CBD oil - felt anxious    Family History: Family History  Problem Relation Age of Onset   Alzheimer's disease Mother    Stroke Mother    Heart disease Father    Congestive Heart Failure Brother    CAD Brother    Congestive Heart Failure Sister     Social History:  reports that he has never smoked. He has never used smokeless  tobacco. He reports current alcohol use. He reports that he does not use drugs.   Physical Exam: Ht 5\' 11"  (1.803 m)   Wt 180 lb (81.6 kg)   BMI 25.10 kg/m   Constitutional:  Alert and oriented, No acute distress. HEENT: [place] AT, moist mucus membranes.  Trachea midline, no masses. Cardiovascular: No clubbing, cyanosis, or edema. Respiratory: Normal respiratory effort, no increased work of breathing. Skin: No rashes, bruises or suspicious lesions. Neurologic: Grossly intact, no focal deficits, moving all 4 extremities. Psychiatric: Normal mood and affect.  Laboratory Data:  Lab Results  Component Value Date   CREATININE 0.69 05/31/2021    Lab Results  Component Value Date   PSA 0.8 04/25/2017   PSA 0.4 03/15/2012    Lab Results  Component Value Date   TESTOSTERONE 324 11/03/2020    Assessment & Plan:    Urinary retention  - Fill and void trial today - successful voiding 150 cc which was instilled  - He will return later today for a PVR  -will need prostate cancer screening at f/u  -etiology unclear, may be pain/ medication related vs. Underlying BPH  2. Gross hematuria  - Likely mechanical secondary to foley trauma  - Discussed further evaluation of the through cystoscopy to review anatomy of bladder and prostate. We discussed the procedure today.   - Recommend RUS due to family history of kidney issues   - RUS; Scheduled   Return for cystoscopy and follow-up RUS / PSA /  DRE  I,Kailey Littlejohn,acting as a scribe for Hollice Espy, MD.,have documented all relevant documentation on the behalf of Hollice Espy, MD,as directed by  Hollice Espy, MD while in the presence of Hollice Espy, MD.  I have reviewed the above documentation for accuracy and completeness, and I agree with the above.   Hollice Espy, MD  [facility] [address] [place], [place] [address] [phone]

## 2021-06-09 ENCOUNTER — Other Ambulatory Visit: Payer: Self-pay

## 2021-06-09 ENCOUNTER — Ambulatory Visit: Payer: Medicare Other | Admitting: Urology

## 2021-06-09 ENCOUNTER — Ambulatory Visit (INDEPENDENT_AMBULATORY_CARE_PROVIDER_SITE_OTHER): Payer: Medicare Other | Admitting: Urology

## 2021-06-09 VITALS — Ht 71.0 in | Wt 180.0 lb

## 2021-06-09 DIAGNOSIS — R310 Gross hematuria: Secondary | ICD-10-CM

## 2021-06-09 LAB — BLADDER SCAN AMB NON-IMAGING: Scan Result: 0

## 2021-06-09 NOTE — Patient Instructions (Signed)

## 2021-06-09 NOTE — Progress Notes (Signed)
Fill and Pull Catheter Removal  Patient is present today for a catheter removal.  Patient was cleaned and prepped in a sterile fashion 120 ml of sterile water/ saline was instilled into the bladder when the patient felt the urge to urinate. 16ml of water was then drained from the balloon.  A 16FR foley cath was removed from the bladder no complications were noted .  Patient as then given some time to void on their own.  Patient can void  160 ml on their own after some time.  Patient tolerated well.  Performed by: Lucia Bitter  Follow up/ Additional notes: afternoon for pvr. 3-4 weeks cysto/rus

## 2021-06-14 ENCOUNTER — Ambulatory Visit (INDEPENDENT_AMBULATORY_CARE_PROVIDER_SITE_OTHER): Payer: Medicare Other

## 2021-06-14 DIAGNOSIS — Z0000 Encounter for general adult medical examination without abnormal findings: Secondary | ICD-10-CM

## 2021-06-14 DIAGNOSIS — S42001A Fracture of unspecified part of right clavicle, initial encounter for closed fracture: Secondary | ICD-10-CM

## 2021-06-14 NOTE — Progress Notes (Signed)
Virtual Visit via Telephone Note  I connected with  Dylan [name] on 06/14/21 at  3:00 PM EST by telephone and verified that I am speaking with the correct person using two identifiers.  Location: Patient: home  Provider: [facility] Persons participating in the virtual visit: [name]   I discussed the limitations, risks, security and privacy concerns of performing an evaluation and management service by telephone and the availability of in person appointments. The patient expressed understanding and agreed to proceed.  Interactive audio and video telecommunications were attempted between this nurse and patient, however failed, due to patient having technical difficulties OR patient did not have access to video capability.  We continued and completed visit with audio only.  Some vital signs may be absent or patient reported.   Hal Hope, LPN   Subjective:   Dylan [name] is a 65 y.o. male who presents for Medicare Annual/Subsequent preventive examination.  Review of Systems           Objective:    Today's Vitals   06/14/21 1508  PainSc: 5    There is no height or weight on file to calculate BMI.  Advanced Directives 06/14/2021 05/31/2021 06/03/2020 05/29/2019 05/24/2018 07/05/2017 04/20/2016  Does Patient Have a Medical Advance Directive? No No Yes No No No No  Type of Advance Directive - Web designer;Living will - - - -  Copy of Healthcare Power of Attorney in Chart? - - No - copy requested - - - -  Would patient like information on creating a medical advance directive? No - Patient declined No - Patient declined - No - Patient declined No - Patient declined No - Patient declined -  Pre-existing out of facility DNR order (yellow form or pink MOST form) - - - - - Yellow form placed in chart (order not valid for inpatient use) -    Current Medications (verified) Outpatient Encounter Medications as of 06/14/2021  Medication Sig   amLODipine  (NORVASC) 5 MG tablet TAKE 1 TABLET(5 MG) BY MOUTH EVERY EVENING   aspirin 81 MG tablet Take 81 mg by mouth [name]. Reported on 01/01/2016   cyclobenzaprine (FLEXERIL) 10 MG tablet TAKE 1 TABLET BY MOUTH 3 TIMES [name] AS NEEDED FOR MUSCLE SPASM(S)   diclofenac (VOLTAREN) 50 MG EC tablet Take 1 tablet (50 mg total) by mouth 2 (two) times [name]. Take with food   fluticasone (FLONASE) 50 MCG/ACT nasal spray Place 2 sprays into both nostrils [name].   HYDROcodone-acetaminophen (NORCO) 10-325 MG tablet Take 1-2 tablets by mouth every 6 (six) hours as needed for severe pain.   lidocaine (LIDODERM) 5 % Place 1 patch onto the skin every 12 (twelve) hours. Remove & Discard patch within 12 hours or as directed by MD   LORazepam (ATIVAN) 1 MG tablet Take 1 tablet (1 mg total) by mouth every 6 (six) hours as needed. for anxiety   Melatonin 3 MG CAPS Take 3 mg by mouth as needed.    naproxen (NAPROSYN) 500 MG tablet TAKE 1 TABLET BY MOUTH TWO  TIMES [name] WITH A MEAL   naproxen sodium (ALEVE) 220 MG tablet Take 220 mg by mouth [name] as needed.   Niacin (VITAMIN B-3 PO) Take 1 tablet by mouth [name].   omeprazole (PRILOSEC) 40 MG capsule TAKE 1 CAPSULE(40 MG) BY MOUTH [name]   OVER THE COUNTER MEDICATION CEREBRA 2 TABLETS [name]   rosuvastatin (CRESTOR) 5 MG tablet TAKE 1 TABLET (5 MG TOTAL) BY MOUTH [name].  tadalafil (CIALIS) 20 MG tablet TAKE ONE-HALF TO ONE TABLET BY MOUTH EVERY OTHER DAY AS NEEDED FOR ERECTILE DYSFUNCTION   TESTOSTERONE NA Take 3 tablets by mouth [name]. Nugenix   venlafaxine XR (EFFEXOR-XR) 75 MG 24 hr capsule Take 3 capsules (225 mg total) by mouth every morning.   [DISCONTINUED] diphenhydramine-acetaminophen (TYLENOL PM) 25-500 MG TABS Take 1 tablet by mouth at bedtime as needed (pain). Reported on 01/01/2016   [DISCONTINUED] fluticasone (FLONASE) 50 MCG/ACT nasal spray Place 1 spray into both nostrils as needed.    No facility-administered encounter medications on file as of 06/14/2021.     Allergies (verified) Belsomra [suvorexant], Morphine and related, Duloxetine, Meloxicam, and Other   History: Past Medical History:  Diagnosis Date   History of chicken pox    History of measles as a child    History of mumps as a child    Hyperlipidemia    Hypertension    Seizures (HCC)    blacked out in shower when coming off benzo - 'withdrawals'   Shortness of breath dyspnea    with exertion ? d/t stress & anxiety   Past Surgical History:  Procedure Laterality Date   COLONOSCOPY WITH PROPOFOL N/A 07/05/2017   Procedure: COLONOSCOPY WITH PROPOFOL;  Surgeon: Wyline Mood, MD;  Location: [facility] ENDOSCOPY;  Service: Gastroenterology;  Laterality: N/A;   CT SCAN     JOINT REPLACEMENT     KNEE SURGERY Right 1993   LUMBAR DISC SURGERY  03/18/2015   R L4-5 micro discectomy, Dr. Steele Berg LAMINECTOMY/DECOMPRESSION MICRODISCECTOMY Right 03/18/2015   Procedure: Right Lumbar Four-Five Microdiscectomy;  Surgeon: Tressie Stalker, MD;  Location: [facility] NEURO ORS;  Service: Neurosurgery;  Laterality: Right;  Right L45 microdiskectomy   LUNG SURGERY Left 2009   Lung Collapse: left chest tube   ROTATOR CUFF REPAIR  2004   had rotator cuff injury and surgery was performed   TONSILLECTOMY     TONSILLECTOMY AND ADENOIDECTOMY  1960   Family History  Problem Relation Age of Onset   Alzheimer's disease Mother    Stroke Mother    Heart disease Father    Congestive Heart Failure Brother    CAD Brother    Congestive Heart Failure Sister    Social History   Socioeconomic History   Marital status: Divorced    Spouse name: Not on file   Number of children: 3   Years of education: Not on file   Highest education level: High school graduate  Occupational History   Occupation: disability  Tobacco Use   Smoking status: Never   Smokeless tobacco: Never  Vaping Use   Vaping Use: Never used  Substance and Sexual Activity   Alcohol use: Yes    Comment: 1 beer on holidays   Drug use:  No   Sexual activity: Yes  Other Topics Concern   Not on file  Social History Narrative   ** Merged History Encounter **       Social Determinants of Health   Financial Resource Strain: Low Risk    Difficulty of Paying Living Expenses: Not very hard  Food Insecurity: No Food Insecurity   Worried About Programme researcher, broadcasting/film/video in the Last Year: Never true   Ran Out of Food in the Last Year: Never true  Transportation Needs: No Transportation Needs   Lack of Transportation (Medical): No   Lack of Transportation (Non-Medical): No  Physical Activity: Insufficiently Active   Days of Exercise per Week: 2 days  Minutes of Exercise per Session: 20 min  Stress: No Stress Concern Present   Feeling of Stress : Only a little  Social Connections: Socially Isolated   Frequency of Communication with Friends and Family: More than three times a week   Frequency of Social Gatherings with Friends and Family: Twice a week   Attends Religious Services: Never   Diplomatic Services operational officer: No   Attends Engineer, structural: Never   Marital Status: Divorced    Tobacco Counseling Counseling given: Not Answered   Clinical Intake:  Pre-visit preparation completed: Yes  Pain : 0-10 Pain Score: 5  Pain Type: Chronic pain Pain Location: Leg Pain Orientation: Left, Distal, Upper, Mid, Lower Pain Radiating Towards: foot Pain Descriptors / Indicators: Radiating Pain Onset: More than a month ago Pain Frequency: Constant     Diabetes: No  How often do you need to have someone help you when you read instructions, pamphlets, or other written materials from your doctor or pharmacy?: 1 - Never  Diabetic? no  Interpreter Needed?: No  Information entered by :: Kennedy Bucker, LPN   Activities of [name] Living In your present state of health, do you have any difficulty performing the following activities: 05/17/2021  Hearing? Y  Vision? N  Difficulty concentrating or making  decisions? N  Walking or climbing stairs? Y  Dressing or bathing? N  Doing errands, shopping? N  Some recent data might be hidden    Patient Care Team: Malva Limes, MD as PCP - General (Family Medicine) Tressie Stalker, MD as Consulting Physician (Neurosurgery)  Indicate any recent Medical Services you may have received from other than [facility] providers in the past year (date may be approximate).     Assessment:   This is a routine wellness examination for Dylan [name].  Hearing/Vision screen No results found.  Dietary issues and exercise activities discussed:     Goals Addressed             This Visit's Progress    DIET - EAT MORE FRUITS AND VEGETABLES       Eat healthier       Depression Screen PHQ 2/9 Scores 06/14/2021 05/17/2021 10/23/2020 06/03/2020 05/29/2019 12/25/2018 05/24/2018  PHQ - 2 Score 2 6 5 2 5 4 5   PHQ- 9 Score 5 24 12 11 20 13 21     Fall Risk Fall Risk  06/14/2021 05/17/2021 06/03/2020 08/14/2019 05/29/2019  Falls in the past year? - 1 1 1 1   Comment - - Accidental falls due to dog. - -  Number falls in past yr: 1 1 1 1 1   Injury with Fall? 0 1 0 1 0  Risk for fall due to : History of fall(s) History of fall(s);Impaired mobility - - -  Risk for fall due to: Comment - - - - -  Follow up Falls prevention discussed Falls evaluation completed Falls prevention discussed - Falls prevention discussed    FALL RISK PREVENTION PERTAINING TO THE HOME:  Any stairs in or around the home? Yes  If so, are there any without handrails? No  Home free of loose throw rugs in walkways, pet beds, electrical cords, etc? Yes  Adequate lighting in your home to reduce risk of falls? Yes   ASSISTIVE DEVICES UTILIZED TO PREVENT FALLS:  Life alert? No  Use of a cane, walker or w/c? No  Grab bars in the bathroom? Yes  Shower chair or bench in shower? No  Elevated toilet seat or  a handicapped toilet? No    Cognitive Function: Normal cognitive status assessed by direct  observation by this Nurse Health Advisor. No abnormalities found.       6CIT Screen 05/24/2018  What Year? 0 points  What month? 0 points  What time? 0 points  Count back from 20 0 points  Months in reverse 0 points  Repeat phrase 2 points  Total Score 2    Immunizations Immunization History  Administered Date(s) Administered   Fluad Quad(high Dose 65+) 05/17/2021   Influenza Split 04/07/2008   Influenza,inj,Quad PF,6+ Mos 04/05/2013, 04/15/2014, 04/01/2015, 04/20/2016, 04/25/2017, 03/23/2018, 03/13/2019   Moderna Sars-Covid-2 Vaccination 11/03/2020   Tdap 09/12/2008   Zoster, Live 05/06/2016    TDAP status: Due, Education has been provided regarding the importance of this vaccine. Advised may receive this vaccine at local pharmacy or Health Dept. Aware to provide a copy of the vaccination record if obtained from local pharmacy or Health Dept. Verbalized acceptance and understanding.  Flu Vaccine status: Up to date  Pneumococcal vaccine status: Declined,  Education has been provided regarding the importance of this vaccine but patient still declined. Advised may receive this vaccine at local pharmacy or Health Dept. Aware to provide a copy of the vaccination record if obtained from local pharmacy or Health Dept. Verbalized acceptance and understanding.   Covid-19 vaccine status: Declined, Education has been provided regarding the importance of this vaccine but patient still declined. Advised may receive this vaccine at local pharmacy or Health Dept.or vaccine clinic. Aware to provide a copy of the vaccination record if obtained from local pharmacy or Health Dept. Verbalized acceptance and understanding.  Qualifies for Shingles Vaccine? No   Zostavax completed Yes   Shingrix Completed?: No.    Education has been provided regarding the importance of this vaccine. Patient has been advised to call insurance company to determine out of pocket expense if they have not yet received this  vaccine. Advised may also receive vaccine at local pharmacy or Health Dept. Verbalized acceptance and understanding.  Screening Tests Health Maintenance  Topic Date Due   Zoster Vaccines- Shingrix (1 of 2) Never done   TETANUS/TDAP  09/12/2018   Pneumonia Vaccine 44+ Years old (1 - PCV) Never done   COVID-19 Vaccine (2 - Moderna series) 12/01/2020   COLONOSCOPY (Pts 45-46yrs Insurance coverage will need to be confirmed)  07/06/2027   INFLUENZA VACCINE  Completed   Hepatitis C Screening  Completed   HIV Screening  Completed   HPV VACCINES  Aged Out    Health Maintenance  Health Maintenance Due  Topic Date Due   Zoster Vaccines- Shingrix (1 of 2) Never done   TETANUS/TDAP  09/12/2018   Pneumonia Vaccine 10+ Years old (1 - PCV) Never done   COVID-19 Vaccine (2 - Moderna series) 12/01/2020    Colorectal cancer screening: Type of screening: Colonoscopy. Completed 07/05/17. Repeat every 10 years  Lung Cancer Screening: (Low Dose CT Chest recommended if Age 20-80 years, 30 pack-year currently smoking OR have quit w/in 15years.) does not qualify.    Additional Screening:  Hepatitis C Screening: does qualify; Completed 03/15/12  Vision Screening: Recommended annual ophthalmology exams for early detection of glaucoma and other disorders of the eye. Is the patient up to date with their annual eye exam?  No  Who is the provider or what is the name of the office in which the patient attends annual eye exams? Sees no one If pt is not established with a provider, would  they like to be referred to a provider to establish care? No .   Dental Screening: Recommended annual dental exams for proper oral hygiene  Community Resource Referral / Chronic Care Management: CRR required this visit?  Yes   CCM required this visit?  No      Plan:     I have personally reviewed and noted the following in the patient's chart:   Medical and social history Use of alcohol, tobacco or illicit drugs   Current medications and supplements including opioid prescriptions. Patient is currently taking opioid prescriptions. Information provided to patient regarding non-opioid alternatives. Patient advised to discuss non-opioid treatment plan with their provider. Functional ability and status Nutritional status Physical activity Advanced directives List of other physicians Hospitalizations, surgeries, and ER visits in previous 12 months Vitals Screenings to include cognitive, depression, and falls Referrals and appointments  In addition, I have reviewed and discussed with patient certain preventive protocols, quality metrics, and best practice recommendations. A written personalized care plan for preventive services as well as general preventive health recommendations were provided to patient.     Hal Hope, LPN   07/06/7587   Nurse Notes:

## 2021-06-15 ENCOUNTER — Telehealth: Payer: Self-pay | Admitting: *Deleted

## 2021-06-15 NOTE — Chronic Care Management (AMB) (Signed)
  Chronic Care Management   Outreach Note  06/15/2021 Name: [name] MRN: [id number] DOB: [birth year]  [name] is a 65 y.o. year old male who is a primary care patient of Sherrie Mustache, Demetrios Isaacs, MD. I reached out to Irving Copas by phone today in response to a referral sent by Mr. Khole Goetzke's primary care provider.  An unsuccessful telephone outreach was attempted today. The patient was referred to the case management team for assistance with care management and care coordination.   Follow Up Plan: A HIPAA compliant phone message was left for the patient providing contact information and requesting a return call.  If patient returns call to provider office, please advise to call Embedded Care Management Care Guide Mykhia Danish at [phone]  Burman Nieves, CCMA Care Guide, Embedded Care Coordination [facility]  Care Management  Direct Dial: [phone]

## 2021-06-16 ENCOUNTER — Other Ambulatory Visit: Payer: Self-pay

## 2021-06-16 ENCOUNTER — Ambulatory Visit (INDEPENDENT_AMBULATORY_CARE_PROVIDER_SITE_OTHER): Payer: Medicare Other | Admitting: Family Medicine

## 2021-06-16 ENCOUNTER — Encounter: Payer: Self-pay | Admitting: Family Medicine

## 2021-06-16 VITALS — BP 130/91 | HR 116 | Temp 98.6°F | Wt 178.0 lb

## 2021-06-16 DIAGNOSIS — G8929 Other chronic pain: Secondary | ICD-10-CM

## 2021-06-16 DIAGNOSIS — M5126 Other intervertebral disc displacement, lumbar region: Secondary | ICD-10-CM | POA: Diagnosis not present

## 2021-06-16 MED ORDER — HYDROCODONE-ACETAMINOPHEN 10-325 MG PO TABS: 1.0000 | ORAL_TABLET | Freq: Four times a day (QID) | ORAL | 0 refills | Status: DC | PRN

## 2021-06-16 MED ORDER — GABAPENTIN 300 MG PO CAPS: 300.0000 mg | ORAL_CAPSULE | Freq: Every day | ORAL | 1 refills | Status: DC

## 2021-06-16 MED ORDER — CELECOXIB 100 MG PO CAPS: 100.0000 mg | ORAL_CAPSULE | Freq: Two times a day (BID) | ORAL | 1 refills | Status: DC

## 2021-06-16 NOTE — Progress Notes (Signed)
Established patient visit   Patient: Dylan [name]   DOB: Sep 03, 1955   65 y.o. Male  MRN: [id number] Visit Date: 06/16/2021  Today's healthcare provider: [name], MD   Chief Complaint  Patient presents with   Follow-up   Subjective    HPI  Follow up ER visit  Patient was seen in ER for rib injury  on 05/31/2021. He was treated for rib pain on right side after a fall. He had xrays of ribs showing only chronic bilateral deformities.  Treatment for this included giving him a short prescription of his chronic pain medication to to get by until he is able to see his PCP.       He reports excellent compliance with treatment. He reports this condition is Unchanged.  He had previously been at [facility] urgent care for UTI/urinary retention and back pain on 05/25/2021. He had MRI of lumbar and thoracic spinewith chronic changes  Thoracic Spine w/o contrast FINDINGS:  Bone marrow signal intensity is normal. C5 vertebral body hemangioma. Normal signal in the spinal cord.   The vertebral bodies are normally aligned. Chronic anterior wedging of the T7 and T8 vertebral bodies. Multilevel intervertebral disc space narrowing, disc desiccation, and disc height loss. No significant spinal canal or neural foraminal narrowing.    Lumbar spine without contrast FINDINGS:   Bone marrow signal intensity is normal. The visualized cord is unremarkable, and the conus medullaris ends at a normal level.   The vertebral bodies are normally aligned. Multilevel disc desiccation, disc height loss, and degenerative endplate changes.   L1-L2: Diffuse disc bulge with superimposed left subarticular disc protrusion No herniation. No spinal canal narrowing. No neural foraminal narrowing.   L2-L3: Diffuse disc bulge. No spinal canal narrowing. No neural foraminal narrowing.   L3-L4: Diffuse disc bulge with moderate spinal canal narrowing. Right greater than left facet hypertrophy with mild right neural  foraminal narrowing.   L4-L5: Sequela of right laminectomy and microdiscectomy. Diffuse disc bulge with mild spinal canal narrowing. Bilateral facet arthropathy and ligamentum flavum thickening with mild left neural foraminal narrowing.   L5-S1: Diffuse disc bulge. Left greater than right facet hypertrophy and ligamentum flavum thickening with moderate left neural foraminal narrowing.   The paraspinal tissues are within normal limits.   For the purposes of this dictation, the lowest well-formed intervertebral disc space is assumed to be the L5-S1 level, and there are presumed to be five lumbar-type vertebral bodies.   He was discharged from [facility] ER with Foley due to urinary retention. Had follow up Dr. Apolinar Junes (urology) on 1123/2022 for Foley removal and has another follow up scheduled 07/06/2021 for cystoscopy.   -----------------------------------------------------------------------------------------     Medications: Outpatient Medications Prior to Visit  Medication Sig   amLODipine (NORVASC) 5 MG tablet TAKE 1 TABLET(5 MG) BY MOUTH EVERY EVENING   aspirin 81 MG tablet Take 81 mg by mouth daily. Reported on 01/01/2016   cyclobenzaprine (FLEXERIL) 10 MG tablet TAKE 1 TABLET BY MOUTH 3 TIMES DAILY AS NEEDED FOR MUSCLE SPASM(S)   diclofenac (VOLTAREN) 50 MG EC tablet Take 1 tablet (50 mg total) by mouth 2 (two) times daily. Take with food   fluticasone (FLONASE) 50 MCG/ACT nasal spray Place 2 sprays into both nostrils daily.   HYDROcodone-acetaminophen (NORCO) 10-325 MG tablet Take 1-2 tablets by mouth every 6 (six) hours as needed for severe pain.   lidocaine (LIDODERM) 5 % Place 1 patch onto the skin every 12 (twelve) hours. Remove &  Discard patch within 12 hours or as directed by MD   LORazepam (ATIVAN) 1 MG tablet Take 1 tablet (1 mg total) by mouth every 6 (six) hours as needed. for anxiety   Melatonin 3 MG CAPS Take 3 mg by mouth as needed.    naproxen (NAPROSYN) 500 MG tablet TAKE 1  TABLET BY MOUTH TWO  TIMES DAILY WITH A MEAL   naproxen sodium (ALEVE) 220 MG tablet Take 220 mg by mouth daily as needed.   Niacin (VITAMIN B-3 PO) Take 1 tablet by mouth daily.   omeprazole (PRILOSEC) 40 MG capsule TAKE 1 CAPSULE(40 MG) BY MOUTH DAILY   OVER THE COUNTER MEDICATION CEREBRA 2 TABLETS DAILY   rosuvastatin (CRESTOR) 5 MG tablet TAKE 1 TABLET (5 MG TOTAL) BY MOUTH DAILY.   tadalafil (CIALIS) 20 MG tablet TAKE ONE-HALF TO ONE TABLET BY MOUTH EVERY OTHER DAY AS NEEDED FOR ERECTILE DYSFUNCTION   TESTOSTERONE NA Take 3 tablets by mouth daily. Nugenix   venlafaxine XR (EFFEXOR-XR) 75 MG 24 hr capsule Take 3 capsules (225 mg total) by mouth every morning.   No facility-administered medications prior to visit.    Review of Systems  Constitutional: Negative.   Respiratory: Negative.    Gastrointestinal: Negative.   Endocrine: Positive for heat intolerance.  Neurological:  Negative for dizziness, light-headedness and headaches.      Objective    BP (!) 130/91 (BP Location: Right Arm, Patient Position: Sitting, Cuff Size: Large)   Pulse (!) 116   Temp 98.6 F (37 C) (Oral)   Wt 178 lb (80.7 kg)   SpO2 100%   BMI 24.83 kg/m    Physical Exam  Awake, alert, oriented x 3. In no apparent distress    Assessment & Plan     1. Lumbar herniated disc   2. Other chronic pain  Previously followed by Dr. Lovell Sheehan. He has also been referred to pain clinic on a few occasions but did not follow up.   Will add gabapentin (NEURONTIN) 300 MG capsule; Take 1 capsule (300 mg total) by mouth at bedtime. For sciatic and nerve pain  Dispense: 30 capsule; Refill: 1  He is currently taking up to 800mg  ibuprofen and having more trouble with upset stomach. Was prescribed meloxicam in the past which he stopped due to stomach upset will try celecoxib (CELEBREX) 100 MG capsule; Take 1 capsule (100 mg total) by mouth 2 (two) times daily. For back pain  Dispense: 60 capsule; Refill: 1   Follow up  regarding pain medications in 1 month.       The entirety of the information documented in the History of Present Illness, Review of Systems and Physical Exam were personally obtained by me. Portions of this information were initially documented by the CMA and reviewed by me for thoroughness and accuracy.     , MD  [facility] [phone] (phone) [phone] (fax)  [facility]

## 2021-06-21 ENCOUNTER — Telehealth: Payer: Self-pay

## 2021-06-21 NOTE — Chronic Care Management (AMB) (Signed)
  Chronic Care Management   Note  06/21/2021 Name: Dylan [name] MRN: [id number] DOB: February 22, 1956  Dylan [name] is a 65 y.o. year old male who is a primary care patient of Caryn Section, Kirstie Peri, MD. I reached out to Westley Chandler by phone today in response to a referral sent by Dylan [name]'s PCP.  Dylan [name] was given information about Chronic Care Management services today including:  CCM service includes personalized support from designated clinical staff supervised by his physician, including individualized plan of care and coordination with other care providers 24/7 contact phone numbers for assistance for urgent and routine care needs. Service will only be billed when office clinical staff spend 20 minutes or more in a month to coordinate care. Only one practitioner may furnish and bill the service in a calendar month. The patient may stop CCM services at any time (effective at the end of the month) by phone call to the office staff. The patient is responsible for co-pay (up to 20% after annual deductible is met) if co-pay is required by the individual health plan.   Patient agreed to services and verbal consent obtained.   Follow up plan: Telephone appointment with care management team member scheduled for: 06/24/2021  Dylan [name], [place] Management  Direct Dial: [phone]

## 2021-06-21 NOTE — Telephone Encounter (Signed)
Copied from CRM [id number]. Topic: General - Other >> Jun 18, 2021  4:56 PM Pawlus, Maxine Glenn A wrote: Reason for CRM: Pt stated he needs copies of the images from his scan sent to his [facility], Dr Lovell Sheehan, please advise.

## 2021-06-21 NOTE — Telephone Encounter (Signed)
Patient returning call back about getting copy of his MRI. Please call back

## 2021-06-21 NOTE — Chronic Care Management (AMB) (Signed)
  Chronic Care Management   Outreach Note  06/21/2021 Name: Dylan [name] MRN: [id number] DOB: Mar 24, 1956  Dylan [name] is a 65 y.o. year old male who is a primary care patient of Sherrie Mustache, Demetrios Isaacs, MD. I reached out to Dylan [name] by phone today in response to a referral sent by Dylan [name]'s primary care provider.  A second unsuccessful telephone outreach was attempted today. The patient was referred to the case management team for assistance with care management and care coordination.   Follow Up Plan: A HIPAA compliant phone message was left for the patient providing contact information and requesting a return call.  If patient returns call to provider office, please advise to call Embedded Care Management Care Guide Merissa Renwick at [phone]  Burman Nieves, CCMA Care Guide, Embedded Care Coordination Dylan [name]  Care Management  Direct Dial: [phone]

## 2021-06-22 ENCOUNTER — Ambulatory Visit
Admission: RE | Admit: 2021-06-22 | Discharge: 2021-06-22 | Disposition: A | Discharge status: Home / Self Care | Payer: Medicare Other | Source: Ambulatory Visit | Attending: Urology | Admitting: Urology

## 2021-06-22 ENCOUNTER — Other Ambulatory Visit: Payer: Self-pay

## 2021-06-22 DIAGNOSIS — R310 Gross hematuria: Secondary | ICD-10-CM | POA: Diagnosis not present

## 2021-06-22 DIAGNOSIS — R319 Hematuria, unspecified: Secondary | ICD-10-CM | POA: Diagnosis not present

## 2021-06-22 NOTE — Telephone Encounter (Signed)
LVM Called pt to inform him he need to sign a ROI .Dylan [name]

## 2021-06-22 NOTE — Telephone Encounter (Signed)
This needs to be handled by medical records. A signed ROI needs to be in place before records are sent to another facility.

## 2021-06-22 NOTE — Telephone Encounter (Signed)
Pt called saying he has an appt Thursday at the [facility] his neurologist.  He needs it sent by his appt date.  CB#  [phone]

## 2021-06-24 ENCOUNTER — Ambulatory Visit: Payer: Medicare Other | Admitting: *Deleted

## 2021-06-24 DIAGNOSIS — F32A Depression, unspecified: Secondary | ICD-10-CM

## 2021-06-24 DIAGNOSIS — M5126 Other intervertebral disc displacement, lumbar region: Secondary | ICD-10-CM

## 2021-06-24 DIAGNOSIS — F410 Panic disorder [episodic paroxysmal anxiety] without agoraphobia: Secondary | ICD-10-CM

## 2021-06-24 DIAGNOSIS — F419 Anxiety disorder, unspecified: Secondary | ICD-10-CM

## 2021-06-24 DIAGNOSIS — G8929 Other chronic pain: Secondary | ICD-10-CM
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
## 2021-06-25 NOTE — Patient Instructions (Signed)
Visit Information  Thank you for taking time to visit with me today. Please don't hesitate to contact me if I can be of assistance to you before our next scheduled telephone appointment.  Following are the goals we discussed today:  - begin personal counseling - talk about feelings with a friend, family or spiritual advisor - practice positive thinking and self-talk  Our next appointment is by telephone on 07/06/21 at 1pm  Please call the care guide team at [phone] if you need to cancel or reschedule your appointment.   If you are experiencing a Mental Health or Behavioral Health Crisis or need someone to talk to, please call the Suicide and Crisis Lifeline: 60   Following is a copy of your full plan of care:  Care Plan : Anxiety (Adult)  Updates made by Wenda Overland, LCSW since 06/25/2021 12:00 AM     Problem: Symptoms (Anxiety)      Goal: Anxiety Symptoms Monitored and Managed   Start Date: 06/24/2021  Priority: High  Note:   CARE PLAN ENTRY (see longitudinal plan of care for additional care plan information)  Current Barriers:  Knowledge deficits related to accessing mental health provider in patient with Anxiety and Depression  Patient is experiencing symptoms of  Anxiety, Depression(low ambition) which seem to be exacerbated by chronic back pain and family conflicts-per patient, current coping strategies are not working Patient needs Support, Education, and Care Coordination in order to meet unmet mental health needs  Limited social support, ADL IADL limitations, and Mental Health Concerns   Clinical Social Work Goal(s):  Over the next 90 days, patient will work with SW bi-weekly by telephone or in person to reduce or manage symptoms of anxiety and depression until connected for ongoing counseling resources.  Patient will implement clinical interventions discussed today to decreases symptoms of anxiety and depression and increase knowledge and/or ability of: coping  skills and self-management skills.  Interventions:  Assessed patient's understanding, education, previous treatment and care coordination needs  Patient interviewed and appropriate assessments performed: PHQ 2 PHQ 9 Provided basic mental health support, education and interventions related to family conflicts and current medical condition-positive coping strategies discussed Patient confirmed that he is currently seeing a Neurologist-considering back surgery-appointment today was missed -appointment will reschedule for next week Discussed several options for long term counseling based on need and insurance.  Encouraged ongoing mental health follow up Other interventions include: Motivational Interviewing employed Depression screen reviewed  Active listening / Reflection utilized  Surveyor, mining care emphasized  Patient agreeable to referral to [facility] for ongoing mental health counseling  Patient Self Care Activities & Deficits:  Patient is unable to independently navigate community resource options without care coordination support Patient is able to implement clinical interventions discussed today and is motivated for treatment  Performs ADL's independently Performs IADL's independently Independent living  Initial goal documentation      Dylan [name] was given information about Care Management services by the embedded care coordination team including:  Care Management services include personalized support from designated clinical staff supervised by his physician, including individualized plan of care and coordination with other care providers 24/7 contact phone numbers for assistance for urgent and routine care needs. The patient may stop CCM services at any time (effective at the end of the month) by phone call to the office staff.  Patient agreed to services and verbal consent obtained.   The patient verbalized understanding of instructions,  educational materials, and care plan  provided today and declined offer to receive copy of patient instructions, educational materials, and care plan.   Telephone follow up appointment with care management team member scheduled for:  Verna Czech, [place] Clinical Social Worker  [facility] Care Management [phone]

## 2021-06-28 NOTE — Progress Notes (Signed)
Patient was seen this am for fill and pull after having a Foley catheter placed by the ED on May 26, 2021.  He successfully voided this morning after his fill and pull.  He states he has been urinating well today.  His PVR is 0 mL.  He is scheduled for a RUS/PSA and then a cysto on 07/06/2021 for gross heme.  He is instructed to keep that appointment.

## 2021-07-02 ENCOUNTER — Other Ambulatory Visit: Payer: Self-pay | Admitting: Family Medicine

## 2021-07-02 DIAGNOSIS — M48062 Spinal stenosis, lumbar region with neurogenic claudication: Secondary | ICD-10-CM | POA: Diagnosis not present

## 2021-07-02 DIAGNOSIS — M5126 Other intervertebral disc displacement, lumbar region: Secondary | ICD-10-CM

## 2021-07-04 ENCOUNTER — Other Ambulatory Visit: Payer: Self-pay | Admitting: Family Medicine

## 2021-07-04 DIAGNOSIS — F410 Panic disorder [episodic paroxysmal anxiety] without agoraphobia: Secondary | ICD-10-CM

## 2021-07-04 DIAGNOSIS — F430 Acute stress reaction: Secondary | ICD-10-CM

## 2021-07-04 NOTE — Progress Notes (Signed)
° °  07/04/2021 CC:  Chief Complaint  Patient presents with   Cysto     HPI: Dylan [name] is a 65 y.o. male with a personal history of urinary retention and gross hematuria who presents today for a diagnostic cystoscopy and RUS results.   He initially presented to urology for ER follow-up after urinary retention, he had a Foley catheter placed in the ER on 05/26/2021.  RUS on 06/22/2021 revealed no acute abnormality of the kidneys and no hydronephrosis.   His most recent PSA on 11/03/2020 was 0.4.   He reports his sister had a kidney removed.   \He is doing well today on a urinary standpoint with no new episodes of blood in his urine or any new urinary issues.   Vitals:   07/06/21 0836  BP: (!) 155/101  Pulse: (!) 109  NED. A&Ox3.   No respiratory distress   Abd soft, NT, ND Normal phallus with bilateral descended testicles  Cystoscopy Procedure Note  Patient identification was confirmed, informed consent was obtained, and patient was prepped using Betadine solution.  Lidocaine jelly was administered per urethral meatus.     Pre-Procedure: - Inspection reveals a normal caliber ureteral meatus.  Procedure: The flexible cystoscope was introduced without difficulty - No urethral strictures/lesions are present. - Normal prostate  - Normal bladder neck - Bilateral ureteral orifices identified - Bladder mucosa  reveals no ulcers, tumors, or lesions - No bladder stones - No trabeculation  Retroflexion shows unremarkable  Post-Procedure: - Patient tolerated the procedure well  Assessment/ Plan:  Urinary retention  - Cystoscopy today reassuring and unremarkable  - Likely secondary to his back pain  -Small prostate with low PSA, no evidence of outlet obstruction  2. Gross hematuria  - Cystoscopy was unremarkable today  -RUS negative - Likely secondary to foley catheter trauma.  - he has not had any further blood in his urine  - he seems to be voiding well at this  point we will continue to follow him as needed.  Counseled him that if he has any urinary symptoms that he should return to clinic.    Follow-up as needed   I,Kailey Littlejohn,acting as a scribe for Vanna Scotland, MD.,have documented all relevant documentation on the behalf of Vanna Scotland, MD,as directed by  Vanna Scotland, MD while in the presence of Vanna Scotland, MD.  I have reviewed the above documentation for accuracy and completeness, and I agree with the above.   Vanna Scotland, MD

## 2021-07-05 ENCOUNTER — Other Ambulatory Visit: Payer: Self-pay | Admitting: Family Medicine

## 2021-07-05 ENCOUNTER — Telehealth: Payer: Self-pay

## 2021-07-05 DIAGNOSIS — M5126 Other intervertebral disc displacement, lumbar region: Secondary | ICD-10-CM

## 2021-07-05 MED ORDER — HYDROCODONE-ACETAMINOPHEN 10-325 MG PO TABS: 1.0000 | ORAL_TABLET | Freq: Four times a day (QID) | ORAL | 0 refills | Status: DC | PRN

## 2021-07-05 NOTE — Addendum Note (Signed)
Addended by: Malva Limes on: 07/05/2021 05:20 PM   Modules accepted: Orders

## 2021-07-05 NOTE — Telephone Encounter (Signed)
Copied from CRM [id number]. Topic: General - Other >> Jul 05, 2021  3:01 PM Gaetana Michaelis A wrote: Reason for CRM: The patient would like to speak with a member of clinical staff when possible   The patient would like to know when their prescription for HYDROcodone-acetaminophen (NORCO) 10-325 MG tablet [[id number]] will be signed off on for refill   Please contact further when possible >> Jul 05, 2021  3:03 PM Gaetana Michaelis A wrote: The patient stresses the urgency of their request, they have a procedure at 8:30 AM on 07/06/21

## 2021-07-05 NOTE — Telephone Encounter (Signed)
Copied from CRM [id number]. Topic: Quick Communication - Rx Refill/Question >> Jul 05, 2021 10:24 AM Marylen Ponto wrote: Pt stated he will be going out of town tomorrow and requests Rx to be filled asap  Medication: HYDROcodone-acetaminophen (NORCO) 10-325 MG tablet  Has the patient contacted their pharmacy? No. Pt stated he requested Rx through [facility] (Agent: If no, request that the patient contact the pharmacy for the refill. If patient does not wish to contact the pharmacy document the reason why and proceed with request.) (Agent: If yes, when and what did the pharmacy advise?)  Preferred Pharmacy (with phone number or street name): [id number] - [place], [place] - [address]  Phone:  [phone] Fax:  [phone]  Has the patient been seen for an appointment in the last year OR does the patient have an upcoming appointment? Yes.    Agent: Please be advised that RX refills may take up to 3 business days. We ask that you follow-up with your pharmacy.

## 2021-07-06 ENCOUNTER — Ambulatory Visit: Payer: Medicare Other | Admitting: Urology

## 2021-07-06 ENCOUNTER — Other Ambulatory Visit: Payer: Self-pay

## 2021-07-06 ENCOUNTER — Encounter: Payer: Self-pay | Admitting: Urology

## 2021-07-06 ENCOUNTER — Ambulatory Visit (INDEPENDENT_AMBULATORY_CARE_PROVIDER_SITE_OTHER): Payer: Medicare Other | Admitting: *Deleted

## 2021-07-06 VITALS — BP 155/101 | HR 109 | Ht 71.0 in | Wt 183.0 lb

## 2021-07-06 DIAGNOSIS — F410 Panic disorder [episodic paroxysmal anxiety] without agoraphobia: Secondary | ICD-10-CM

## 2021-07-06 DIAGNOSIS — R339 Retention of urine, unspecified: Secondary | ICD-10-CM | POA: Diagnosis not present

## 2021-07-06 DIAGNOSIS — F32A Depression, unspecified: Secondary | ICD-10-CM

## 2021-07-06 DIAGNOSIS — F419 Anxiety disorder, unspecified: Secondary | ICD-10-CM

## 2021-07-06 DIAGNOSIS — R310 Gross hematuria: Secondary | ICD-10-CM | POA: Diagnosis not present

## 2021-07-06 DIAGNOSIS — M5126 Other intervertebral disc displacement, lumbar region: Secondary | ICD-10-CM

## 2021-07-06 LAB — URINALYSIS, COMPLETE
Bilirubin, UA: NEGATIVE
Glucose, UA: NEGATIVE
Ketones, UA: NEGATIVE
Leukocytes,UA: NEGATIVE
Nitrite, UA: NEGATIVE
Protein,UA: NEGATIVE
Specific Gravity, UA: 1.015 (ref 1.005–1.030)
Urobilinogen, Ur: 0.2 mg/dL (ref 0.2–1.0)
pH, UA: 5.5 (ref 5.0–7.5)

## 2021-07-06 LAB — MICROSCOPIC EXAMINATION: Bacteria, UA: NONE SEEN
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
## 2021-07-06 NOTE — Patient Instructions (Signed)
Visit Information  Thank you for taking time to visit with me today. Please don't hesitate to contact me if I can be of assistance to you before our next scheduled telephone appointment.  Following are the goals we discussed today:  - begin personal counseling - talk about feelings with a friend, family or spiritual advisor - practice positive thinking and self-talk  Our next appointment is by telephone on 07/20/21 at 11am  Please call the care guide team at [phone] if you need to cancel or reschedule your appointment.   If you are experiencing a Mental Health or Behavioral Health Crisis or need someone to talk to, please call the Suicide and Crisis Lifeline: 988   The patient verbalized understanding of instructions, educational materials, and care plan provided today and declined offer to receive copy of patient instructions, educational materials, and care plan.   Telephone follow up appointment with care management team member scheduled for: 07/20/09   Verna Czech, LCSW Clinical Social Worker  [facility] [phone]

## 2021-07-06 NOTE — Telephone Encounter (Signed)
Requested medication (s) are due for refill today: no  Requested medication (s) are on the active medication list: yes  Last refill:  07/05/21 #168  Future visit scheduled: yes  Notes to clinic:  [facility] NT not delegated to refuse this med   Requested Prescriptions  Pending Prescriptions Disp Refills   HYDROcodone-acetaminophen (NORCO) 10-325 MG tablet 168 tablet 0    Sig: Take 1-2 tablets by mouth every 6 (six) hours as needed for severe pain.     Not Delegated - Analgesics:  Opioid Agonist Combinations Failed - 07/06/2021  3:12 PM      Failed - This refill cannot be delegated      Failed - Urine Drug Screen completed in last 360 days      Passed - Valid encounter within last 6 months    Recent Outpatient Visits           2 weeks ago Lumbar herniated disc   [facility] Malva Limes, MD   1 month ago Dysfunction of left eustachian tube   [facility] Malva Limes, MD   8 months ago Dupuytren's contracture of left hand   [facility] Malva Limes, MD   1 year ago Anxiety   [facility] Malva Limes, MD   2 years ago Low back pain potentially associated with radiculopathy   [facility] Malva Limes, MD       Future Appointments             In 2 weeks Fisher, Demetrios Isaacs, MD [facility], [facility]   In 1 month Fisher, Demetrios Isaacs, MD [facility], [facility]

## 2021-07-07 ENCOUNTER — Telehealth: Payer: Self-pay

## 2021-07-07 NOTE — Telephone Encounter (Signed)
Copied from CRM [id number]. Topic: General - Other >> Jul 06, 2021 12:55 PM Gaetana Michaelis A wrote: Reason for CRM: The patient has returned a missed call from the practice  Please contact further when possible

## 2021-07-17 DIAGNOSIS — F32A Depression, unspecified: Secondary | ICD-10-CM

## 2021-07-17 DIAGNOSIS — F419 Anxiety disorder, unspecified: Secondary | ICD-10-CM | POA: Diagnosis not present

## 2021-07-20 ENCOUNTER — Telehealth: Payer: Medicare Other

## 2021-07-21 ENCOUNTER — Other Ambulatory Visit: Payer: Self-pay | Admitting: Family Medicine

## 2021-07-21 DIAGNOSIS — M5126 Other intervertebral disc displacement, lumbar region: Secondary | ICD-10-CM

## 2021-07-21 NOTE — Telephone Encounter (Signed)
LOV:  06/16/2021  NOV:  07/26/2021  Last Refill:  07/05/2021  #168 0 Refills.   Thanks,   -Vernona Rieger
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
## 2021-07-22 ENCOUNTER — Ambulatory Visit (INDEPENDENT_AMBULATORY_CARE_PROVIDER_SITE_OTHER): Payer: Medicare Other | Admitting: *Deleted

## 2021-07-22 DIAGNOSIS — M5126 Other intervertebral disc displacement, lumbar region: Secondary | ICD-10-CM

## 2021-07-22 DIAGNOSIS — F32A Depression, unspecified: Secondary | ICD-10-CM

## 2021-07-22 DIAGNOSIS — F419 Anxiety disorder, unspecified: Secondary | ICD-10-CM

## 2021-07-22 DIAGNOSIS — F410 Panic disorder [episodic paroxysmal anxiety] without agoraphobia: Secondary | ICD-10-CM

## 2021-07-22 MED ORDER — HYDROCODONE-ACETAMINOPHEN 10-325 MG PO TABS: 1.0000 | ORAL_TABLET | Freq: Four times a day (QID) | ORAL | 0 refills | Status: DC | PRN

## 2021-07-22 NOTE — Patient Instructions (Signed)
Visit Information  Thank you for taking time to visit with me today. Please don't hesitate to contact me if I can be of assistance to you before our next scheduled telephone appointment.  Following are the goals we discussed today:   - begin personal counseling - talk about feelings with a friend, family or spiritual advisor - practice positive thinking and self-talk  1pm Our next appointment is by telephone on 08/05/21 at 1pm  Please call the care guide team at [phone] if you need to cancel or reschedule your appointment.   If you are experiencing a Mental Health or Behavioral Health Crisis or need someone to talk to, please call the Suicide and Crisis Lifeline: 988   The patient verbalized understanding of instructions, educational materials, and care plan provided today and declined offer to receive copy of patient instructions, educational materials, and care plan.   Telephone follow up appointment with care management team member scheduled for: 08/05/21   Verna Czech, LCSW Clinical Social Worker  [facility] Care [facility] [phone]
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
## 2021-07-23 ENCOUNTER — Other Ambulatory Visit: Payer: Self-pay | Admitting: Family Medicine

## 2021-07-23 DIAGNOSIS — I10 Essential (primary) hypertension: Secondary | ICD-10-CM

## 2021-07-23 DIAGNOSIS — M5126 Other intervertebral disc displacement, lumbar region: Secondary | ICD-10-CM

## 2021-07-23 DIAGNOSIS — G8929 Other chronic pain: Secondary | ICD-10-CM

## 2021-07-23 NOTE — Telephone Encounter (Signed)
Requested Prescriptions  Pending Prescriptions Disp Refills   gabapentin (NEURONTIN) 300 MG capsule [Pharmacy Med Name: GABAPENTIN 300 MG CAPSULE] 30 capsule 1    Sig: TAKE 1 CAPSULE (300 MG TOTAL) BY MOUTH AT BEDTIME. FOR SCIATIC AND NERVE PAIN     Neurology: Anticonvulsants - gabapentin Passed - 07/23/2021 11:53 AM      Passed - Valid encounter within last 12 months    Recent Outpatient Visits          1 month ago Lumbar herniated disc   [facility] Malva Limes, MD   2 months ago Dysfunction of left eustachian tube   [facility] Malva Limes, MD   9 months ago Dupuytren's contracture of left hand   [facility] Malva Limes, MD   1 year ago Anxiety   [facility] Malva Limes, MD   2 years ago Low back pain potentially associated with radiculopathy   [facility] Malva Limes, MD      Future Appointments            In 3 days Fisher, Demetrios Isaacs, MD [facility], [facility]

## 2021-07-26 ENCOUNTER — Ambulatory Visit: Payer: Self-pay | Admitting: *Deleted

## 2021-07-26 ENCOUNTER — Encounter: Payer: Self-pay | Admitting: *Deleted

## 2021-07-26 ENCOUNTER — Ambulatory Visit: Payer: Medicare Other | Admitting: Family Medicine

## 2021-07-26 DIAGNOSIS — F410 Panic disorder [episodic paroxysmal anxiety] without agoraphobia: Secondary | ICD-10-CM

## 2021-07-26 DIAGNOSIS — G8929 Other chronic pain: Secondary | ICD-10-CM

## 2021-07-26 DIAGNOSIS — F32A Depression, unspecified: Secondary | ICD-10-CM

## 2021-07-26 DIAGNOSIS — F419 Anxiety disorder, unspecified: Secondary | ICD-10-CM
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
## 2021-07-26 NOTE — Patient Instructions (Signed)
Visit Information  Thank you for taking time to visit with me today. Please don't hesitate to contact me if I can be of assistance to you before our next scheduled telephone appointment.  Following are the goals we discussed today:  - begin personal counseling - talk about feelings with a friend, family or spiritual advisor - practice positive thinking and self-talk  Our next appointment is by telephone on 08/03/20 at 11:30am  Please call the care guide team at [phone] if you need to cancel or reschedule your appointment.   If you are experiencing a Mental Health or [place] or need someone to talk to, please call the Suicide and Crisis Lifeline: 988   The patient verbalized understanding of instructions, educational materials, and care plan provided today and declined offer to receive copy of patient instructions, educational materials, and care plan.   Telephone follow up appointment with care management team member scheduled for: 08/03/21  Elliot Gurney, [place] Worker  [facility] Care Management [phone]

## 2021-07-26 NOTE — Progress Notes (Deleted)
Established patient visit   Patient: Dylan [name]   DOB: [birth year]   66 y.o. Male  MRN: [id number] Visit Date: 07/26/2021  Today's healthcare provider: Lelon Huh, MD   No chief complaint on file.  Subjective    HPI  Follow up for chronic pain:  The patient was last seen for this on 06/16/2021.  Changes made at last visit include adding gabapentin (NEURONTIN) 300 MG capsule; Take 1 capsule (300 mg total) by mouth at bedtime for sciatic and nerve pain. He was taking up to 800mg  ibuprofen and having more trouble with upset stomach. Was prescribed meloxicam in the past which he stopped due to stomach upset will try celecoxib (CELEBREX) 100 MG capsule; Take 1 capsule (100 mg total) by mouth 2 (two) times daily for back pain    He reports {excellent/good/fair/poor:19665} compliance with treatment. He feels that condition is {improved/worse/unchanged:3041574}. He {is/is not:21021397} having side effects. ***  -----------------------------------------------------------------------------------------   Medications: Outpatient Medications Prior to Visit  Medication Sig   HYDROcodone-acetaminophen (NORCO) 10-325 MG tablet Take 1-2 tablets by mouth every 6 (six) hours as needed for severe pain.   amLODipine (NORVASC) 5 MG tablet TAKE 1 TABLET(5 MG) BY MOUTH EVERY EVENING   aspirin 81 MG tablet Take 81 mg by mouth daily. Reported on 01/01/2016   celecoxib (CELEBREX) 100 MG capsule Take 1 capsule (100 mg total) by mouth 2 (two) times daily. For back pain   cyclobenzaprine (FLEXERIL) 10 MG tablet TAKE 1 TABLET BY MOUTH 3 TIMES DAILY AS NEEDED FOR MUSCLE SPASM(S)   diclofenac (VOLTAREN) 50 MG EC tablet Take 1 tablet (50 mg total) by mouth 2 (two) times daily. Take with food   fluticasone (FLONASE) 50 MCG/ACT nasal spray Place 2 sprays into both nostrils daily.   gabapentin (NEURONTIN) 300 MG capsule TAKE 1 CAPSULE (300 MG TOTAL) BY MOUTH AT BEDTIME. FOR SCIATIC AND NERVE PAIN   lidocaine  (LIDODERM) 5 % Place 1 patch onto the skin every 12 (twelve) hours. Remove & Discard patch within 12 hours or as directed by MD   LORazepam (ATIVAN) 1 MG tablet TAKE 1 TABLET (1 MG TOTAL) BY MOUTH EVERY 6 (SIX) HOURS AS NEEDED. FOR ANXIETY   Melatonin 3 MG CAPS Take 3 mg by mouth as needed.    Niacin (VITAMIN B-3 PO) Take 1 tablet by mouth daily.   omeprazole (PRILOSEC) 40 MG capsule TAKE 1 CAPSULE(40 MG) BY MOUTH DAILY   OVER THE COUNTER MEDICATION CEREBRA 2 TABLETS DAILY   rosuvastatin (CRESTOR) 5 MG tablet TAKE 1 TABLET (5 MG TOTAL) BY MOUTH DAILY.   tadalafil (CIALIS) 20 MG tablet TAKE ONE-HALF TO ONE TABLET BY MOUTH EVERY OTHER DAY AS NEEDED FOR ERECTILE DYSFUNCTION   TESTOSTERONE NA Take 3 tablets by mouth daily. Nugenix   venlafaxine XR (EFFEXOR-XR) 75 MG 24 hr capsule Take 3 capsules (225 mg total) by mouth every morning.   No facility-administered medications prior to visit.    Review of Systems  {Labs   Heme   Chem   Endocrine   Serology   Results Review (optional):23779}   Objective    There were no vitals taken for this visit. {Show previous vital signs (optional):23777}  Physical Exam  ***  No results found for any visits on 07/26/21.  Assessment & Plan     ***  No follow-ups on file.      {provider attestation***:1}   Lelon Huh, MD  [facility] [phone] (phone) [phone] (fax)  [facility]  °

## 2021-07-26 NOTE — Progress Notes (Signed)
This encounter was created in error - please disregard.

## 2021-07-27 ENCOUNTER — Telehealth: Payer: Medicare Other | Admitting: Family Medicine

## 2021-07-27 MED ORDER — AMLODIPINE BESYLATE 5 MG PO TABS: 5.0000 mg | ORAL_TABLET | Freq: Every day | ORAL | 0 refills | Status: DC

## 2021-08-03 ENCOUNTER — Telehealth: Payer: Self-pay | Admitting: *Deleted

## 2021-08-03 ENCOUNTER — Telehealth: Payer: Medicare Other | Admitting: *Deleted

## 2021-08-03 NOTE — Telephone Encounter (Signed)
°  Care Management   Follow Up Note   08/03/2021 Name: Dylan [name] MRN: [id number] DOB: September 02, 1955   Referred by: Malva Limes, MD Reason for referral : No chief complaint on file.   An unsuccessful telephone outreach was attempted today. The patient was referred to the case management team for assistance with care management and care coordination.   Follow Up Plan: Telephone follow up appointment with care management team member scheduled for: 08/05/21    Verna Czech, LCSW Clinical Social Worker  [facility] Care Management [phone]

## 2021-08-05 ENCOUNTER — Ambulatory Visit: Payer: Medicare Other | Admitting: *Deleted

## 2021-08-05 DIAGNOSIS — F32A Depression, unspecified: Secondary | ICD-10-CM

## 2021-08-05 DIAGNOSIS — F410 Panic disorder [episodic paroxysmal anxiety] without agoraphobia: Secondary | ICD-10-CM

## 2021-08-05 DIAGNOSIS — F419 Anxiety disorder, unspecified: Secondary | ICD-10-CM

## 2021-08-05 NOTE — Telephone Encounter (Signed)
Pt called saying he was sorry if he was rude or short when talking with you today.  He said he ws having a bad day.  He just wanted to let you know.  He said he realized you were just trying to help him.
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
## 2021-08-06 NOTE — Patient Instructions (Signed)
Visit Information  Thank you for taking time to visit with me today. Please don't hesitate to contact me if I can be of assistance to you before our next scheduled telephone appointment.  Following are the goals we discussed today:  - begin personal counseling - talk about feelings with a friend, family or spiritual advisor - practice positive thinking and self-talk  Our next appointment is in-person at @PCP @'s office on 08/09/21 at 10am  Please call the care guide team at [phone] if you need to cancel or reschedule your appointment.   If you are experiencing a Mental Health or Behavioral Health Crisis or need someone to talk to, please call the Suicide and Crisis Lifeline: 988   Patient verbalizes understanding of instructions and care plan provided today and agrees to view in MyChart. Active MyChart status confirmed with patient.    Telephone follow up appointment with care management team member scheduled for: 08/09/21   08/11/21, LCSW Clinical Social Worker  [facility] Care Management [phone]

## 2021-08-08 ENCOUNTER — Other Ambulatory Visit: Payer: Self-pay | Admitting: Family Medicine

## 2021-08-08 DIAGNOSIS — G8929 Other chronic pain: Secondary | ICD-10-CM

## 2021-08-08 DIAGNOSIS — M5126 Other intervertebral disc displacement, lumbar region: Secondary | ICD-10-CM

## 2021-08-08 NOTE — Telephone Encounter (Signed)
Requested Prescriptions  Pending Prescriptions Disp Refills   celecoxib (CELEBREX) 100 MG capsule [Pharmacy Med Name: CELECOXIB 100 MG CAPSULE] 60 capsule 0    Sig: TAKE 1 CAPSULE (100 MG TOTAL) BY MOUTH 2 (TWO) TIMES DAILY. FOR BACK PAIN     Analgesics:  COX2 Inhibitors Passed - 08/08/2021 12:43 AM      Passed - HGB in normal range and within 360 days    Hemoglobin  Date Value Ref Range Status  05/31/2021 15.5 13.0 - 17.0 g/dL Final  47/03/6282 66.2 13.0 - 17.7 g/dL Final         Passed - Cr in normal range and within 360 days    Creat  Date Value Ref Range Status  04/25/2017 0.87 0.70 - 1.25 mg/dL Final    Comment:    For patients >3 years of age, the reference limit for Creatinine is approximately 13% higher for people identified as African-American. .    Creatinine, Ser  Date Value Ref Range Status  05/31/2021 0.69 0.61 - 1.24 mg/dL Final         Passed - Patient is not pregnant      Passed - Valid encounter within last 12 months    Recent Outpatient Visits          1 month ago Lumbar herniated disc   [facility] Malva Limes, MD   2 months ago Dysfunction of left eustachian tube   [facility] Malva Limes, MD   9 months ago Dupuytren's contracture of left hand   [facility] Malva Limes, MD   1 year ago Anxiety   [facility] Malva Limes, MD   2 years ago Low back pain potentially associated with radiculopathy   [facility] Malva Limes, MD      Future Appointments            Tomorrow Sherrie Mustache Demetrios Isaacs, MD [facility]

## 2021-08-09 ENCOUNTER — Ambulatory Visit: Payer: Self-pay | Admitting: *Deleted

## 2021-08-09 ENCOUNTER — Ambulatory Visit (INDEPENDENT_AMBULATORY_CARE_PROVIDER_SITE_OTHER): Payer: Medicare Other | Admitting: Family Medicine

## 2021-08-09 ENCOUNTER — Encounter: Payer: Self-pay | Admitting: Family Medicine

## 2021-08-09 ENCOUNTER — Other Ambulatory Visit: Payer: Self-pay

## 2021-08-09 VITALS — BP 152/92 | HR 106 | Temp 98.3°F | Resp 18 | Wt 185.0 lb

## 2021-08-09 DIAGNOSIS — F419 Anxiety disorder, unspecified: Secondary | ICD-10-CM

## 2021-08-09 DIAGNOSIS — M5416 Radiculopathy, lumbar region: Secondary | ICD-10-CM | POA: Diagnosis not present

## 2021-08-09 DIAGNOSIS — I10 Essential (primary) hypertension: Secondary | ICD-10-CM | POA: Diagnosis not present

## 2021-08-09 DIAGNOSIS — M5126 Other intervertebral disc displacement, lumbar region: Secondary | ICD-10-CM | POA: Diagnosis not present

## 2021-08-09 DIAGNOSIS — M545 Low back pain, unspecified: Secondary | ICD-10-CM | POA: Diagnosis not present

## 2021-08-09 DIAGNOSIS — F1190 Opioid use, unspecified, uncomplicated: Secondary | ICD-10-CM

## 2021-08-09 DIAGNOSIS — F410 Panic disorder [episodic paroxysmal anxiety] without agoraphobia: Secondary | ICD-10-CM

## 2021-08-09 MED ORDER — HYDROCODONE-ACETAMINOPHEN 10-325 MG PO TABS: 1.0000 | ORAL_TABLET | Freq: Four times a day (QID) | ORAL | 0 refills | Status: DC | PRN

## 2021-08-09 MED ORDER — AMLODIPINE BESYLATE 5 MG PO TABS: 10.0000 mg | ORAL_TABLET | Freq: Every day | ORAL | Status: DC
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
## 2021-08-09 NOTE — Progress Notes (Signed)
Established patient visit   Patient: Dylan [name]   DOB: [birth year]   66 y.o. Male  MRN: [id number] Visit Date: 08/09/2021  Today's healthcare provider: [name], MD   Chief Complaint  Patient presents with   Back Pain   Hypertension   Anxiety   Subjective    Back Pain This is a chronic problem. The problem has been gradually worsening since onset. The pain is present in the lumbar spine. Pertinent negatives include no abdominal pain, chest pain or fever. Treatments tried: Hydrocodone, Gabapentin and Celebrex. The treatment provided mild relief.   Patient was last seen for this problems 06/16/2021. Changes made during that visit includes adding gabapentin 300mg  at bedtime for sciatic nerve pain. Patient reports good compliance with treatment, but states his pain is unchanged.  Hypertension, follow-up  BP Readings from Last 3 Encounters:  07/06/21 (!) 155/101  06/16/21 (!) 130/91  05/31/21 (!) 181/96   Wt Readings from Last 3 Encounters:  07/06/21 183 lb (83 kg)  06/16/21 178 lb (80.7 kg)  06/09/21 180 lb (81.6 kg)     He was last seen for hypertension more than 1 year ago.     Management since that visit includes continue same treatment.  He reports good compliance with treatment. He is not having side effects.  He is following a Regular diet. He is exercising. He does not smoke.  Use of agents associated with hypertension: NSAIDS.   Outside blood pressures are not checked. Symptoms: No chest pain No chest pressure  No palpitations No syncope  No dyspnea No orthopnea  No paroxysmal nocturnal dyspnea No lower extremity edema   Pertinent labs: Lab Results  Component Value Date   CHOL 188 02/01/2021   HDL 53 02/01/2021   LDLCALC 100 (H) 02/01/2021   TRIG 204 (H) 02/01/2021   CHOLHDL 3.5 02/01/2021   Lab Results  Component Value Date   NA 138 05/31/2021   K 3.5 05/31/2021   CREATININE 0.69 05/31/2021   GFRNONAA >60 05/31/2021   GLUCOSE 107 (H)  05/31/2021   TSH 3.340 11/03/2020     The 10-year ASCVD risk score (Arnett DK, et al., 2019) is: 18.8%   ---------------------------------------------------------------------------------------------------  Anxiety, Follow-up    He reports good compliance with treatment. He reports good tolerance of treatment. He is not having side effects.   He feels his anxiety is moderate and Unchanged since last visit.  Symptoms: No chest pain No difficulty concentrating  No dizziness No fatigue  No feelings of losing control No insomnia  No irritable No palpitations  No panic attacks No racing thoughts  No shortness of breath No sweating  No tremors/shakes    GAD-7 Results No flowsheet data found.  PHQ-9 Scores PHQ9 SCORE ONLY 06/24/2021 06/14/2021 05/17/2021  PHQ-9 Total Score 6 5 24     ---------------------------------------------------------------------------------------------------    Medications: Outpatient Medications Prior to Visit  Medication Sig   amLODipine (NORVASC) 5 MG tablet Take 1 tablet (5 mg total) by mouth daily.   aspirin 81 MG tablet Take 81 mg by mouth daily. Reported on 01/01/2016   celecoxib (CELEBREX) 100 MG capsule TAKE 1 CAPSULE (100 MG TOTAL) BY MOUTH 2 (TWO) TIMES DAILY. FOR BACK PAIN   cyclobenzaprine (FLEXERIL) 10 MG tablet TAKE 1 TABLET BY MOUTH 3 TIMES DAILY AS NEEDED FOR MUSCLE SPASM(S)   diclofenac (VOLTAREN) 50 MG EC tablet Take 1 tablet (50 mg total) by mouth 2 (two) times daily. Take with food   fluticasone (FLONASE)  50 MCG/ACT nasal spray Place 2 sprays into both nostrils daily.   gabapentin (NEURONTIN) 300 MG capsule TAKE 1 CAPSULE (300 MG TOTAL) BY MOUTH AT BEDTIME. FOR SCIATIC AND NERVE PAIN   HYDROcodone-acetaminophen (NORCO) 10-325 MG tablet Take 1-2 tablets by mouth every 6 (six) hours as needed for severe pain.   lidocaine (LIDODERM) 5 % Place 1 patch onto the skin every 12 (twelve) hours. Remove & Discard patch within 12 hours or as  directed by MD   LORazepam (ATIVAN) 1 MG tablet TAKE 1 TABLET (1 MG TOTAL) BY MOUTH EVERY 6 (SIX) HOURS AS NEEDED. FOR ANXIETY   Melatonin 3 MG CAPS Take 3 mg by mouth as needed.    Niacin (VITAMIN B-3 PO) Take 1 tablet by mouth daily.   omeprazole (PRILOSEC) 40 MG capsule TAKE 1 CAPSULE(40 MG) BY MOUTH DAILY   OVER THE COUNTER MEDICATION CEREBRA 2 TABLETS DAILY   rosuvastatin (CRESTOR) 5 MG tablet TAKE 1 TABLET (5 MG TOTAL) BY MOUTH DAILY.   tadalafil (CIALIS) 20 MG tablet TAKE ONE-HALF TO ONE TABLET BY MOUTH EVERY OTHER DAY AS NEEDED FOR ERECTILE DYSFUNCTION   TESTOSTERONE NA Take 3 tablets by mouth daily. Nugenix   venlafaxine XR (EFFEXOR-XR) 75 MG 24 hr capsule Take 3 capsules (225 mg total) by mouth every morning.   No facility-administered medications prior to visit.    Review of Systems  Constitutional:  Negative for appetite change, chills and fever.  Respiratory:  Negative for chest tightness, shortness of breath and wheezing.   Cardiovascular:  Negative for chest pain and palpitations.  Gastrointestinal:  Negative for abdominal pain, nausea and vomiting.  Musculoskeletal:  Positive for back pain.  Psychiatric/Behavioral:  The patient is nervous/anxious.       Objective    BP (!) 152/92 (BP Location: Right Arm, Patient Position: Sitting, Cuff Size: Large)    Pulse (!) 106    Temp 98.3 F (36.8 C) (Oral)    Resp 18    Wt 185 lb (83.9 kg)    SpO2 100% Comment: room air   BMI 25.80 kg/m  {Show previous vital signs (optional):23777}  Physical Exam    General: Appearance:     Well developed, well nourished male in no acute distress  Eyes:    PERRL, conjunctiva/corneas clear, EOM's intact       Lungs:     Clear to auscultation bilaterally, respirations unlabored  Heart:    Tachycardic. Normal rhythm. No murmurs, rubs, or gallops.    MS:   All extremities are intact.    Neurologic:   Awake, alert, oriented x 3. No apparent focal neurological defect.          Assessment &  Plan     1. Essential (primary) hypertension Uncontrolled. Is tolerating amlodipine well. Will increase from amLODipine (NORVASC) 5 MG tablet; to 2 tablets (10 mg total) by mouth daily.  Consider adding valsartan if not better at follow up. He has CPE scheduled in march and should have enough 5mg  tablets to take 2 a day until thin.   2. Lumbar herniated disc He is considering surgery for multilevel bulging lumbar disks on most recent MRI.   refill HYDROcodone-acetaminophen (NORCO) 10-325 MG tablet; Take 1-2 tablets by mouth every 6 (six) hours as needed for severe pain.  Dispense: 168 tablet; Refill: 0  3. Chronic, continuous use of opioids  - Pain Mgt Scrn (14 Drugs), Ur       The entirety of the information documented in the  History of Present Illness, Review of Systems and Physical Exam were personally obtained by me. Portions of this information were initially documented by the CMA and reviewed by me for thoroughness and accuracy.     Lelon Huh, MD  [facility] [phone] (phone) [phone] (fax)  [place]

## 2021-08-09 NOTE — Patient Instructions (Signed)
Visit Information  Thank you for taking time to visit with me today. Please don't hesitate to contact me if I can be of assistance to you before our next scheduled telephone appointment.  Following are the goals we discussed today:   - begin personal counseling - talk about feelings with a friend, family or spiritual advisor - practice positive thinking and self-talk -follow up with food banks in your local area if needed-list mailed to your home    Our next appointment is by telephone on 08/24/21 at 2:30pm  Please call the care guide team at [phone] if you need to cancel or reschedule your appointment.   If you are experiencing a Mental Health or Behavioral Health Crisis or need someone to talk to, please call the Suicide and Crisis Lifeline: 988   Patient verbalizes understanding of instructions and care plan provided today and agrees to view in MyChart. Active MyChart status confirmed with patient.    Telephone follow up appointment with care management team member scheduled for: 08/24/21   Verna Czech, LCSW Clinical Social Worker  [facility] [phone]

## 2021-08-10 LAB — PAIN MGT SCRN (14 DRUGS), UR
Amphetamine Scrn, Ur: NEGATIVE ng/mL
BARBITURATE SCREEN URINE: NEGATIVE ng/mL
BENZODIAZEPINE SCREEN, URINE: NEGATIVE ng/mL
Buprenorphine, Urine: NEGATIVE ng/mL
CANNABINOIDS UR QL SCN: POSITIVE ng/mL — AB
Cocaine (Metab) Scrn, Ur: NEGATIVE ng/mL
Creatinine(Crt), U: 115.1 mg/dL (ref 20.0–300.0)
Fentanyl, Urine: NEGATIVE pg/mL
Meperidine Screen, Urine: NEGATIVE ng/mL
Methadone Screen, Urine: NEGATIVE ng/mL
OXYCODONE+OXYMORPHONE UR QL SCN: NEGATIVE ng/mL
Opiate Scrn, Ur: POSITIVE ng/mL — AB
Ph of Urine: 5.5 (ref 4.5–8.9)
Phencyclidine Qn, Ur: NEGATIVE ng/mL
Propoxyphene Scrn, Ur: NEGATIVE ng/mL
Tramadol Screen, Urine: NEGATIVE ng/mL

## 2021-08-17 DIAGNOSIS — F32A Depression, unspecified: Secondary | ICD-10-CM

## 2021-08-24 ENCOUNTER — Ambulatory Visit: Payer: Medicare Other | Admitting: *Deleted

## 2021-08-24 DIAGNOSIS — I10 Essential (primary) hypertension: Secondary | ICD-10-CM

## 2021-08-24 DIAGNOSIS — F419 Anxiety disorder, unspecified: Secondary | ICD-10-CM

## 2021-08-24 DIAGNOSIS — F410 Panic disorder [episodic paroxysmal anxiety] without agoraphobia: Secondary | ICD-10-CM

## 2021-08-24 DIAGNOSIS — M5126 Other intervertebral disc displacement, lumbar region: Secondary | ICD-10-CM
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
## 2021-08-24 NOTE — Patient Instructions (Signed)
Visit Information  Thank you for taking time to visit with me today. Please don't hesitate to contact me if I can be of assistance to you before our next scheduled telephone appointment.  Following are the goals we discussed today:   begin personal counseling - talk about feelings with a friend, family or spiritual advisor - practice positive thinking and self-talk -follow up with food banks in your local area if needed -continued use of Food Banks when needed .   If you are experiencing a Mental Health or [place] or need someone to talk to, please call the Suicide and Crisis Lifeline: 988   Patient verbalizes understanding of instructions and care plan provided today and agrees to view in [place]. Active MyChart status confirmed with patient.    No further follow up required: patient to contact this Education officer, museum with any additional mental health needs   Elliot Gurney, [place] Worker  [facility] Care Management [phone]

## 2021-08-30 ENCOUNTER — Other Ambulatory Visit: Payer: Self-pay | Admitting: Family Medicine

## 2021-08-30 DIAGNOSIS — M5126 Other intervertebral disc displacement, lumbar region: Secondary | ICD-10-CM

## 2021-08-31 ENCOUNTER — Telehealth: Payer: Self-pay

## 2021-08-31 NOTE — Telephone Encounter (Signed)
Copied from CRM [id number]. Topic: General - Other >> Aug 31, 2021  3:33 PM Gaetana Michaelis A wrote: Reason for CRM: The patient would like to speak with R. Chambers or a member of staff when possible about the refill of their HYDROcodone-acetaminophen (NORCO) 10-325 MG tablet   Please contact further when available

## 2021-09-01 MED ORDER — HYDROCODONE-ACETAMINOPHEN 10-325 MG PO TABS: 1.0000 | ORAL_TABLET | Freq: Four times a day (QID) | ORAL | 0 refills | Status: DC | PRN

## 2021-09-01 NOTE — Telephone Encounter (Signed)
I returned patient's call. He didn't have anything that he wanted to discuss at this time.

## 2021-09-02 NOTE — Progress Notes (Signed)
I,Roshena L Chambers,acting as a scribe for Lelon Huh, MD.,have documented all relevant documentation on the behalf of Lelon Huh, MD,as directed by  Lelon Huh, MD while in the presence of Lelon Huh, MD.   Complete physical exam   Patient: Dylan [name]   DOB: April 19, 1956   66 y.o. Male  MRN: [id number] Visit Date: 09/03/2021  Today's healthcare provider: Lelon Huh, MD   Chief Complaint  Patient presents with   Annual Exam   Hypertension   Subjective    Dylan [name] is a 66 y.o. male who presents today for a complete physical exam.  He reports consuming a general diet. Home exercise routine includes leg lifts. He generally feels fairly well. He reports sleeping poorly. He does not have additional problems to discuss today.   Had AWV with [facility] on 06/14/2021.   HPI  Hypertension, follow-up  BP Readings from Last 3 Encounters:  09/03/21 (!) 165/102  08/09/21 (!) 152/92  07/06/21 (!) 155/101   Wt Readings from Last 3 Encounters:  09/03/21 186 lb (84.4 kg)  08/09/21 185 lb (83.9 kg)  07/06/21 183 lb (83 kg)     He was last seen for hypertension on 08/09/2021.   BP at that visit was 152/92. Management since that visit includes increasing from amLODipine (NORVASC) 5 MG tablet; to 2 tablets (10 mg total) by mouth daily.  He reports good compliance with treatment. He is not having side effects.  He is following a Regular diet. He is exercising. He does not smoke.  Use of agents associated with hypertension: NSAIDS.   Outside blood pressures are averaging 187/70. Symptoms: No chest pain No chest pressure  No palpitations No syncope  No dyspnea No orthopnea  No paroxysmal nocturnal dyspnea No lower extremity edema   Pertinent labs: Lab Results  Component Value Date   CHOL 188 02/01/2021   HDL 53 02/01/2021   LDLCALC 100 (H) 02/01/2021   TRIG 204 (H) 02/01/2021   CHOLHDL 3.5 02/01/2021   Lab Results  Component Value Date   NA 138 05/31/2021   K  3.5 05/31/2021   CREATININE 0.69 05/31/2021   GFRNONAA >60 05/31/2021   GLUCOSE 107 (H) 05/31/2021   TSH 3.340 11/03/2020     The 10-year ASCVD risk score (Arnett DK, et al., 2019) is: 20.8%   ---------------------------------------------------------------------------------------------------   Past Medical History:  Diagnosis Date   History of chicken pox    History of measles as a child    History of mumps as a child    Hyperlipidemia    Hypertension    Seizures ([place])    blacked out in shower when coming off benzo - 'withdrawals'   Shortness of breath dyspnea    with exertion ? d/t stress & anxiety   Past Surgical History:  Procedure Laterality Date   COLONOSCOPY WITH PROPOFOL N/A 07/05/2017   Procedure: COLONOSCOPY WITH PROPOFOL;  Surgeon: Jonathon Bellows, MD;  Location: [facility] ENDOSCOPY;  Service: Gastroenterology;  Laterality: N/A;   CT SCAN     JOINT REPLACEMENT     KNEE SURGERY Right 1993   LUMBAR [place] SURGERY  03/18/2015   R L4-5 micro discectomy, Dr. Collene Gobble LAMINECTOMY/DECOMPRESSION MICRODISCECTOMY Right 03/18/2015   Procedure: Right Lumbar Four-Five Microdiscectomy;  Surgeon: Newman Pies, MD;  Location: [facility];  Service: Neurosurgery;  Laterality: Right;  Right L45 microdiskectomy   LUNG SURGERY Left 2009   Lung Collapse: left chest tube   ROTATOR CUFF REPAIR  2004  had rotator cuff injury and surgery was performed   TONSILLECTOMY     TONSILLECTOMY AND ADENOIDECTOMY  1960   Social History   Socioeconomic History   Marital status: Divorced    Spouse name: Not on file   Number of children: 3   Years of education: Not on file   Highest education level: High school graduate  Occupational History   Occupation: disability  Tobacco Use   Smoking status: Never   Smokeless tobacco: Never  Vaping Use   Vaping Use: Never used  Substance and Sexual Activity   Alcohol use: Yes    Comment: 1 beer on holidays   Drug use: No   Sexual activity: Yes   Other Topics Concern   Not on file  Social History Narrative   ** Merged History Encounter **       Social Determinants of Health   Financial Resource Strain: Low Risk    Difficulty of Paying Living Expenses: Not very hard  Food Insecurity: Food Insecurity Present   Worried About Running Out of Food in the Last Year: Sometimes true   Ran Out of Food in the Last Year: Sometimes true  Transportation Needs: No Transportation Needs   Lack of Transportation (Medical): No   Lack of Transportation (Non-Medical): No  Physical Activity: Insufficiently Active   Days of Exercise per Week: 2 days   Minutes of Exercise per Session: 20 min  Stress: Stress Concern Present   Feeling of Stress : To some extent  Social Connections: Socially Isolated   Frequency of Communication with Friends and Family: More than three times a week   Frequency of Social Gatherings with Friends and Family: Twice a week   Attends Religious Services: Never   Marine scientist or Organizations: No   Attends Music therapist: Never   Marital Status: Divorced  Human resources officer Violence: Not At Risk   Fear of Current or Ex-Partner: No   Emotionally Abused: No   Physically Abused: No   Sexually Abused: No   Family Status  Relation Name Status   Mother  Deceased at age 9       Cause of Death: CVA with Alzheimers disease   Father  Deceased at age 102   Sister  12   Brother  Alive   Sister  Deceased at age 79       Cause of death: Congestive Heart Failure   Family History  Problem Relation Age of Onset   Alzheimer's disease Mother    Stroke Mother    Heart disease Father    Congestive Heart Failure Brother    CAD Brother    Congestive Heart Failure Sister    Allergies  Allergen Reactions   Belsomra [Suvorexant] Other (See Comments)    Hallucinations   Morphine And Related Hives and Nausea And Vomiting    Dry heaves and hives   Duloxetine Other (See Comments)    Nervous    Meloxicam Other (See Comments)    Stomach cramps   Other Other (See Comments)    CBD oil - felt anxious    Patient Care Team: Birdie Sons, MD as PCP - General (Family Medicine) Newman Pies, MD as Consulting Physician (Neurosurgery) Anza, Logan M, LCSW as Social Worker   Medications: Outpatient Medications Prior to Visit  Medication Sig   amLODipine (NORVASC) 5 MG tablet Take 2 tablets (10 mg total) by mouth daily.   aspirin 81 MG tablet Take 81 mg by mouth  daily. Reported on 01/01/2016   celecoxib (CELEBREX) 100 MG capsule TAKE 1 CAPSULE (100 MG TOTAL) BY MOUTH 2 (TWO) TIMES DAILY. FOR BACK PAIN   cyclobenzaprine (FLEXERIL) 10 MG tablet TAKE 1 TABLET BY MOUTH 3 TIMES DAILY AS NEEDED FOR MUSCLE SPASM(S)   diclofenac (VOLTAREN) 50 MG EC tablet Take 1 tablet (50 mg total) by mouth 2 (two) times daily. Take with food   fluticasone (FLONASE) 50 MCG/ACT nasal spray Place 2 sprays into both nostrils daily.   gabapentin (NEURONTIN) 300 MG capsule TAKE 1 CAPSULE (300 MG TOTAL) BY MOUTH AT BEDTIME. FOR SCIATIC AND NERVE PAIN   HYDROcodone-acetaminophen (NORCO) 10-325 MG tablet Take 1-2 tablets by mouth every 6 (six) hours as needed for severe pain.   lidocaine (LIDODERM) 5 % Place 1 patch onto the skin every 12 (twelve) hours. Remove & Discard patch within 12 hours or as directed by MD   LORazepam (ATIVAN) 1 MG tablet TAKE 1 TABLET (1 MG TOTAL) BY MOUTH EVERY 6 (SIX) HOURS AS NEEDED. FOR ANXIETY   Melatonin 3 MG CAPS Take 3 mg by mouth as needed.    Niacin (VITAMIN B-3 PO) Take 1 tablet by mouth daily.   omeprazole (PRILOSEC) 40 MG capsule TAKE 1 CAPSULE(40 MG) BY MOUTH DAILY   OVER THE COUNTER MEDICATION CEREBRA 2 TABLETS DAILY   rosuvastatin (CRESTOR) 5 MG tablet TAKE 1 TABLET (5 MG TOTAL) BY MOUTH DAILY.   tadalafil (CIALIS) 20 MG tablet TAKE ONE-HALF TO ONE TABLET BY MOUTH EVERY OTHER DAY AS NEEDED FOR ERECTILE DYSFUNCTION   TESTOSTERONE NA Take 3 tablets by mouth daily. Nugenix    venlafaxine XR (EFFEXOR-XR) 75 MG 24 hr capsule Take 3 capsules (225 mg total) by mouth every morning.   No facility-administered medications prior to visit.    Review of Systems  Constitutional:  Positive for appetite change. Negative for chills, fatigue and fever.  HENT:  Positive for dental problem, ear pain, hearing loss and tinnitus. Negative for congestion, nosebleeds and trouble swallowing.   Eyes:  Positive for visual disturbance. Negative for pain.  Respiratory:  Negative for cough, chest tightness and shortness of breath.   Cardiovascular:  Negative for chest pain, palpitations and leg swelling.  Gastrointestinal:  Positive for constipation. Negative for abdominal pain, blood in stool, diarrhea, nausea and vomiting.  Endocrine: Negative for polydipsia, polyphagia and polyuria.  Genitourinary:  Negative for dysuria and flank pain.  Musculoskeletal:  Positive for arthralgias and back pain. Negative for joint swelling, myalgias and neck stiffness.  Skin:  Negative for color change, rash and wound.  Neurological:  Positive for headaches. Negative for dizziness, tremors, seizures, speech difficulty, weakness and light-headedness.  Hematological:  Bruises/bleeds easily.  Psychiatric/Behavioral:  Negative for behavioral problems, confusion, decreased concentration, dysphoric mood and sleep disturbance. The patient is not nervous/anxious.   All other systems reviewed and are negative.    Objective    BP (!) 165/102 (BP Location: Right Arm, Patient Position: Sitting, Cuff Size: Normal)    Pulse (!) 124    Temp 99.1 F (37.3 C) (Oral)    Resp 16    Ht 5\' 10"  (1.778 m)    Wt 186 lb (84.4 kg)    SpO2 100% Comment: room air   BMI 26.69 kg/m    Physical Exam   General Appearance:     Well developed, well nourished male. Alert, cooperative, in no acute distress, appears stated age  Head:    Normocephalic, without obvious abnormality, atraumatic  Eyes:  PERRL, conjunctiva/corneas clear,  EOM's intact, fundi    benign, both eyes       Ears:    Normal TM's and external ear canals, both ears  Neck:   Supple, symmetrical, trachea midline, no adenopathy;       thyroid:  No enlargement/tenderness/nodules; no carotid   bruit or JVD  Back:     Symmetric, no curvature, ROM normal, no CVA tenderness  Lungs:     Clear to auscultation bilaterally, respirations unlabored  Chest wall:    No tenderness or deformity  Heart:    Tachycardic. Normal rhythm. II/VI systolic murmur, no rubs, or gallops.  S1 and S2 normal  Abdomen:     Soft, non-tender, bowel sounds active all four quadrants,    no masses, no organomegaly  Genitalia:    deferred  Rectal:    deferred  Extremities:   All extremities are intact. No cyanosis or edema  Pulses:   2+ and symmetric all extremities  Skin:   Skin color, texture, turgor normal, no rashes or lesions  Lymph nodes:   Cervical, supraclavicular, and axillary nodes normal  Neurologic:   CNII-XII intact. Normal strength, sensation and reflexes      throughout     Last depression screening scores PHQ 2/9 Scores 09/03/2021 06/24/2021 06/14/2021  PHQ - 2 Score 3 2 2   PHQ- 9 Score 13 6 5    Last fall risk screening Fall Risk  09/03/2021  Falls in the past year? 1  Comment -  Number falls in past yr: 1  Injury with Fall? 1  Risk for fall due to : History of fall(s)  Risk for fall due to: Comment -  Follow up Falls prevention discussed   Last Audit-C alcohol use screening Alcohol Use Disorder Test (AUDIT) 09/03/2021  1. How often do you have a drink containing alcohol? 0  2. How many drinks containing alcohol do you have on a typical day when you are drinking? 0  3. How often do you have six or more drinks on one occasion? 0  AUDIT-C Score 0  Alcohol Brief Interventions/Follow-up -   A score of 3 or more in women, and 4 or more in men indicates increased risk for alcohol abuse, EXCEPT if all of the points are from question 1   No results found for any  visits on 09/03/21.  Assessment & Plan    Routine Health Maintenance and Physical Exam  Exercise Activities and Dietary recommendations  Goals      DIET - EAT MORE FRUITS AND VEGETABLES     Eat healthier     Manage My Emotions     Timeframe:  Long-Range Goal Priority:  High Start Date:   06/24/21                          Expected End Date:    12/23/21                   Follow Up Date 08/09/21   - begin personal counseling - talk about feelings with a friend, family or spiritual advisor - practice positive thinking and self-talk -follow up with food banks in your local area if needed -continued use of Food Banks when needed   Why is this important?   When you are stressed, down or upset, your body reacts too.  For example, your blood pressure may get higher; you may have a headache or stomachache.  When your  emotions get the best of you, your body's ability to fight off cold and flu gets weak.  These steps will help you manage your emotions.     Notes:      Prevent falls     Recommend to remove any items from the home that may cause slips or trips.            Immunization History  Administered Date(s) Administered   Fluad Quad(high Dose 65+) 05/17/2021   Influenza Split 04/07/2008   Influenza,inj,Quad PF,6+ Mos 04/05/2013, 04/15/2014, 04/01/2015, 04/20/2016, 04/25/2017, 03/23/2018, 03/13/2019   Moderna Sars-Covid-2 Vaccination 11/03/2020   Tdap 09/12/2008   Zoster, Live 05/06/2016    Health Maintenance  Topic Date Due   Zoster Vaccines- Shingrix (1 of 2) Never done   TETANUS/TDAP  09/12/2018   Pneumonia Vaccine 45+ Years old (1 - PCV) Never done   COVID-19 Vaccine (2 - Moderna series) 12/01/2020   COLONOSCOPY (Pts 45-74yrs Insurance coverage will need to be confirmed)  07/06/2027   INFLUENZA VACCINE  Completed   Hepatitis C Screening  Completed   HIV Screening  Completed   HPV VACCINES  Aged Out    Discussed health benefits of physical activity, and  encouraged him to engage in regular exercise appropriate for his age and condition.  1. Essential (primary) hypertension Uncontrolled. Likely exacerbated by anxiety. - EKG 12-Lead - amLODipine (NORVASC) 10 MG tablet; Take 1 tablet (10 mg total) by mouth daily.  Dispense: 90 tablet; Refill: 1  add metoprolol succinate (TOPROL-XL) 25 MG 24 hr tablet; Take 1 tablet (25 mg total) by mouth daily.  Dispense: 90 tablet; Refill: 1  2. Hyperlipidemia, mixed refill- rosuvastatin (CRESTOR) 5 MG tablet; Take 1 tablet (5 mg total) by mouth daily.  Dispense: 90 tablet; Refill: 2  3. Closed fracture of bones of trunk   4. Chronic back pain, unspecified back location, unspecified back pain laterality Has been stable on current pain medications for the last few years, UTD on urine drug screens.   5. Anxiety Tachycardic today with otherwise normal EKG. He is taking his anxiolytics consistently and tolerating well    Future Appointments  Date Time Provider [place]  10/08/2021 10:40 AM Birdie Sons, MD [facility]  06/14/2022  1:40 PM BFP-NURSE HEALTH ADVISOR [facility]  06/15/2022  1:00 PM BFP-NURSE HEALTH ADVISOR [facility]        The entirety of the information documented in the History of Present Illness, Review of Systems and Physical Exam were personally obtained by me. Portions of this information were initially documented by the CMA and reviewed by me for thoroughness and accuracy.     Lelon Huh, MD  [facility] [phone] (phone) [phone] (fax)  [place]

## 2021-09-03 ENCOUNTER — Ambulatory Visit (INDEPENDENT_AMBULATORY_CARE_PROVIDER_SITE_OTHER): Payer: Medicare Other | Admitting: Family Medicine

## 2021-09-03 ENCOUNTER — Encounter: Payer: Self-pay | Admitting: Family Medicine

## 2021-09-03 ENCOUNTER — Other Ambulatory Visit: Payer: Self-pay

## 2021-09-03 VITALS — BP 165/102 | HR 124 | Temp 99.1°F | Resp 16 | Ht 70.0 in | Wt 186.0 lb

## 2021-09-03 DIAGNOSIS — I10 Essential (primary) hypertension: Secondary | ICD-10-CM | POA: Diagnosis not present

## 2021-09-03 DIAGNOSIS — E782 Mixed hyperlipidemia: Secondary | ICD-10-CM | POA: Diagnosis not present

## 2021-09-03 DIAGNOSIS — G8929 Other chronic pain: Secondary | ICD-10-CM

## 2021-09-03 DIAGNOSIS — S229XXA Fracture of bony thorax, part unspecified, initial encounter for closed fracture: Secondary | ICD-10-CM

## 2021-09-03 DIAGNOSIS — M549 Dorsalgia, unspecified: Secondary | ICD-10-CM

## 2021-09-03 DIAGNOSIS — F419 Anxiety disorder, unspecified: Secondary | ICD-10-CM

## 2021-09-03 DIAGNOSIS — Z0000 Encounter for general adult medical examination without abnormal findings: Secondary | ICD-10-CM | POA: Diagnosis not present

## 2021-09-03 MED ORDER — AMLODIPINE BESYLATE 10 MG PO TABS: 10.0000 mg | ORAL_TABLET | Freq: Every day | ORAL | 1 refills | Status: DC

## 2021-09-03 MED ORDER — METOPROLOL SUCCINATE ER 25 MG PO TB24: 25.0000 mg | ORAL_TABLET | Freq: Every day | ORAL | 1 refills | Status: DC

## 2021-09-03 MED ORDER — ROSUVASTATIN CALCIUM 5 MG PO TABS: 5.0000 mg | ORAL_TABLET | Freq: Every day | ORAL | 2 refills | Status: DC

## 2021-09-07 ENCOUNTER — Other Ambulatory Visit: Payer: Self-pay | Admitting: Family Medicine

## 2021-09-07 DIAGNOSIS — M5126 Other intervertebral disc displacement, lumbar region: Secondary | ICD-10-CM

## 2021-09-07 DIAGNOSIS — G8929 Other chronic pain: Secondary | ICD-10-CM

## 2021-09-19 ENCOUNTER — Other Ambulatory Visit: Payer: Self-pay | Admitting: Family Medicine

## 2021-09-19 DIAGNOSIS — M5126 Other intervertebral disc displacement, lumbar region: Secondary | ICD-10-CM

## 2021-09-20 MED ORDER — HYDROCODONE-ACETAMINOPHEN 10-325 MG PO TABS: 1.0000 | ORAL_TABLET | Freq: Four times a day (QID) | ORAL | 0 refills | Status: DC | PRN

## 2021-09-27 ENCOUNTER — Other Ambulatory Visit: Payer: Self-pay | Admitting: Family Medicine

## 2021-09-27 DIAGNOSIS — F410 Panic disorder [episodic paroxysmal anxiety] without agoraphobia: Secondary | ICD-10-CM

## 2021-09-27 DIAGNOSIS — F430 Acute stress reaction: Secondary | ICD-10-CM

## 2021-10-07 ENCOUNTER — Other Ambulatory Visit: Payer: Self-pay | Admitting: Family Medicine

## 2021-10-07 DIAGNOSIS — M5126 Other intervertebral disc displacement, lumbar region: Secondary | ICD-10-CM

## 2021-10-07 DIAGNOSIS — G8929 Other chronic pain: Secondary | ICD-10-CM

## 2021-10-07 NOTE — Progress Notes (Signed)
I,Roshena L Chambers,acting as a scribe for [name], MD.,have documented all relevant documentation on the behalf of [name], MD,as directed by  [name], MD while in the presence of [name], MD.   Established patient visit   Patient: Dylan [name]   DOB: 04/11/1956   66 y.o. Male  MRN: [id number] Visit Date: 10/08/2021  Today's healthcare provider: [name], MD   Chief Complaint  Patient presents with   Hypertension   Subjective    HPI  Hypertension, follow-up  BP Readings from Last 3 Encounters:  10/08/21 138/87  09/03/21 (!) 165/102  08/09/21 (!) 152/92   Wt Readings from Last 3 Encounters:  10/08/21 183 lb (83 kg)  09/03/21 186 lb (84.4 kg)  08/09/21 185 lb (83.9 kg)     He was last seen for hypertension 1 months ago.  BP at that visit was 165/102. Management since that visit includes add metoprolol succinate.  He reports good compliance with treatment. He is having side effects. Had dizziness and hypersomnia initially when starting metoprolol. He is following a Regular diet. He is exercising. He does not smoke.   Outside blood pressures are averaging 147/74. Symptoms: No chest pain No chest pressure  No palpitations No syncope  No dyspnea No orthopnea  No paroxysmal nocturnal dyspnea No lower extremity edema   Pertinent labs: Lab Results  Component Value Date   CHOL 188 02/01/2021   HDL 53 02/01/2021   LDLCALC 100 (H) 02/01/2021   TRIG 204 (H) 02/01/2021   CHOLHDL 3.5 02/01/2021   Lab Results  Component Value Date   NA 138 05/31/2021   K 3.5 05/31/2021   CREATININE 0.69 05/31/2021   GFRNONAA >60 05/31/2021   GLUCOSE 107 (H) 05/31/2021   TSH 3.340 11/03/2020     The 10-year ASCVD risk score (Arnett DK, et al., 2019) is: 16.7%   ---------------------------------------------------------------------------------------------------   Medications: Outpatient Medications Prior to Visit  Medication Sig   amLODipine  (NORVASC) 10 MG tablet Take 1 tablet (10 mg total) by mouth daily.   aspirin 81 MG tablet Take 81 mg by mouth daily. Reported on 01/01/2016   celecoxib (CELEBREX) 100 MG capsule TAKE 1 CAPSULE (100 MG TOTAL) BY MOUTH 2 (TWO) TIMES DAILY. FOR BACK PAIN   cyclobenzaprine (FLEXERIL) 10 MG tablet TAKE 1 TABLET BY MOUTH 3 TIMES DAILY AS NEEDED FOR MUSCLE SPASM(S)   diclofenac (VOLTAREN) 50 MG EC tablet Take 1 tablet (50 mg total) by mouth 2 (two) times daily. Take with food   fluticasone (FLONASE) 50 MCG/ACT nasal spray Place 2 sprays into both nostrils daily.   gabapentin (NEURONTIN) 300 MG capsule TAKE 1 CAPSULE (300 MG TOTAL) BY MOUTH AT BEDTIME. FOR SCIATIC AND NERVE PAIN   HYDROcodone-acetaminophen (NORCO) 10-325 MG tablet Take 1-2 tablets by mouth every 6 (six) hours as needed for severe pain.   lidocaine (LIDODERM) 5 % Place 1 patch onto the skin every 12 (twelve) hours. Remove & Discard patch within 12 hours or as directed by MD   LORazepam (ATIVAN) 1 MG tablet TAKE 1 TABLET (1 MG TOTAL) BY MOUTH EVERY 6 (SIX) HOURS AS NEEDED. FOR ANXIETY   Melatonin 3 MG CAPS Take 3 mg by mouth as needed.    metoprolol succinate (TOPROL-XL) 25 MG 24 hr tablet Take 1 tablet (25 mg total) by mouth daily.   Niacin (VITAMIN B-3 PO) Take 1 tablet by mouth daily.   omeprazole (PRILOSEC) 40 MG capsule TAKE 1 CAPSULE(40 MG) BY  MOUTH DAILY   OVER THE COUNTER MEDICATION CEREBRA 2 TABLETS DAILY   rosuvastatin (CRESTOR) 5 MG tablet Take 1 tablet (5 mg total) by mouth daily.   tadalafil (CIALIS) 20 MG tablet TAKE ONE-HALF TO ONE TABLET BY MOUTH EVERY OTHER DAY AS NEEDED FOR ERECTILE DYSFUNCTION   TESTOSTERONE NA Take 3 tablets by mouth daily. Nugenix   No facility-administered medications prior to visit.    Review of Systems  Constitutional:  Positive for chills and diaphoresis. Negative for appetite change and fever.  Respiratory:  Negative for chest tightness, shortness of breath and wheezing.   Cardiovascular:   Negative for chest pain and palpitations.  Gastrointestinal:  Negative for abdominal pain, nausea and vomiting.      Objective    BP 138/87 (BP Location: Left Arm, Patient Position: Sitting, Cuff Size: Large)   Pulse (!) 116   Temp 98.5 F (36.9 C) (Oral)   Resp 16   Wt 183 lb (83 kg)   SpO2 100% Comment: room air  BMI 26.26 kg/m    Physical Exam   General: Appearance:     Well developed, well nourished male in no acute distress  Eyes:    PERRL, conjunctiva/corneas clear, EOM's intact       Lungs:     Clear to auscultation bilaterally, respirations unlabored  Heart:    Tachycardic. Normal rhythm. No murmurs, rubs, or gallops.    MS:   All extremities are intact.    Neurologic:   Awake, alert, oriented x 3. No apparent focal neurological defect.         Assessment & Plan     1. Primary hypertension Much better with addition of metoprolol. Continue current medications.    2. Episode of recurrent major depressive disorder, unspecified depression episode severity (HCC) He stopped venlafaxine over a year ago, which was prescribed for anxiety. He is interested in trying Prozac. He was prescribed 20mg  daily and is to follow up in 6 weeks.   3. Lumbar herniated disc Continue current pain medications. He felt like gabapentin was helping and helped him rest, but has run out of medication. Refill sent to his pharmacy.       The entirety of the information documented in the History of Present Illness, Review of Systems and Physical Exam were personally obtained by me. Portions of this information were initially documented by the CMA and reviewed by me for thoroughness and accuracy.     , MD  [facility] [phone] (phone) [phone] (fax)  [facility]

## 2021-10-08 ENCOUNTER — Other Ambulatory Visit: Payer: Self-pay

## 2021-10-08 ENCOUNTER — Ambulatory Visit (INDEPENDENT_AMBULATORY_CARE_PROVIDER_SITE_OTHER): Payer: Medicare Other | Admitting: Family Medicine

## 2021-10-08 ENCOUNTER — Encounter: Payer: Self-pay | Admitting: Family Medicine

## 2021-10-08 VITALS — BP 138/87 | HR 116 | Temp 98.5°F | Resp 16 | Wt 183.0 lb

## 2021-10-08 DIAGNOSIS — I10 Essential (primary) hypertension: Secondary | ICD-10-CM

## 2021-10-08 DIAGNOSIS — M5126 Other intervertebral disc displacement, lumbar region: Secondary | ICD-10-CM

## 2021-10-08 DIAGNOSIS — F339 Major depressive disorder, recurrent, unspecified: Secondary | ICD-10-CM | POA: Diagnosis not present

## 2021-10-08 MED ORDER — HYDROCODONE-ACETAMINOPHEN 10-325 MG PO TABS: 1.0000 | ORAL_TABLET | Freq: Four times a day (QID) | ORAL | 0 refills | Status: DC | PRN

## 2021-10-08 MED ORDER — FLUOXETINE HCL 20 MG PO CAPS: 20.0000 mg | ORAL_CAPSULE | Freq: Every day | ORAL | 0 refills | Status: DC

## 2021-10-08 MED ORDER — GABAPENTIN 300 MG PO CAPS: 300.0000 mg | ORAL_CAPSULE | Freq: Every day | ORAL | 2 refills | Status: DC

## 2021-10-08 NOTE — Patient Instructions (Signed)
.   Please review the attached list of medications and notify my office if there are any errors.   . Please bring all of your medications to every appointment so we can make sure that our medication list is the same as yours.   

## 2021-10-16 ENCOUNTER — Other Ambulatory Visit: Payer: Self-pay | Admitting: Family Medicine

## 2021-10-16 DIAGNOSIS — N529 Male erectile dysfunction, unspecified: Secondary | ICD-10-CM

## 2021-10-17 ENCOUNTER — Other Ambulatory Visit: Payer: Self-pay | Admitting: Family Medicine

## 2021-10-17 DIAGNOSIS — F339 Major depressive disorder, recurrent, unspecified: Secondary | ICD-10-CM

## 2021-10-25 ENCOUNTER — Other Ambulatory Visit: Payer: Self-pay | Admitting: Family Medicine

## 2021-10-25 DIAGNOSIS — M5126 Other intervertebral disc displacement, lumbar region: Secondary | ICD-10-CM

## 2021-10-26 NOTE — Telephone Encounter (Signed)
LOV 10-08-21 Fisher  ?NOV 11-19-21 ?LR 10-08-21 #168/0 refills  ?

## 2021-10-27 ENCOUNTER — Other Ambulatory Visit: Payer: Self-pay | Admitting: Family Medicine

## 2021-10-27 DIAGNOSIS — M5126 Other intervertebral disc displacement, lumbar region: Secondary | ICD-10-CM

## 2021-10-27 MED ORDER — HYDROCODONE-ACETAMINOPHEN 10-325 MG PO TABS: 1.0000 | ORAL_TABLET | Freq: Four times a day (QID) | ORAL | 0 refills | Status: DC | PRN

## 2021-10-27 NOTE — Telephone Encounter (Signed)
Requested medication (s) are due for refill today: Due 10/29/21 ? ?Requested medication (s) are on the active medication list: yes   ? ?Last refill: 10/08/21 #168   0  refills ? ?Future visit scheduled Yes     11/19/21                               ? ?Notes to clinic: Not delegated ? ?Requested Prescriptions  ?Pending Prescriptions Disp Refills  ? HYDROcodone-acetaminophen (NORCO) 10-325 MG tablet 168 tablet 0  ?  Sig: Take 1-2 tablets by mouth every 6 (six) hours as needed for severe pain.  ?  ? Not Delegated - Analgesics:  Opioid Agonist Combinations Failed - 10/27/2021 11:02 AM  ?  ?  Failed - This refill cannot be delegated  ?  ?  Failed - Urine Drug Screen completed in last 360 days  ?  ?  Passed - Valid encounter within last 3 months  ?  Recent Outpatient Visits   ? ?      ? 2 weeks ago Primary hypertension  ? [facility] Sherrie Mustache, Demetrios Isaacs, MD  ? 1 month ago Annual physical exam  ? [facility] Malva Limes, MD  ? 2 months ago Essential (primary) hypertension  ? [facility] Malva Limes, MD  ? 4 months ago Lumbar herniated disc  ? [facility] Malva Limes, MD  ? 5 months ago Dysfunction of left eustachian tube  ? [facility] Sherrie Mustache, Demetrios Isaacs, MD  ? ?  ?  ?Future Appointments   ? ?        ? In 3 weeks Fisher, Demetrios Isaacs, MD [facility]  ? ?  ? ?  ?  ?  ? ? ? ? ?

## 2021-10-27 NOTE — Telephone Encounter (Signed)
Copied from CRM [id number]. Topic: Quick Communication - Rx Refill/Question ?>> Oct 27, 2021 10:18 AM Marylen Ponto wrote: ?Medication: HYDROcodone-acetaminophen (NORCO) 10-325 MG tablet ? ?Has the patient contacted their pharmacy? Yes.  Pt told to contact provider ?(Agent: If no, request that the patient contact the pharmacy for the refill. If patient does not wish to contact the pharmacy document the reason why and proceed with request.) ?(Agent: If yes, when and what did the pharmacy advise?) ? ?Preferred Pharmacy (with phone number or street name): [id number] - [place], [place] - [address]  ?Phone: [phone] ?Fax: [phone] ? ?Has the patient been seen for an appointment in the last year OR does the patient have an upcoming appointment? Yes.   ? ?Agent: Please be advised that RX refills may take up to 3 business days. We ask that you follow-up with your pharmacy. ?

## 2021-11-01 ENCOUNTER — Other Ambulatory Visit: Payer: Self-pay | Admitting: Family Medicine

## 2021-11-01 DIAGNOSIS — I10 Essential (primary) hypertension: Secondary | ICD-10-CM

## 2021-11-14 ENCOUNTER — Other Ambulatory Visit: Payer: Self-pay | Admitting: Family Medicine

## 2021-11-14 DIAGNOSIS — M5126 Other intervertebral disc displacement, lumbar region: Secondary | ICD-10-CM

## 2021-11-15 ENCOUNTER — Other Ambulatory Visit: Payer: Self-pay | Admitting: Family Medicine

## 2021-11-15 DIAGNOSIS — M5126 Other intervertebral disc displacement, lumbar region: Secondary | ICD-10-CM

## 2021-11-15 DIAGNOSIS — K219 Gastro-esophageal reflux disease without esophagitis: Secondary | ICD-10-CM

## 2021-11-15 NOTE — Telephone Encounter (Signed)
Pt called back in to add this medication as well.  ? ?omeprazole (PRILOSEC) 40 MG capsule  ?

## 2021-11-15 NOTE — Telephone Encounter (Signed)
Medication Refill - Medication: HYDROcodone-acetaminophen (NORCO) 10-325 MG tablet ? ?Has the patient contacted their pharmacy? No. Pt previously told to contact provider ? ?Preferred Pharmacy (with phone number or street name):  ?[id number] [place] [place] - [address] Phone:  [phone]  ?Fax:  [phone]  ?  ? ?Has the patient been seen for an appointment in the last year OR does the patient have an upcoming appointment? Yes.   ? ?Agent: Please be advised that RX refills may take up to 3 business days. We ask that you follow-up with your pharmacy.  ?

## 2021-11-16 ENCOUNTER — Encounter: Payer: Self-pay | Admitting: Family Medicine

## 2021-11-16 MED ORDER — HYDROCODONE-ACETAMINOPHEN 10-325 MG PO TABS: 1.0000 | ORAL_TABLET | Freq: Four times a day (QID) | ORAL | 0 refills | Status: DC | PRN

## 2021-11-16 MED ORDER — OMEPRAZOLE 40 MG PO CPDR: DELAYED_RELEASE_CAPSULE | ORAL | 3 refills | Status: AC

## 2021-11-16 NOTE — Telephone Encounter (Signed)
Requested medications are due for refill today.  Unsure ? ?Requested medications are on the active medications list.  yes ? ?Last refill. 10/27/2021 #168 0 refills ? ?Future visit scheduled.   yes ? ?Notes to clinic.  Medication refill is not delegated. ? ? ? ?Requested Prescriptions  ?Pending Prescriptions Disp Refills  ? HYDROcodone-acetaminophen (NORCO) 10-325 MG tablet 168 tablet 0  ?  Sig: Take 1-2 tablets by mouth every 6 (six) hours as needed for severe pain.  ?  ? Not Delegated - Analgesics:  Opioid Agonist Combinations Failed - 11/15/2021  3:16 PM  ?  ?  Failed - This refill cannot be delegated  ?  ?  Failed - Urine Drug Screen completed in last 360 days  ?  ?  Passed - Valid encounter within last 3 months  ?  Recent Outpatient Visits   ? ?      ? 1 month ago Primary hypertension  ? [facility] Sherrie Mustache, Demetrios Isaacs, MD  ? 2 months ago Annual physical exam  ? [facility] Malva Limes, MD  ? 3 months ago Essential (primary) hypertension  ? [facility] Malva Limes, MD  ? 5 months ago Lumbar herniated disc  ? [facility] Malva Limes, MD  ? 6 months ago Dysfunction of left eustachian tube  ? [facility] Sherrie Mustache, Demetrios Isaacs, MD  ? ?  ?  ?Future Appointments   ? ?        ? In 3 days Fisher, Demetrios Isaacs, MD [facility]  ? ?  ? ? ?  ?  ?  ?Signed Prescriptions Disp Refills  ? omeprazole (PRILOSEC) 40 MG capsule 90 capsule 3  ?  Sig: TAKE 1 CAPSULE(40 MG) BY MOUTH DAILY  ?  ? Gastroenterology: Proton Pump Inhibitors Passed - 11/15/2021  3:16 PM  ?  ?  Passed - Valid encounter within last 12 months  ?  Recent Outpatient Visits   ? ?      ? 1 month ago Primary hypertension  ? [facility] Sherrie Mustache, Demetrios Isaacs, MD  ? 2 months ago Annual physical exam  ? [facility] Malva Limes, MD  ? 3 months ago Essential (primary) hypertension  ? [facility] Malva Limes, MD  ? 5 months ago  Lumbar herniated disc  ? [facility] Malva Limes, MD  ? 6 months ago Dysfunction of left eustachian tube  ? [facility] Sherrie Mustache, Demetrios Isaacs, MD  ? ?  ?  ?Future Appointments   ? ?        ? In 3 days Fisher, Demetrios Isaacs, MD [facility]  ? ?  ? ? ?  ?  ?  ?  ?

## 2021-11-16 NOTE — Telephone Encounter (Signed)
Requested Prescriptions  ?Pending Prescriptions Disp Refills  ?? HYDROcodone-acetaminophen (NORCO) 10-325 MG tablet 168 tablet 0  ?  Sig: Take 1-2 tablets by mouth every 6 (six) hours as needed for severe pain.  ?  ? Not Delegated - Analgesics:  Opioid Agonist Combinations Failed - 11/15/2021  3:16 PM  ?  ?  Failed - This refill cannot be delegated  ?  ?  Failed - Urine Drug Screen completed in last 360 days  ?  ?  Passed - Valid encounter within last 3 months  ?  Recent Outpatient Visits   ?      ? 1 month ago Primary hypertension  ? [facility] Sherrie Mustache, Demetrios Isaacs, MD  ? 2 months ago Annual physical exam  ? [facility] Malva Limes, MD  ? 3 months ago Essential (primary) hypertension  ? [facility] Malva Limes, MD  ? 5 months ago Lumbar herniated disc  ? [facility] Malva Limes, MD  ? 6 months ago Dysfunction of left eustachian tube  ? [facility] Sherrie Mustache, Demetrios Isaacs, MD  ?  ?  ?Future Appointments   ?        ? In 3 days Fisher, Demetrios Isaacs, MD [facility]  ?  ? ?  ?  ?  ?? omeprazole (PRILOSEC) 40 MG capsule 90 capsule 3  ?  Sig: TAKE 1 CAPSULE(40 MG) BY MOUTH DAILY  ?  ? Gastroenterology: Proton Pump Inhibitors Passed - 11/15/2021  3:16 PM  ?  ?  Passed - Valid encounter within last 12 months  ?  Recent Outpatient Visits   ?      ? 1 month ago Primary hypertension  ? [facility] Sherrie Mustache, Demetrios Isaacs, MD  ? 2 months ago Annual physical exam  ? [facility] Malva Limes, MD  ? 3 months ago Essential (primary) hypertension  ? [facility] Malva Limes, MD  ? 5 months ago Lumbar herniated disc  ? [facility] Malva Limes, MD  ? 6 months ago Dysfunction of left eustachian tube  ? [facility] Sherrie Mustache, Demetrios Isaacs, MD  ?  ?  ?Future Appointments   ?        ? In 3 days Fisher, Demetrios Isaacs, MD [facility]  ?  ? ?  ?  ?  ? ?

## 2021-11-18 NOTE — Progress Notes (Deleted)
Established patient visit   Patient: Dylan [name]   DOB: Mar 29, 1956   66 y.o. Male  MRN: [id number] Visit Date: 11/19/2021  Today's healthcare provider: [name], MD   No chief complaint on file.  Subjective    HPI  Depression, Follow-up  He  was last seen for this on 10/08/2021.   Changes made at last visit include starting Prozac 20mg  daily and was advised to follow up in 6 weeks.   He reports {excellent/good/fair/poor:19665} compliance with treatment. He {is/is not:21021397} having side effects. ***  He reports {DESC; GOOD/FAIR/POOR:18685} tolerance of treatment. Current symptoms include: {Symptoms; depression:1002} He feels he is {improved/worse/unchanged:3041574} since last visit.     09/03/2021    9:55 AM 06/24/2021    3:17 PM 06/14/2021    3:12 PM  Depression screen PHQ 2/9  Decreased Interest 0 1 1  Down, Depressed, Hopeless 3 1 1   PHQ - 2 Score 3 2 2   Altered sleeping 3 2 1   Tired, decreased energy 1 1 1   Change in appetite 2 1 1   Feeling bad or failure about yourself  1 0 0  Trouble concentrating 2 0 0  Moving slowly or fidgety/restless 1 0 0  Suicidal thoughts 0 0 0  PHQ-9 Score 13 6 5   Difficult doing work/chores Not difficult at all  Not difficult at all    -----------------------------------------------------------------------------------------   Medications: Outpatient Medications Prior to Visit  Medication Sig   amLODipine (NORVASC) 10 MG tablet Take 1 tablet (10 mg total) by mouth daily.   aspirin 81 MG tablet Take 81 mg by mouth daily. Reported on 01/01/2016   celecoxib (CELEBREX) 100 MG capsule TAKE 1 CAPSULE (100 MG TOTAL) BY MOUTH 2 (TWO) TIMES DAILY. FOR BACK PAIN   cyclobenzaprine (FLEXERIL) 10 MG tablet TAKE 1 TABLET BY MOUTH 3 TIMES DAILY AS NEEDED FOR MUSCLE SPASM(S)   diclofenac (VOLTAREN) 50 MG EC tablet Take 1 tablet (50 mg total) by mouth 2 (two) times daily. Take with food   FLUoxetine (PROZAC) 20 MG capsule TAKE 1 CAPSULE BY  MOUTH EVERY DAY   fluticasone (FLONASE) 50 MCG/ACT nasal spray Place 2 sprays into both nostrils daily.   gabapentin (NEURONTIN) 300 MG capsule Take 1 capsule (300 mg total) by mouth at bedtime. For sciatic and nerve pain   HYDROcodone-acetaminophen (NORCO) 10-325 MG tablet Take 1-2 tablets by mouth every 6 (six) hours as needed for severe pain.   lidocaine (LIDODERM) 5 % Place 1 patch onto the skin every 12 (twelve) hours. Remove & Discard patch within 12 hours or as directed by MD   LORazepam (ATIVAN) 1 MG tablet TAKE 1 TABLET (1 MG TOTAL) BY MOUTH EVERY 6 (SIX) HOURS AS NEEDED. FOR ANXIETY   Melatonin 3 MG CAPS Take 3 mg by mouth as needed.    metoprolol succinate (TOPROL-XL) 25 MG 24 hr tablet Take 1 tablet (25 mg total) by mouth daily.   Niacin (VITAMIN B-3 PO) Take 1 tablet by mouth daily.   omeprazole (PRILOSEC) 40 MG capsule TAKE 1 CAPSULE(40 MG) BY MOUTH DAILY   OVER THE COUNTER MEDICATION CEREBRA 2 TABLETS DAILY   rosuvastatin (CRESTOR) 5 MG tablet Take 1 tablet (5 mg total) by mouth daily.   tadalafil (CIALIS) 20 MG tablet TAKE 1/2-1 TABLET BY MOUTH EVERY OTHER DAY AS NEEDED FOR ERECTILE DYSFUNCTION   TESTOSTERONE NA Take 3 tablets by mouth daily. Nugenix   No facility-administered medications prior to visit.    Review of Systems  {  Labs  Heme  Chem  Endocrine  Serology  Results Review (optional):23779}   Objective    There were no vitals taken for this visit. {Show previous vital signs (optional):23777}  Physical Exam  ***  No results found for any visits on 11/19/21.  Assessment & Plan     ***  No follow-ups on file.      {provider attestation***:1}   [name], MD  [facility] [phone] (phone) [phone] (fax)  [facility]

## 2021-11-19 ENCOUNTER — Ambulatory Visit: Payer: Medicare Other | Admitting: Family Medicine

## 2021-12-02 ENCOUNTER — Other Ambulatory Visit: Payer: Self-pay | Admitting: Family Medicine

## 2021-12-02 DIAGNOSIS — M5126 Other intervertebral disc displacement, lumbar region: Secondary | ICD-10-CM

## 2021-12-02 NOTE — Progress Notes (Signed)
I,Jana Robinson,acting as a scribe for [name], MD.,have documented all relevant documentation on the behalf of [name], MD,as directed by  [name], MD while in the presence of [name], MD.  Established patient visit   Patient: Dylan [name]   DOB: 09/06/1955   66 y.o. Male  MRN: [id number] Visit Date: 12/06/2021  Today's healthcare provider: [name], MD   Chief Complaint  Patient presents with   Depression   Subjective    HPI  Depression, Follow-up  He  was last seen for this 6 weeks ago. Changes made at last visit include prescribed Prozac 20mg  daily.   He reports excellent compliance with treatment. He is not having side effects.  He reports good tolerance of treatment. Current symptoms include: depressed mood, difficulty concentrating, and fatigue He feels he is Improved since last visit. Improved but in a lot of pain with back and sciatica.    12/06/2021    1:06 PM  GAD 7 : Generalized Anxiety Score  Nervous, Anxious, on Edge 3  Control/stop worrying 3  Worry too much - different things 3  Trouble relaxing 1  Restless 1  Easily annoyed or irritable 1  Afraid - awful might happen 3  Total GAD 7 Score 15  Anxiety Difficulty Not difficult at all         12/06/2021    1:08 PM 09/03/2021    9:55 AM 06/24/2021    3:17 PM  Depression screen PHQ 2/9  Decreased Interest 3 0 1  Down, Depressed, Hopeless 3 3 1   PHQ - 2 Score 6 3 2   Altered sleeping 3 3 2   Tired, decreased energy 2 1 1   Change in appetite 0 2 1  Feeling bad or failure about yourself  0 1 0  Trouble concentrating 3 2 0  Moving slowly or fidgety/restless 0 1 0  Suicidal thoughts 0 0 0  PHQ-9 Score 14 13 6   Difficult doing work/chores Somewhat difficult Not difficult at all     -----------------------------------------------------------------------------------------   Medications: Outpatient Medications Prior to Visit  Medication Sig   amLODipine (NORVASC) 10 MG  tablet Take 1 tablet (10 mg total) by mouth daily.   aspirin 81 MG tablet Take 81 mg by mouth daily. Reported on 01/01/2016   celecoxib (CELEBREX) 100 MG capsule TAKE 1 CAPSULE (100 MG TOTAL) BY MOUTH 2 (TWO) TIMES DAILY. FOR BACK PAIN   cyclobenzaprine (FLEXERIL) 10 MG tablet TAKE 1 TABLET BY MOUTH 3 TIMES DAILY AS NEEDED FOR MUSCLE SPASM(S)   diclofenac (VOLTAREN) 50 MG EC tablet Take 1 tablet (50 mg total) by mouth 2 (two) times daily. Take with food   FLUoxetine (PROZAC) 20 MG capsule TAKE 1 CAPSULE BY MOUTH EVERY DAY   fluticasone (FLONASE) 50 MCG/ACT nasal spray Place 2 sprays into both nostrils daily.   gabapentin (NEURONTIN) 300 MG capsule Take 1 capsule (300 mg total) by mouth at bedtime. For sciatic and nerve pain   HYDROcodone-acetaminophen (NORCO) 10-325 MG tablet Take 1-2 tablets by mouth every 6 (six) hours as needed for severe pain.   lidocaine (LIDODERM) 5 % Place 1 patch onto the skin every 12 (twelve) hours. Remove & Discard patch within 12 hours or as directed by MD   LORazepam (ATIVAN) 1 MG tablet TAKE 1 TABLET (1 MG TOTAL) BY MOUTH EVERY 6 (SIX) HOURS AS NEEDED. FOR ANXIETY   Melatonin 3 MG CAPS Take 3 mg by mouth as needed.    metoprolol succinate (  TOPROL-XL) 25 MG 24 hr tablet Take 1 tablet (25 mg total) by mouth daily.   Niacin (VITAMIN B-3 PO) Take 1 tablet by mouth daily.   omeprazole (PRILOSEC) 40 MG capsule TAKE 1 CAPSULE(40 MG) BY MOUTH DAILY   OVER THE COUNTER MEDICATION CEREBRA 2 TABLETS DAILY   rosuvastatin (CRESTOR) 5 MG tablet Take 1 tablet (5 mg total) by mouth daily.   tadalafil (CIALIS) 20 MG tablet TAKE 1/2-1 TABLET BY MOUTH EVERY OTHER DAY AS NEEDED FOR ERECTILE DYSFUNCTION   TESTOSTERONE NA Take 3 tablets by mouth daily. Nugenix   No facility-administered medications prior to visit.        Objective    BP 120/69 (BP Location: Right Arm, Patient Position: Sitting, Cuff Size: Normal)   Pulse 78   Temp 98 F (36.7 C) (Core)   Resp 16   Wt 199 lb  (90.3 kg)   SpO2 99%   BMI 28.55 kg/m    Physical Exam  General appearance:  Well developed, well nourished male, cooperative and in no acute distress Head: Normocephalic, without obvious abnormality, atraumatic Respiratory: Respirations even and unlabored, normal respiratory rate Extremities: All extremities are intact.  Skin: Skin color, texture, turgor normal. No rashes seen  Psych: Appropriate mood and affect. Neurologic: Mental status: Alert, oriented to person, place, and time, thought content appropriate.   No results found for any visits on 12/06/21.  Assessment & Plan     1. Lumbar herniated disc Pain is stable, adequately controlled, but he has been taking 2 gabapentin at bedtime which also helps him rest and sleep better.   refill- gabapentin (NEURONTIN) 300 MG capsule; Take 2 capsules (600 mg total) by mouth at bedtime. For sciatic and nerve pain  Dispense: 180 capsule; Refill: 3  He has been exercising more. Encouraged to continue exercise and work on weight loss.  2. Other chronic pain   3. Moderate episode of recurrent major depressive disorder (HCC) He feels this is significantly better since starting fluoxetine at last visit and wishes to continue same dose of medications.   Follow up for other chronic problems in 3 months.        The entirety of the information documented in the History of Present Illness, Review of Systems and Physical Exam were personally obtained by me. Portions of this information were initially documented by the CMA and reviewed by me for thoroughness and accuracy.     [name], MD  [facility] [phone] (phone) [phone] (fax)  [facility]

## 2021-12-03 NOTE — Telephone Encounter (Signed)
Pt called in stating the date was wrong and he needs it for Sunday 12/05/21.

## 2021-12-04 MED ORDER — HYDROCODONE-ACETAMINOPHEN 10-325 MG PO TABS: 1.0000 | ORAL_TABLET | Freq: Four times a day (QID) | ORAL | 0 refills | Status: DC | PRN

## 2021-12-06 ENCOUNTER — Encounter: Payer: Self-pay | Admitting: Family Medicine

## 2021-12-06 ENCOUNTER — Ambulatory Visit (INDEPENDENT_AMBULATORY_CARE_PROVIDER_SITE_OTHER): Payer: Medicare Other | Admitting: Family Medicine

## 2021-12-06 VITALS — BP 120/69 | HR 78 | Temp 98.0°F | Resp 16 | Wt 199.0 lb

## 2021-12-06 DIAGNOSIS — G8929 Other chronic pain: Secondary | ICD-10-CM

## 2021-12-06 DIAGNOSIS — M5126 Other intervertebral disc displacement, lumbar region: Secondary | ICD-10-CM

## 2021-12-06 DIAGNOSIS — F331 Major depressive disorder, recurrent, moderate: Secondary | ICD-10-CM

## 2021-12-06 MED ORDER — ASPIRIN 81 MG PO TBEC: 81.0000 mg | DELAYED_RELEASE_TABLET | ORAL | Status: AC

## 2021-12-06 MED ORDER — GABAPENTIN 300 MG PO CAPS: 600.0000 mg | ORAL_CAPSULE | Freq: Every day | ORAL | 3 refills | Status: DC

## 2021-12-06 NOTE — Patient Instructions (Signed)
.   Please review the attached list of medications and notify my office if there are any errors.   . Please bring all of your medications to every appointment so we can make sure that our medication list is the same as yours.   

## 2021-12-12 ENCOUNTER — Other Ambulatory Visit: Payer: Self-pay | Admitting: Family Medicine

## 2021-12-12 DIAGNOSIS — M545 Low back pain, unspecified: Secondary | ICD-10-CM

## 2021-12-19 ENCOUNTER — Other Ambulatory Visit: Payer: Self-pay | Admitting: Family Medicine

## 2021-12-19 DIAGNOSIS — M5126 Other intervertebral disc displacement, lumbar region: Secondary | ICD-10-CM

## 2021-12-19 DIAGNOSIS — G8929 Other chronic pain: Secondary | ICD-10-CM

## 2021-12-21 ENCOUNTER — Other Ambulatory Visit: Payer: Self-pay | Admitting: Family Medicine

## 2021-12-21 DIAGNOSIS — M5126 Other intervertebral disc displacement, lumbar region: Secondary | ICD-10-CM

## 2021-12-24 MED ORDER — HYDROCODONE-ACETAMINOPHEN 10-325 MG PO TABS
1.0000 | ORAL_TABLET | Freq: Four times a day (QID) | ORAL | 0 refills | Status: DC | PRN
Start: 2021-12-24 — End: 2022-01-10

## 2021-12-27 ENCOUNTER — Other Ambulatory Visit: Payer: Self-pay | Admitting: Family Medicine

## 2021-12-27 DIAGNOSIS — F339 Major depressive disorder, recurrent, unspecified: Secondary | ICD-10-CM

## 2021-12-27 MED ORDER — FLUOXETINE HCL 20 MG PO CAPS: ORAL_CAPSULE | ORAL | 4 refills | Status: DC

## 2022-01-10 ENCOUNTER — Other Ambulatory Visit: Payer: Self-pay | Admitting: Family Medicine

## 2022-01-10 DIAGNOSIS — M5126 Other intervertebral disc displacement, lumbar region: Secondary | ICD-10-CM

## 2022-01-11 MED ORDER — HYDROCODONE-ACETAMINOPHEN 10-325 MG PO TABS: 1.0000 | ORAL_TABLET | Freq: Four times a day (QID) | ORAL | 0 refills | Status: DC | PRN

## 2022-01-12 ENCOUNTER — Other Ambulatory Visit: Payer: Self-pay | Admitting: Family Medicine

## 2022-01-12 DIAGNOSIS — M5126 Other intervertebral disc displacement, lumbar region: Secondary | ICD-10-CM

## 2022-01-12 DIAGNOSIS — F410 Panic disorder [episodic paroxysmal anxiety] without agoraphobia: Secondary | ICD-10-CM

## 2022-01-12 DIAGNOSIS — F430 Acute stress reaction: Secondary | ICD-10-CM

## 2022-01-12 MED ORDER — HYDROCODONE-ACETAMINOPHEN 10-325 MG PO TABS
1.0000 | ORAL_TABLET | Freq: Four times a day (QID) | ORAL | 0 refills | Status: DC | PRN
Start: 2022-01-12 — End: 2022-01-16

## 2022-01-12 NOTE — Telephone Encounter (Signed)
Medication Refill - Medication:HYDROcodone-acetaminophen (NORCO) 10-325 MG tablet Patients states percocet won't be approved so wants Hydrochodone he says is at [facility] Has the patient contacted their pharmacy? yes (Agent: If no, request that the patient contact the pharmacy for the refill. If patient does not wish to contact the pharmacy document the reason why and proceed with request.) (Agent: If yes, when and what did the pharmacy advise?)[facility]  Preferred Pharmacy (with phone number or street name):  [address], [place], [place] [address] Phone: [phone] Has the patient been seen for an appointment in the last year OR does the patient have an upcoming appointment? yes  Agent: Please be advised that RX refills may take up to 3 business days. We ask that you follow-up with your pharmacy.

## 2022-01-12 NOTE — Addendum Note (Signed)
Addended by: Benjiman Core on: 01/12/2022 04:27 PM   Modules accepted: Orders

## 2022-01-12 NOTE — Telephone Encounter (Signed)
Dr. Sherrie Mustache is out of the office. Dr. B refilled hydrocodone prescription yesterday. Patient says his pharmacy is out of stock, and they suggested we prescribe Percocet 10mg  as a temporary solution for the time being. Please advise if you are willing to change prescription.

## 2022-01-12 NOTE — Telephone Encounter (Signed)
Pt called reporting that the pharmacy suggests that he receive Percocet 10 as a temporary solution to them being out of stock for hydrocodone. Says he needs this today, he believes withdrawals are on the verge of starting.   Also states that his Cyclobenzaprine is crumbling into powder, he says it melts instantly in his mouth.   [id number] [place] [place] - [address]  [address] [place] [address]  Phone: [phone] Fax: [phone]

## 2022-01-12 NOTE — Telephone Encounter (Signed)
Pt called back and stated [facility] has it in [place] / please resend to that pharmacy asap

## 2022-01-12 NOTE — Addendum Note (Signed)
Addended by: Debera Lat on: 01/12/2022 04:58 PM   Modules accepted: Orders

## 2022-01-12 NOTE — Telephone Encounter (Signed)
Pt was Rx medication for the next 5 day. Dr. Sherrie Mustache is out of office until Monday.

## 2022-01-12 NOTE — Telephone Encounter (Signed)
Patient advised. Patient is going to call around to see if other pharmacies have it in stock. Patient says he is going to call  back to let us know which pharmacy to send it to.

## 2022-01-16 ENCOUNTER — Other Ambulatory Visit: Payer: Self-pay | Admitting: Physician Assistant

## 2022-01-16 DIAGNOSIS — M5126 Other intervertebral disc displacement, lumbar region: Secondary | ICD-10-CM

## 2022-01-17 MED ORDER — HYDROCODONE-ACETAMINOPHEN 10-325 MG PO TABS: 1.0000 | ORAL_TABLET | Freq: Four times a day (QID) | ORAL | 0 refills | Status: DC | PRN

## 2022-02-03 ENCOUNTER — Other Ambulatory Visit: Payer: Self-pay | Admitting: Family Medicine

## 2022-02-03 DIAGNOSIS — M5126 Other intervertebral disc displacement, lumbar region: Secondary | ICD-10-CM

## 2022-02-04 MED ORDER — HYDROCODONE-ACETAMINOPHEN 10-325 MG PO TABS: 1.0000 | ORAL_TABLET | Freq: Four times a day (QID) | ORAL | 0 refills | Status: DC | PRN

## 2022-02-04 NOTE — Addendum Note (Signed)
Addended by: Nena Polio on: 02/04/2022 11:11 AM   Modules accepted: Orders

## 2022-02-04 NOTE — Telephone Encounter (Signed)
See previous note

## 2022-02-04 NOTE — Telephone Encounter (Signed)
Pt is calling to request [facility] [place].

## 2022-02-04 NOTE — Telephone Encounter (Signed)
Medication: HYDROcodone-acetaminophen (NORCO) 10-325 MG tablet  Pt would like rx refill [id number] [place], [place] - [address]  Phone: [phone] Fax: [phone]  Rx was originally sent to [facility] and they are not open on Sunday when the rx is eligible for refill so [facility]. Please follow up with pt.

## 2022-02-04 NOTE — Telephone Encounter (Signed)
Requested medication (s) are due for refill today: yes  Requested medication (s) are on the active medication list: yes  Last refill:  02/04/22 #168 0 refills  Future visit scheduled: yes in 1 month  Notes to clinic:  not delegated per protocol. Requesting [facility]  closed on Sunday.      Requested Prescriptions  Pending Prescriptions Disp Refills   HYDROcodone-acetaminophen (NORCO) 10-325 MG tablet 168 tablet 0    Sig: Take 1-2 tablets by mouth every 6 (six) hours as needed for severe pain.     Not Delegated - Analgesics:  Opioid Agonist Combinations Failed - 02/04/2022 11:11 AM      Failed - This refill cannot be delegated      Failed - Urine Drug Screen completed in last 360 days      Passed - Valid encounter within last 3 months    Recent Outpatient Visits           2 months ago Lumbar herniated disc   [facility] Malva Limes, MD   3 months ago Primary hypertension   [facility] Malva Limes, MD   5 months ago Annual physical exam   [facility] Malva Limes, MD   5 months ago Essential (primary) hypertension   [facility] Malva Limes, MD   7 months ago Lumbar herniated disc   [facility] Malva Limes, MD       Future Appointments             In 1 month Fisher, Demetrios Isaacs, MD [facility]            Signed Prescriptions Disp Refills   HYDROcodone-acetaminophen (NORCO) 10-325 MG tablet 168 tablet 0    Sig: Take 1-2 tablets by mouth every 6 (six) hours as needed for severe pain.     Not Delegated - Analgesics:  Opioid Agonist Combinations Failed - 02/03/2022  1:17 PM      Failed - This refill cannot be delegated      Failed - Urine Drug Screen completed in last 360 days      Passed - Valid encounter within last 3 months    Recent Outpatient Visits           2 months ago Lumbar herniated  disc   [facility] Malva Limes, MD   3 months ago Primary hypertension   [facility] Malva Limes, MD   5 months ago Annual physical exam   [facility] Malva Limes, MD   5 months ago Essential (primary) hypertension   [facility] Malva Limes, MD   7 months ago Lumbar herniated disc   [facility] Malva Limes, MD       Future Appointments             In 1 month Fisher, Demetrios Isaacs, MD [facility]

## 2022-02-04 NOTE — Telephone Encounter (Signed)
   Notes to clinic:  This was sent to incorrect pharm, [facility] [address] [place] because [facility] are open on Sundays and [facility] not. Not delegated, please assess.      Requested Prescriptions  Pending Prescriptions Disp Refills   HYDROcodone-acetaminophen (NORCO) 10-325 MG tablet 168 tablet 0    Sig: Take 1-2 tablets by mouth every 6 (six) hours as needed for severe pain.     Not Delegated - Analgesics:  Opioid Agonist Combinations Failed - 02/04/2022  2:32 PM      Failed - This refill cannot be delegated      Failed - Urine Drug Screen completed in last 360 days      Passed - Valid encounter within last 3 months    Recent Outpatient Visits           2 months ago Lumbar herniated disc   [facility] Malva Limes, MD   3 months ago Primary hypertension   [facility] Malva Limes, MD   5 months ago Annual physical exam   [facility] Malva Limes, MD   5 months ago Essential (primary) hypertension   [facility] Malva Limes, MD   7 months ago Lumbar herniated disc   [facility] Malva Limes, MD       Future Appointments             In 1 month Fisher, Demetrios Isaacs, MD [facility]            Signed Prescriptions Disp Refills   HYDROcodone-acetaminophen (NORCO) 10-325 MG tablet 168 tablet 0    Sig: Take 1-2 tablets by mouth every 6 (six) hours as needed for severe pain.     Not Delegated - Analgesics:  Opioid Agonist Combinations Failed - 02/03/2022  1:17 PM      Failed - This refill cannot be delegated      Failed - Urine Drug Screen completed in last 360 days      Passed - Valid encounter within last 3 months    Recent Outpatient Visits           2 months ago Lumbar herniated disc   [facility] Malva Limes, MD   3 months ago Primary hypertension   [facility] Malva Limes, MD   5 months ago Annual physical exam    [facility] Malva Limes, MD   5 months ago Essential (primary) hypertension   [facility] Malva Limes, MD   7 months ago Lumbar herniated disc   [facility] Malva Limes, MD       Future Appointments             In 1 month Fisher, Demetrios Isaacs, MD [facility]

## 2022-02-06 MED ORDER — HYDROCODONE-ACETAMINOPHEN 10-325 MG PO TABS: 1.0000 | ORAL_TABLET | Freq: Four times a day (QID) | ORAL | 0 refills | Status: DC | PRN

## 2022-02-23 ENCOUNTER — Other Ambulatory Visit: Payer: Self-pay | Admitting: Family Medicine

## 2022-02-23 DIAGNOSIS — M5126 Other intervertebral disc displacement, lumbar region: Secondary | ICD-10-CM

## 2022-02-24 MED ORDER — HYDROCODONE-ACETAMINOPHEN 10-325 MG PO TABS: 1.0000 | ORAL_TABLET | Freq: Four times a day (QID) | ORAL | 0 refills | Status: DC | PRN

## 2022-03-14 ENCOUNTER — Other Ambulatory Visit: Payer: Self-pay | Admitting: Family Medicine

## 2022-03-14 DIAGNOSIS — M5126 Other intervertebral disc displacement, lumbar region: Secondary | ICD-10-CM

## 2022-03-15 ENCOUNTER — Ambulatory Visit: Payer: Self-pay | Admitting: *Deleted

## 2022-03-15 MED ORDER — HYDROCODONE-ACETAMINOPHEN 10-325 MG PO TABS: 1.0000 | ORAL_TABLET | Freq: Four times a day (QID) | ORAL | 0 refills | Status: DC | PRN

## 2022-03-15 NOTE — Telephone Encounter (Signed)
Pt called in to follow up on refill request for medication Hydrocodone. Pt says that he is in a lot of pain and would like to have Rx sent in as soon as possible.    [id number] [place], [place] - [address]   Future apt 03/25/22

## 2022-03-15 NOTE — Telephone Encounter (Signed)
Pt called back reporting that he is at the pharmacy waiting and has been for a while. Advised that requests have been submitted and that he is waiting for PCP since Rx is a controlled substance.

## 2022-03-15 NOTE — Telephone Encounter (Signed)
Answered question for agent.  Pt at pharmacy waiting for a pain medication refill.   It has not been approved by the provider yet.   It's a controlled substance so it must be approved by the provider, Dr. Sherrie Mustache.  I let agent know we are still waiting on the provider to approve the refill for the controlled substance.

## 2022-03-15 NOTE — Telephone Encounter (Signed)
Requested medication (s) are due for refill today:   Provider to review  Requested medication (s) are on the active medication list:   Yes  Future visit scheduled:   Yes 03/25/2022 with Dr. Sherrie Mustache   Last ordered: 02/24/2022 #168, 0 refills  Returned because it's a non delegated refill.   Pt put a note that he is in a lot of pain and requesting a refill ASAP.  This is the 2nd request.   Requested Prescriptions  Pending Prescriptions Disp Refills   HYDROcodone-acetaminophen (NORCO) 10-325 MG tablet 168 tablet 0    Sig: Take 1-2 tablets by mouth every 6 (six) hours as needed for severe pain.     Not Delegated - Analgesics:  Opioid Agonist Combinations Failed - 03/15/2022  9:28 AM      Failed - This refill cannot be delegated      Failed - Urine Drug Screen completed in last 360 days      Failed - Valid encounter within last 3 months    Recent Outpatient Visits           3 months ago Lumbar herniated disc   [facility] Malva Limes, MD   5 months ago Primary hypertension   [facility] Malva Limes, MD   6 months ago Annual physical exam   [facility] Malva Limes, MD   7 months ago Essential (primary) hypertension   [facility] Malva Limes, MD   9 months ago Lumbar herniated disc   [facility] Malva Limes, MD       Future Appointments             In 1 week Fisher, Demetrios Isaacs, MD [facility]

## 2022-03-15 NOTE — Telephone Encounter (Signed)
  Chief Complaint: medication RF-back pain Symptoms: patient states he now lives alone and over did it out in the yard this weekend. Patient is requesting RF of pain medication- he is at pharmacy now.  Frequency:   Pertinent Negatives: Patient denies   Disposition: [] ED /[] Urgent Care (no appt availability in office) / [] Appointment(In office/virtual)/ []  [facility]/ [] Home Care/ [] Refused Recommended Disposition /[] [place] [facility]/ [x]  Follow-up with PCP Additional Notes: Patient advised message /Rx is pended for provider review.   Reason for Disposition  Caller requesting a CONTROLLED substance prescription refill (e.g., narcotics, ADHD medicines)  Answer Assessment - Initial Assessment Questions 1. ONSET: "When did the pain begin?"      Back pain 2. LOCATION: "Where does it hurt?" (upper, mid or lower back)     Chronic back pain- sciatic pain left leg 3. SEVERITY: "How bad is the pain?"  (e.g., Scale 1-10; mild, moderate, or severe)   - MILD (1-3): Doesn't interfere with normal activities.    - MODERATE (4-7): Interferes with normal activities or awakens from sleep.    - SEVERE (8-10): Excruciating pain, unable to do any normal activities.      severe 4. PATTERN: "Is the pain constant?" (e.g., yes, no; constant, intermittent)      *No Answer* 5. RADIATION: "Does the pain shoot into your legs or somewhere else?"     Left leg 6. CAUSE:  "What do you think is causing the back pain?"      *No Answer* 7. BACK OVERUSE:  "Any recent lifting of heavy objects, strenuous work or exercise?"     Patient is living alone- over worked 8. MEDICINES: "What have you taken so far for the pain?" (e.g., nothing, acetaminophen, NSAIDS)     *No Answer* 9. NEUROLOGIC SYMPTOMS: "Do you have any weakness, numbness, or problems with bowel/bladder control?"     *No Answer* 10. OTHER SYMPTOMS: "Do you have any other symptoms?" (e.g., fever, abdomen pain, burning with urination, blood in  urine)       *No Answer* 11. PREGNANCY: "Is there any chance you are pregnant?" "When was your last menstrual period?"       *No Answer*  Answer Assessment - Initial Assessment Questions 1. DRUG NAME: "What medicine do you need to have refilled?"     oxycodone 2. REFILLS REMAINING: "How many refills are remaining?" (Note: The label on the medicine or pill bottle will show how many refills are remaining. If there are no refills remaining, then a renewal may be needed.)     none 3. EXPIRATION DATE: "What is the expiration date?" (Note: The label states when the prescription will expire, and thus can no longer be refilled.)       4. PRESCRIBING HCP: "Who prescribed it?" Reason: If prescribed by specialist, call should be referred to that group.     Fisher 5. SYMPTOMS: "Do you have any symptoms?"     Back pain Patient is at pharmacy now- patient advised his message/request has been sent to provider and is pending review/refill. Will send another message- but it may not be in the next 5-10 minutes.  Protocols used: Back Pain-A-AH, Medication Refill and Renewal Call-A-AH

## 2022-03-15 NOTE — Telephone Encounter (Signed)
Pt called again regarding missing pain medication. Pt states he is starting to panic and pain is excruciating.   Suggested that perhaps pt should go to ED for care. Pt refused .  Pt would like medication called in ASAP.

## 2022-03-24 NOTE — Progress Notes (Signed)
Established patient visit   Patient: Dylan [name]   DOB: 03-09-1956   66 y.o. Male  MRN: [id number] Visit Date: 03/25/2022  Today's healthcare provider: [name], MD   Chief Complaint  Patient presents with   Back Pain   Depression   Hypertension   Subjective    HPI  Follow up for chronic back pain:  The patient was last seen for this 3 months ago. Changes made at last visit include none. Continue same medication. Encouraged to continue exercising and work on weight loss.  He reports good compliance with treatment. He feels that condition is Worse. He is not having side effects.   He was last seen by Dr. Lovell Sheehan in December and had planned on scheduling laminectomy, but he has not been able to proceed due to financial and scheduling issues.  -----------------------------------------------------------------------------------------   Depression, Follow-up  He  was last seen for this 3 months ago. Changes made at last visit include none; continue same dose of fluoxetine.   He reports good compliance with treatment. He is not having side effects.   He reports good tolerance of treatment. Current symptoms include: depressed mood He feels he is Unchanged since last visit.     03/25/2022    9:51 AM 12/06/2021    1:08 PM 09/03/2021    9:55 AM  Depression screen PHQ 2/9  Decreased Interest 3 3 0  Down, Depressed, Hopeless 3 3 3   PHQ - 2 Score 6 6 3   Altered sleeping 3 3 3   Tired, decreased energy 3 2 1   Change in appetite 2 0 2  Feeling bad or failure about yourself  2 0 1  Trouble concentrating 2 3 2   Moving slowly or fidgety/restless 2 0 1  Suicidal thoughts 0 0 0  PHQ-9 Score 20 14 13   Difficult doing work/chores Not difficult at all Somewhat difficult Not difficult at all    -----------------------------------------------------------------------------------------  Hypertension, follow-up  BP Readings from Last 3 Encounters:  03/25/22 139/81  12/06/21  120/69  10/08/21 138/87   Wt Readings from Last 3 Encounters:  03/25/22 185 lb (83.9 kg)  12/06/21 199 lb (90.3 kg)  10/08/21 183 lb (83 kg)     He was last seen for hypertension 5 months ago.  BP at that visit was 138/87. Management since that visit includes continue same medication.  He reports good compliance with treatment. He is not having side effects.  He is following a Regular diet. He is not exercising. He does not smoke.  Use of agents associated with hypertension: NSAIDS.   Outside blood pressures are not checked. Symptoms: Yes chest pain No chest pressure  No palpitations No syncope  No dyspnea No orthopnea  No paroxysmal nocturnal dyspnea No lower extremity edema      ---------------------------------------------------------------------------------------------------   Medications: Outpatient Medications Prior to Visit  Medication Sig   amLODipine (NORVASC) 10 MG tablet Take 1 tablet (10 mg total) by mouth daily.   aspirin EC 81 MG tablet Take 1 tablet (81 mg total) by mouth every other day. Swallow whole.   celecoxib (CELEBREX) 100 MG capsule TAKE 1 CAPSULE (100 MG TOTAL) BY MOUTH 2 (TWO) TIMES DAILY. FOR BACK PAIN   cyclobenzaprine (FLEXERIL) 10 MG tablet TAKE 1 TABLET BY MOUTH 3 TIMES DAILY AS NEEDED FOR MUSCLE SPASM(S)   diclofenac (VOLTAREN) 50 MG EC tablet Take 1 tablet (50 mg total) by mouth 2 (two) times daily. Take with food   FLUoxetine (PROZAC) 20  MG capsule TAKE 1 CAPSULE BY MOUTH EVERY DAY   fluticasone (FLONASE) 50 MCG/ACT nasal spray Place 2 sprays into both nostrils daily.   gabapentin (NEURONTIN) 300 MG capsule Take 2 capsules (600 mg total) by mouth at bedtime. For sciatic and nerve pain   HYDROcodone-acetaminophen (NORCO) 10-325 MG tablet Take 1-2 tablets by mouth every 6 (six) hours as needed for severe pain.   lidocaine (LIDODERM) 5 % Place 1 patch onto the skin every 12 (twelve) hours. Remove & Discard patch within 12 hours or as directed  by MD   LORazepam (ATIVAN) 1 MG tablet TAKE 1 TABLET (1 MG TOTAL) BY MOUTH EVERY 6 (SIX) HOURS AS NEEDED. FOR ANXIETY   Melatonin 3 MG CAPS Take 3 mg by mouth as needed.    metoprolol succinate (TOPROL-XL) 25 MG 24 hr tablet Take 1 tablet (25 mg total) by mouth daily.   Niacin (VITAMIN B-3 PO) Take 1 tablet by mouth daily.   omeprazole (PRILOSEC) 40 MG capsule TAKE 1 CAPSULE(40 MG) BY MOUTH DAILY   OVER THE COUNTER MEDICATION CEREBRA 2 TABLETS DAILY   rosuvastatin (CRESTOR) 5 MG tablet Take 1 tablet (5 mg total) by mouth daily.   tadalafil (CIALIS) 20 MG tablet TAKE 1/2-1 TABLET BY MOUTH EVERY OTHER DAY AS NEEDED FOR ERECTILE DYSFUNCTION   TESTOSTERONE NA Take 3 tablets by mouth daily. Nugenix   No facility-administered medications prior to visit.    Review of Systems  Constitutional:  Negative for appetite change, chills and fever.  Respiratory:  Positive for shortness of breath. Negative for chest tightness and wheezing.   Cardiovascular:  Negative for chest pain and palpitations.  Gastrointestinal:  Negative for abdominal pain, nausea and vomiting.  Musculoskeletal:  Positive for arthralgias (right leg pain).  Psychiatric/Behavioral:  Positive for sleep disturbance (trouble falling asleep). The patient is nervous/anxious.        Objective    BP 139/81 (BP Location: Right Arm, Patient Position: Sitting, Cuff Size: Large)   Pulse 75   Temp 97.7 F (36.5 C) (Oral)   Resp 18   Wt 185 lb (83.9 kg)   SpO2 100% Comment: room air  BMI 26.54 kg/m    Physical Exam   .diagm  Assessment & Plan     1. Primary hypertension Fairly well controlled, exacerbated by worsening back pain.   2. Hyperlipidemia, mixed  - CBC - Comprehensive metabolic panel - Lipid panel  3. Aortic atherosclerosis (HCC) He is tolerating rosuvastatin well with no adverse effects.    4. Lumbar herniated disc  5. Chronic back pain, unspecified back location, unspecified back pain laterality Last seen  Dr. Lovell Sheehan in December and planned on proceeding with laminectomy which he has not yet scheduled. He is going to go ahead and schedule follow up with Dr. Lovell Sheehan, in the meaning can take - HYDROmorphone (DILAUDID) 2 MG tablet; Take 1 tablet (2 mg total) by mouth 2 (two) times daily as needed for severe pain.  Dispense: 30 tablet for breakthrough pain  6. Panic attacks Fairly well controlled on current dose of lorazepam and fluoxetine.   7. Prostate cancer screening  - PSA Total (Reflex To Free)      The entirety of the information documented in the History of Present Illness, Review of Systems and Physical Exam were personally obtained by me. Portions of this information were initially documented by the CMA and reviewed by me for thoroughness and accuracy.     [name], MD  [facility] [phone]  phone) [phone] (fax)  [place]

## 2022-03-25 ENCOUNTER — Encounter: Payer: Self-pay | Admitting: Family Medicine

## 2022-03-25 ENCOUNTER — Ambulatory Visit (INDEPENDENT_AMBULATORY_CARE_PROVIDER_SITE_OTHER): Payer: Medicare Other | Admitting: Family Medicine

## 2022-03-25 VITALS — BP 139/81 | HR 75 | Temp 97.7°F | Resp 18 | Wt 185.0 lb

## 2022-03-25 DIAGNOSIS — M549 Dorsalgia, unspecified: Secondary | ICD-10-CM

## 2022-03-25 DIAGNOSIS — G8929 Other chronic pain: Secondary | ICD-10-CM | POA: Diagnosis not present

## 2022-03-25 DIAGNOSIS — M5126 Other intervertebral disc displacement, lumbar region: Secondary | ICD-10-CM

## 2022-03-25 DIAGNOSIS — I10 Essential (primary) hypertension: Secondary | ICD-10-CM

## 2022-03-25 DIAGNOSIS — F410 Panic disorder [episodic paroxysmal anxiety] without agoraphobia: Secondary | ICD-10-CM

## 2022-03-25 DIAGNOSIS — I700 Atherosclerosis of aorta: Secondary | ICD-10-CM

## 2022-03-25 DIAGNOSIS — E782 Mixed hyperlipidemia: Secondary | ICD-10-CM | POA: Diagnosis not present

## 2022-03-25 DIAGNOSIS — Z125 Encounter for screening for malignant neoplasm of prostate: Secondary | ICD-10-CM

## 2022-03-25 MED ORDER — HYDROMORPHONE HCL 2 MG PO TABS: 2.0000 mg | ORAL_TABLET | Freq: Two times a day (BID) | ORAL | 0 refills | Status: DC | PRN

## 2022-03-26 LAB — COMPREHENSIVE METABOLIC PANEL
ALT: 12 IU/L (ref 0–44)
AST: 11 IU/L (ref 0–40)
Albumin/Globulin Ratio: 2.2 (ref 1.2–2.2)
Albumin: 4.9 g/dL (ref 3.9–4.9)
Alkaline Phosphatase: 109 IU/L (ref 44–121)
BUN/Creatinine Ratio: 14 (ref 10–24)
BUN: 13 mg/dL (ref 8–27)
Bilirubin Total: 0.3 mg/dL (ref 0.0–1.2)
CO2: 25 mmol/L (ref 20–29)
Calcium: 9.8 mg/dL (ref 8.6–10.2)
Chloride: 100 mmol/L (ref 96–106)
Creatinine, Ser: 0.93 mg/dL (ref 0.76–1.27)
Globulin, Total: 2.2 g/dL (ref 1.5–4.5)
Glucose: 111 mg/dL — ABNORMAL HIGH (ref 70–99)
Potassium: 5 mmol/L (ref 3.5–5.2)
Sodium: 140 mmol/L (ref 134–144)
Total Protein: 7.1 g/dL (ref 6.0–8.5)
eGFR: 91 mL/min/{1.73_m2} (ref >59)

## 2022-03-26 LAB — LIPID PANEL
Chol/HDL Ratio: 3.1 ratio (ref 0.0–5.0)
Cholesterol, Total: 183 mg/dL (ref 100–199)
HDL: 59 mg/dL (ref >39)
LDL Chol Calc (NIH): 90 mg/dL (ref 0–99)
Triglycerides: 205 mg/dL — ABNORMAL HIGH (ref 0–149)
VLDL Cholesterol Cal: 34 mg/dL (ref 5–40)

## 2022-03-26 LAB — CBC
Hematocrit: 43.3 % (ref 37.5–51.0)
Hemoglobin: 14.8 g/dL (ref 13.0–17.7)
MCH: 31.9 pg (ref 26.6–33.0)
MCHC: 34.2 g/dL (ref 31.5–35.7)
MCV: 93 fL (ref 79–97)
Platelets: 302 10*3/uL (ref 150–450)
RBC: 4.64 x10E6/uL (ref 4.14–5.80)
RDW: 12.7 % (ref 11.6–15.4)
WBC: 11.4 10*3/uL — ABNORMAL HIGH (ref 3.4–10.8)

## 2022-03-26 LAB — PSA TOTAL (REFLEX TO FREE): Prostate Specific Ag, Serum: 0.3 ng/mL (ref 0.0–4.0)

## 2022-03-31 ENCOUNTER — Other Ambulatory Visit: Payer: Self-pay | Admitting: Family Medicine

## 2022-03-31 DIAGNOSIS — M5126 Other intervertebral disc displacement, lumbar region: Secondary | ICD-10-CM

## 2022-04-01 NOTE — Telephone Encounter (Signed)
Pt called back to report that he will be completely out by tomorrow and says that he needs his refill by tomorrow. [facility] [place] [facility]. Says that the hydrocodone helps him the best.

## 2022-04-02 MED ORDER — HYDROCODONE-ACETAMINOPHEN 10-325 MG PO TABS: 1.0000 | ORAL_TABLET | Freq: Four times a day (QID) | ORAL | 0 refills | Status: DC | PRN

## 2022-04-10 ENCOUNTER — Other Ambulatory Visit: Payer: Self-pay | Admitting: Family Medicine

## 2022-04-10 DIAGNOSIS — G8929 Other chronic pain: Secondary | ICD-10-CM

## 2022-04-10 MED ORDER — HYDROMORPHONE HCL 2 MG PO TABS: 2.0000 mg | ORAL_TABLET | Freq: Two times a day (BID) | ORAL | 0 refills | Status: DC | PRN

## 2022-04-12 ENCOUNTER — Other Ambulatory Visit: Payer: Self-pay | Admitting: Family Medicine

## 2022-04-12 DIAGNOSIS — F430 Acute stress reaction: Secondary | ICD-10-CM

## 2022-04-12 DIAGNOSIS — F410 Panic disorder [episodic paroxysmal anxiety] without agoraphobia: Secondary | ICD-10-CM

## 2022-04-18 ENCOUNTER — Other Ambulatory Visit: Payer: Self-pay | Admitting: Family Medicine

## 2022-04-18 DIAGNOSIS — M5126 Other intervertebral disc displacement, lumbar region: Secondary | ICD-10-CM

## 2022-04-20 ENCOUNTER — Telehealth: Payer: Self-pay | Admitting: Family Medicine

## 2022-04-20 DIAGNOSIS — M5126 Other intervertebral disc displacement, lumbar region: Secondary | ICD-10-CM

## 2022-04-20 MED ORDER — HYDROCODONE-ACETAMINOPHEN 10-325 MG PO TABS: 1.0000 | ORAL_TABLET | Freq: Four times a day (QID) | ORAL | 0 refills | Status: DC | PRN

## 2022-04-20 NOTE — Telephone Encounter (Signed)
[facility] the  HYDROcodone-acetaminophen (NORCO) 10-325 MG tablet And they do not know when they will get it back in stock. His Rx sent 10/02. Pt requesting the Rx be sent to   [place], [place] states they DO have in stock. Thank you.

## 2022-04-27 ENCOUNTER — Other Ambulatory Visit: Payer: Self-pay | Admitting: Family Medicine

## 2022-04-27 DIAGNOSIS — G8929 Other chronic pain: Secondary | ICD-10-CM

## 2022-04-28 MED ORDER — HYDROMORPHONE HCL 2 MG PO TABS: 2.0000 mg | ORAL_TABLET | Freq: Two times a day (BID) | ORAL | 0 refills | Status: DC | PRN

## 2022-05-05 ENCOUNTER — Other Ambulatory Visit: Payer: Self-pay | Admitting: Family Medicine

## 2022-05-05 DIAGNOSIS — M5126 Other intervertebral disc displacement, lumbar region: Secondary | ICD-10-CM

## 2022-05-05 NOTE — Telephone Encounter (Signed)
Requested medication (s) are due for refill today: no  Requested medication (s) are on the active medication list: yes  Last refill:  04/20/22 #168/0  Future visit scheduled: no  Notes to clinic:  Unable to refill per protocol, cannot delegate. Per [facility] pt due for refill on 05/19/22.      Requested Prescriptions  Pending Prescriptions Disp Refills   HYDROcodone-acetaminophen (NORCO) 10-325 MG tablet 168 tablet 0    Sig: Take 1-2 tablets by mouth every 6 (six) hours as needed for severe pain.     Not Delegated - Analgesics:  Opioid Agonist Combinations Failed - 05/05/2022  3:42 PM      Failed - This refill cannot be delegated      Failed - Urine Drug Screen completed in last 360 days      Passed - Valid encounter within last 3 months    Recent Outpatient Visits           1 month ago Primary hypertension   [facility] Birdie Sons, MD   5 months ago Lumbar herniated disc   [facility] Birdie Sons, MD   6 months ago Primary hypertension   [facility] Birdie Sons, MD   8 months ago Annual physical exam   [facility] Birdie Sons, MD   8 months ago Essential (primary) hypertension   [facility] Caryn Section, Kirstie Peri, MD

## 2022-05-05 NOTE — Telephone Encounter (Signed)
Medication Refill - Medication: HYDROcodone-acetaminophen (NORCO) 10-325 MG tablet  Has the patient contacted their pharmacy? Yes.   (He states this Rx is due on Sunday, but [facility] is closed on Sunday.  Can pick up on Sat if Rx gets sent.  Preferred Pharmacy (with phone number or street name): [facility] - [place], [place].  Has the patient been seen for an appointment in the last year OR does the patient have an upcoming appointment? Yes.    Pt states [facility] has a limited supply of hydro, so if we can get this over to them asap.  Agent: Please be advised that RX refills may take up to 3 business days. We ask that you follow-up with your pharmacy.

## 2022-05-05 NOTE — Telephone Encounter (Signed)
[facility] called, spoke with Sam, [facility] who states that pt pu Norco 10/325 since 04/20/22. The way the directions are written the rx is good for 21 days but getting rx every 30 days. He also states pt wouldn't be due for refill until 05/19/22. Advised I would send back to provider since not delegated.

## 2022-05-06 ENCOUNTER — Other Ambulatory Visit: Payer: Self-pay | Admitting: Family Medicine

## 2022-05-06 DIAGNOSIS — M5126 Other intervertebral disc displacement, lumbar region: Secondary | ICD-10-CM

## 2022-05-06 DIAGNOSIS — F430 Acute stress reaction: Secondary | ICD-10-CM

## 2022-05-06 DIAGNOSIS — F339 Major depressive disorder, recurrent, unspecified: Secondary | ICD-10-CM

## 2022-05-06 DIAGNOSIS — F410 Panic disorder [episodic paroxysmal anxiety] without agoraphobia: Secondary | ICD-10-CM

## 2022-05-06 NOTE — Telephone Encounter (Signed)
Pt stated he was told by his pharmacy that the Rx for HYDROcodone-acetaminophen (NORCO) 10-325 MG tablet can not be filled until November 1 but the Rx was only for a 21 day supply.  Medication Refill - Medication:  HYDROcodone-acetaminophen (NORCO) 10-325 MG tablet  Has the patient contacted their pharmacy? Yes.    Preferred Pharmacy (with phone number or street name):  [place], [place]. Phone:  [phone]  Fax:  [phone]     Has the patient been seen for an appointment in the last year OR does the patient have an upcoming appointment? Yes.    Agent: Please be advised that RX refills may take up to 3 business days. We ask that you follow-up with your pharmacy.

## 2022-05-06 NOTE — Telephone Encounter (Signed)
Requested medication (s) are due for refill today: Yes  Requested medication (s) are on the active medication list: Yes  Last refill:  04/20/22  Future visit scheduled: Yes  Notes to clinic:  Unable to refill per protocol, cannot delegate.      Requested Prescriptions  Pending Prescriptions Disp Refills   HYDROcodone-acetaminophen (NORCO) 10-325 MG tablet 168 tablet 0    Sig: Take 1-2 tablets by mouth every 6 (six) hours as needed for severe pain.     Not Delegated - Analgesics:  Opioid Agonist Combinations Failed - 05/06/2022  4:56 PM      Failed - This refill cannot be delegated      Failed - Urine Drug Screen completed in last 360 days      Passed - Valid encounter within last 3 months    Recent Outpatient Visits           1 month ago Primary hypertension   [facility] Birdie Sons, MD   5 months ago Lumbar herniated disc   [facility] Birdie Sons, MD   7 months ago Primary hypertension   [facility] Birdie Sons, MD   8 months ago Annual physical exam   [facility] Birdie Sons, MD   9 months ago Essential (primary) hypertension   [facility] Caryn Section, Kirstie Peri, MD

## 2022-05-06 NOTE — Progress Notes (Signed)
I,Roshena L Chambers,acting as a scribe for Lelon Huh, MD.,have documented all relevant documentation on the behalf of Lelon Huh, MD,as directed by  Lelon Huh, MD while in the presence of Lelon Huh, MD.    Established patient visit   Patient: Dylan [name]   DOB: 16-Oct-1955   66 y.o. Male  MRN: [id number] Visit Date: 05/09/2022  Today's healthcare provider: Lelon Huh, MD   Chief Complaint  Patient presents with   Anxiety   Subjective    HPI  Anxiety, Follow-up  He was last seen for anxiety 1 months ago, at which time he was doing well with lorazepam and fluoxetine.  Changes made at last visit include no change.   He reports fair compliance with treatment.  He reports good tolerance of treatment. He is not having side effects.   He feels his anxiety is severe and Worse since last visit. Apparently there's been a lot of conflict between family members.   Symptoms: No chest pain Yes difficulty concentrating  No dizziness Yes fatigue  Yes feelings of losing control Yes insomnia  No irritable No palpitations  Yes panic attacks No racing thoughts  No shortness of breath No sweating  No tremors/shakes    GAD-7 Results    05/09/2022   10:43 AM 12/06/2021    1:06 PM  GAD-7 Generalized Anxiety Disorder Screening Tool  1. Feeling Nervous, Anxious, or on Edge 3 3  2. Not Being Able to Stop or Control Worrying 3 3  3. Worrying Too Much About Different Things 3 3  4. Trouble Relaxing 3 1  5. Being So Restless it's Hard To Sit Still 2 1  6. Becoming Easily Annoyed or Irritable 3 1  7. Feeling Afraid As If Something Awful Might Happen 3 3  Total GAD-7 Score 20 15  Difficulty At Work, Home, or Getting  Along With Others? Very difficult Not difficult at all    PHQ-9 Scores    05/09/2022   10:40 AM 03/25/2022    9:51 AM 12/06/2021    1:08 PM  PHQ9 SCORE ONLY  PHQ-9 Total Score 22 20 14      ---------------------------------------------------------------------------------------------------  Follow up for chronic pain:  Patient I having trouble getting prescription pain medications filled. He is currently out of hydrocodone.  He reports good compliance with treatment. He feels that condition is Worse. He states he was doing a lot of work around the home last week causing worsening of his pain. He usually takes up to 5 hydrocodone/apap every day, but has had to take a few more each day of the last week.   -----------------------------------------------------------------------------------------     Medications: Outpatient Medications Prior to Visit  Medication Sig   amLODipine (NORVASC) 10 MG tablet Take 1 tablet (10 mg total) by mouth daily.   aspirin EC 81 MG tablet Take 1 tablet (81 mg total) by mouth every other day. Swallow whole.   celecoxib (CELEBREX) 100 MG capsule TAKE 1 CAPSULE (100 MG TOTAL) BY MOUTH 2 (TWO) TIMES DAILY. FOR BACK PAIN   cyclobenzaprine (FLEXERIL) 10 MG tablet TAKE 1 TABLET BY MOUTH 3 TIMES DAILY AS NEEDED FOR MUSCLE SPASM(S)   diclofenac (VOLTAREN) 50 MG EC tablet Take 1 tablet (50 mg total) by mouth 2 (two) times daily. Take with food   FLUoxetine (PROZAC) 20 MG capsule TAKE 1 CAPSULE BY MOUTH EVERY DAY   fluticasone (FLONASE) 50 MCG/ACT nasal spray Place 2 sprays into both nostrils daily.   gabapentin (NEURONTIN) 300  MG capsule Take 2 capsules (600 mg total) by mouth at bedtime. For sciatic and nerve pain   HYDROcodone-acetaminophen (NORCO) 10-325 MG tablet Take 1-2 tablets by mouth every 6 (six) hours as needed for severe pain.   HYDROmorphone (DILAUDID) 2 MG tablet Take 1 tablet (2 mg total) by mouth 2 (two) times daily as needed for severe pain.   lidocaine (LIDODERM) 5 % Place 1 patch onto the skin every 12 (twelve) hours. Remove & Discard patch within 12 hours or as directed by MD   LORazepam (ATIVAN) 1 MG tablet TAKE 1 TABLET BY MOUTH  EVERY 6 HOURS AS NEEDED FOR ANXIETY   Melatonin 3 MG CAPS Take 3 mg by mouth as needed.    metoprolol succinate (TOPROL-XL) 25 MG 24 hr tablet Take 1 tablet (25 mg total) by mouth daily.   Niacin (VITAMIN B-3 PO) Take 1 tablet by mouth daily.   omeprazole (PRILOSEC) 40 MG capsule TAKE 1 CAPSULE(40 MG) BY MOUTH DAILY   OVER THE COUNTER MEDICATION CEREBRA 2 TABLETS DAILY   rosuvastatin (CRESTOR) 5 MG tablet Take 1 tablet (5 mg total) by mouth daily.   tadalafil (CIALIS) 20 MG tablet TAKE 1/2-1 TABLET BY MOUTH EVERY OTHER DAY AS NEEDED FOR ERECTILE DYSFUNCTION   TESTOSTERONE NA Take 3 tablets by mouth daily. Nugenix   No facility-administered medications prior to visit.    Review of Systems  Constitutional:  Positive for fatigue. Negative for appetite change, chills and fever.  Respiratory:  Negative for chest tightness, shortness of breath and wheezing.   Cardiovascular:  Negative for chest pain and palpitations.  Gastrointestinal:  Negative for abdominal pain, nausea and vomiting.  Psychiatric/Behavioral:  Positive for agitation and sleep disturbance. The patient is nervous/anxious.        Objective    BP 112/72 (BP Location: Right Arm, Patient Position: Sitting, Cuff Size: Large)   Pulse 72   Temp 98.6 F (37 C) (Oral)   Resp 20   Wt 181 lb (82.1 kg)   SpO2 100% Comment: room air  BMI 25.97 kg/m    Physical Exam   General appearance: Well developed, well nourished male, cooperative and in no acute distress Head: Normocephalic, without obvious abnormality, atraumatic Respiratory: Respirations even and unlabored, normal respiratory rate Extremities: All extremities are intact.  Skin: Skin color, texture, turgor normal. No rashes seen  Psych: Appropriate mood and affect. Neurologic: Mental status: Alert, oriented to person, place, and time, thought content appropriate.   Assessment & Plan     1. Panic attacks   2. Anxiety Is tolerating fluoxetine well, but has had much  more stress lately due to family conflicts. Continue same dose of lorazepam, increase fluoxetine to 40mg  daily. Is to follow up in a month.   3. Lumbar herniated disc Worse over the last week due to strain from work around the house and yard.  Refill hydrocodone/apap   4. Need for immunization against influenza  - Flu Vaccine QUAD High Dose(Fluad)      The entirety of the information documented in the History of Present Illness, Review of Systems and Physical Exam were personally obtained by me. Portions of this information were initially documented by the CMA and reviewed by me for thoroughness and accuracy.     , MD  [facility] [phone] (phone) [phone] (fax)  [facility]

## 2022-05-09 ENCOUNTER — Encounter: Payer: Self-pay | Admitting: Family Medicine

## 2022-05-09 ENCOUNTER — Ambulatory Visit (INDEPENDENT_AMBULATORY_CARE_PROVIDER_SITE_OTHER): Payer: Medicare Other | Admitting: Family Medicine

## 2022-05-09 VITALS — BP 112/72 | HR 72 | Temp 98.6°F | Resp 20 | Wt 181.0 lb

## 2022-05-09 DIAGNOSIS — Z23 Encounter for immunization: Secondary | ICD-10-CM

## 2022-05-09 DIAGNOSIS — F419 Anxiety disorder, unspecified: Secondary | ICD-10-CM

## 2022-05-09 DIAGNOSIS — M5126 Other intervertebral disc displacement, lumbar region: Secondary | ICD-10-CM | POA: Diagnosis not present

## 2022-05-09 DIAGNOSIS — F410 Panic disorder [episodic paroxysmal anxiety] without agoraphobia: Secondary | ICD-10-CM | POA: Diagnosis not present

## 2022-05-09 MED ORDER — FLUOXETINE HCL 40 MG PO CAPS: ORAL_CAPSULE | ORAL | 1 refills | Status: AC

## 2022-05-09 MED ORDER — HYDROCODONE-ACETAMINOPHEN 10-325 MG PO TABS: 1.0000 | ORAL_TABLET | Freq: Four times a day (QID) | ORAL | 0 refills | Status: DC | PRN

## 2022-05-09 MED ORDER — LORAZEPAM 1 MG PO TABS: 1.0000 mg | ORAL_TABLET | Freq: Four times a day (QID) | ORAL | 3 refills | Status: DC | PRN

## 2022-05-09 NOTE — Patient Instructions (Signed)
.   Please review the attached list of medications and notify my office if there are any errors.   . Please bring all of your medications to every appointment so we can make sure that our medication list is the same as yours.   

## 2022-05-16 ENCOUNTER — Other Ambulatory Visit: Payer: Self-pay | Admitting: Family Medicine

## 2022-05-16 DIAGNOSIS — G8929 Other chronic pain: Secondary | ICD-10-CM

## 2022-05-17 MED ORDER — HYDROMORPHONE HCL 2 MG PO TABS: 2.0000 mg | ORAL_TABLET | Freq: Two times a day (BID) | ORAL | 0 refills | Status: DC | PRN

## 2022-05-20 ENCOUNTER — Other Ambulatory Visit: Payer: Self-pay | Admitting: Family Medicine

## 2022-05-20 DIAGNOSIS — G8929 Other chronic pain: Secondary | ICD-10-CM

## 2022-05-20 MED ORDER — HYDROMORPHONE HCL 2 MG PO TABS: 2.0000 mg | ORAL_TABLET | Freq: Two times a day (BID) | ORAL | 0 refills | Status: DC | PRN

## 2022-05-20 NOTE — Telephone Encounter (Signed)
[facility]  HYDROmorphone (DILAUDID) 2 MG tablet  (And cannot get it) He is needing asap. Can you please transfer to   [place], [place].

## 2022-05-26 ENCOUNTER — Other Ambulatory Visit: Payer: Self-pay | Admitting: Family Medicine

## 2022-05-26 DIAGNOSIS — M5126 Other intervertebral disc displacement, lumbar region: Secondary | ICD-10-CM

## 2022-05-26 DIAGNOSIS — E782 Mixed hyperlipidemia: Secondary | ICD-10-CM

## 2022-05-30 ENCOUNTER — Ambulatory Visit: Payer: Self-pay | Admitting: *Deleted

## 2022-05-30 ENCOUNTER — Telehealth: Payer: Self-pay

## 2022-05-30 MED ORDER — HYDROCODONE-ACETAMINOPHEN 10-325 MG PO TABS: 1.0000 | ORAL_TABLET | Freq: Four times a day (QID) | ORAL | 0 refills | Status: DC | PRN

## 2022-05-30 NOTE — Telephone Encounter (Signed)
Copied from CRM [id number]. Topic: General - Other >> May 30, 2022  9:44 AM Franchot Heidelberg wrote: Reason for CRM: Sam from [facility] called requesting a call back from Dr. Sherrie Mustache, please advise

## 2022-05-30 NOTE — Telephone Encounter (Signed)
  Chief Complaint: Sam with [facility] needs clarification on the Norco dosing.  Call him at [phone]. Symptoms: Does Dr. Sherrie Mustache want this renewed every 21 days?   Depending on how pt takes the drug the instructions are vague and pharmacist doesn't know how often this can be renewed. Frequency: N/A Pertinent Negatives: Patient denies N/A Disposition: [] ED /[] Urgent Care (no appt availability in office) / [] Appointment(In office/virtual)/ []  [place] [facility]/ [] Home Care/ [] Refused Recommended Disposition /[] [place] [facility]/ [x]  Follow-up with PCP Additional Notes: Message sent to [facility] to Dr. .    Pt. Is at the pharmacy.

## 2022-05-30 NOTE — Telephone Encounter (Signed)
Reason for Disposition  [1] Pharmacy calling with prescription question AND [2] triager unable to answer question  Answer Assessment - Initial Assessment Questions 1. NAME of MEDICINE: "What medicine(s) are you calling about?"     Hydrocodone  Norco 2. QUESTION: "What is your question?" (e.g., double dose of medicine, side effect)     Does Dr. Sherrie Mustache want this renewed every 21 days?  The dosing instructions are vague depending on how the pt. Takes it.  Call Sam at [facility] [phone]  3. PRESCRIBER: "Who prescribed the medicine?" Reason: if prescribed by specialist, call should be referred to that group.     Dr. Sherrie Mustache How often does he want him to take it? 4. SYMPTOMS: "Do you have any symptoms?" If Yes, ask: "What symptoms are you having?"  "How bad are the symptoms (e.g., mild, moderate, severe)     N/A 5. PREGNANCY:  "Is there any chance that you are pregnant?" "When was your last menstrual period?"     N/A  Protocols used: Medication Question Call-A-AH

## 2022-05-30 NOTE — Telephone Encounter (Signed)
Spoke to pharmacy and they confirmed patient has picked up

## 2022-06-06 ENCOUNTER — Ambulatory Visit: Payer: Medicare Other | Admitting: Family Medicine

## 2022-06-07 ENCOUNTER — Ambulatory Visit (INDEPENDENT_AMBULATORY_CARE_PROVIDER_SITE_OTHER): Payer: Medicare Other | Admitting: Family Medicine

## 2022-06-07 ENCOUNTER — Encounter: Payer: Self-pay | Admitting: Family Medicine

## 2022-06-07 VITALS — BP 125/74 | HR 76 | Temp 97.8°F | Resp 20 | Wt 181.6 lb

## 2022-06-07 DIAGNOSIS — F419 Anxiety disorder, unspecified: Secondary | ICD-10-CM | POA: Diagnosis not present

## 2022-06-07 DIAGNOSIS — F32A Depression, unspecified: Secondary | ICD-10-CM | POA: Diagnosis not present

## 2022-06-07 DIAGNOSIS — M549 Dorsalgia, unspecified: Secondary | ICD-10-CM | POA: Diagnosis not present

## 2022-06-07 DIAGNOSIS — G8929 Other chronic pain: Secondary | ICD-10-CM

## 2022-06-07 DIAGNOSIS — M5126 Other intervertebral disc displacement, lumbar region: Secondary | ICD-10-CM

## 2022-06-07 MED ORDER — CELECOXIB 100 MG PO CAPS: 100.0000 mg | ORAL_CAPSULE | Freq: Two times a day (BID) | ORAL | 5 refills | Status: DC

## 2022-06-07 MED ORDER — HYDROMORPHONE HCL 2 MG PO TABS: 2.0000 mg | ORAL_TABLET | Freq: Two times a day (BID) | ORAL | 0 refills | Status: DC | PRN

## 2022-06-07 NOTE — Progress Notes (Signed)
I,Roshena L Chambers,acting as a scribe for [name], MD.,have documented all relevant documentation on the behalf of [name], MD,as directed by  [name], MD while in the presence of [name], MD.   Established patient visit   Patient: Dylan [name]   DOB: [birth year]   66 y.o. Male  MRN: [id number] Visit Date: 06/07/2022  Today's healthcare provider: [name], MD   Chief Complaint  Patient presents with   Anxiety   Subjective    HPI  Anxiety, Follow-up  He was last seen for anxiety 4 weeks ago. Changes made at last visit include; Continue same dose of lorazepam, increase fluoxetine to 40mg  daily. Is to follow up in a month.     He reports good compliance with treatment. He reports good tolerance of treatment. He is not having side effects.   He feels his anxiety is severe and Worse since last visit. He feels like the increased dose of fluoxetine is helping and is sleeping better since the dose change, but is having any more family related stress as his sister in has had medical problems and he has to go down to help her out. He is planning on leaving for [place] this coming weekend and will probably be staying for the winter.  Symptoms: No chest pain Yes difficulty concentrating  No dizziness No fatigue  No feelings of losing control Yes insomnia  No irritable No palpitations  Yes panic attacks Yes racing thoughts  Yes shortness of breath No sweating  Yes tremors/shakes    GAD-7 Results    05/09/2022   10:43 AM 12/06/2021    1:06 PM  GAD-7 Generalized Anxiety Disorder Screening Tool  1. Feeling Nervous, Anxious, or on Edge 3 3  2. Not Being Able to Stop or Control Worrying 3 3  3. Worrying Too Much About Different Things 3 3  4. Trouble Relaxing 3 1  5. Being So Restless it's Hard To Sit Still 2 1  6. Becoming Easily Annoyed or Irritable 3 1  7. Feeling Afraid As If Something Awful Might Happen 3 3  Total GAD-7 Score 20 15   Difficulty At Work, Home, or Getting  Along With Others? Very difficult Not difficult at all    PHQ-9 Scores    06/07/2022   11:40 AM 05/09/2022   10:40 AM 03/25/2022    9:51 AM  PHQ9 SCORE ONLY  PHQ-9 Total Score 17 22 20     ---------------------------------------------------------------------------------------------------   Medications: Outpatient Medications Prior to Visit  Medication Sig   amLODipine (NORVASC) 10 MG tablet Take 1 tablet (10 mg total) by mouth daily.   aspirin EC 81 MG tablet Take 1 tablet (81 mg total) by mouth every other day. Swallow whole.   celecoxib (CELEBREX) 100 MG capsule TAKE 1 CAPSULE (100 MG TOTAL) BY MOUTH 2 (TWO) TIMES DAILY. FOR BACK PAIN   cyclobenzaprine (FLEXERIL) 10 MG tablet TAKE 1 TABLET BY MOUTH 3 TIMES DAILY AS NEEDED FOR MUSCLE SPASM(S)   diclofenac (VOLTAREN) 50 MG EC tablet Take 1 tablet (50 mg total) by mouth 2 (two) times daily. Take with food   FLUoxetine (PROZAC) 40 MG capsule TAKE 1 CAPSULE BY MOUTH EVERY DAY   fluticasone (FLONASE) 50 MCG/ACT nasal spray Place 2 sprays into both nostrils daily.   gabapentin (NEURONTIN) 300 MG capsule Take 2 capsules (600 mg total) by mouth at bedtime. For sciatic and nerve pain   HYDROcodone-acetaminophen (NORCO) 10-325 MG tablet Take 1-2 tablets by mouth  every 6 (six) hours as needed for severe pain.   HYDROmorphone (DILAUDID) 2 MG tablet Take 1 tablet (2 mg total) by mouth 2 (two) times daily as needed for severe pain.   LORazepam (ATIVAN) 1 MG tablet Take 1 tablet (1 mg total) by mouth every 6 (six) hours as needed. for anxiety   Melatonin 3 MG CAPS Take 3 mg by mouth as needed.    metoprolol succinate (TOPROL-XL) 25 MG 24 hr tablet Take 1 tablet (25 mg total) by mouth daily.   Niacin (VITAMIN B-3 PO) Take 1 tablet by mouth daily.   omeprazole (PRILOSEC) 40 MG capsule TAKE 1 CAPSULE(40 MG) BY MOUTH DAILY   OVER THE COUNTER MEDICATION CEREBRA 2 TABLETS DAILY   rosuvastatin (CRESTOR) 5 MG tablet  TAKE 1 TABLET (5 MG TOTAL) BY MOUTH DAILY.   tadalafil (CIALIS) 20 MG tablet TAKE 1/2-1 TABLET BY MOUTH EVERY OTHER DAY AS NEEDED FOR ERECTILE DYSFUNCTION   TESTOSTERONE NA Take 3 tablets by mouth daily. Nugenix   No facility-administered medications prior to visit.    Review of Systems  Constitutional:  Negative for appetite change, chills and fever.  Respiratory:  Negative for chest tightness, shortness of breath and wheezing.   Cardiovascular:  Negative for chest pain and palpitations.  Gastrointestinal:  Negative for abdominal pain, nausea and vomiting.       Objective    BP 125/74 (BP Location: Right Arm, Patient Position: Sitting, Cuff Size: Large)   Pulse 76   Temp 97.8 F (36.6 C) (Oral)   Resp 20   Wt 181 lb 9.6 oz (82.4 kg)   SpO2 100% Comment: room air  BMI 26.06 kg/m    Physical Exam  General appearance: Well developed, well nourished male, cooperative and in no acute distress Head: Normocephalic, without obvious abnormality, atraumatic Respiratory: Respirations even and unlabored, normal respiratory rate Extremities: All extremities are intact.  Skin: Skin color, texture, turgor normal. No rashes seen  Psych: Appropriate mood and affect. Neurologic: Mental status: Alert, oriented to person, place, and time, thought content appropriate.   Assessment & Plan     1. Depression, unspecified depression type   2. Anxiety  He feels increased dose of fluoxetine has been helpful and does not want to add any additional medication, but family situation has become more stressful. He is planning on going to [place] to help take care his sister after Cristino Martes and may stay for a few months. Continue current medications for now.   3. Other chronic pain Continue current medications.    4. Lumbar herniated disc  5. Chronic back pain, unspecified back location, unspecified back pain laterality refill - HYDROmorphone (DILAUDID) 2 MG tablet; Take 1 tablet (2 mg total)  by mouth 2 (two) times daily as needed for severe pain.  Dispense: 30 tablet; Refill: 0      The entirety of the information documented in the History of Present Illness, Review of Systems and Physical Exam were personally obtained by me. Portions of this information were initially documented by the CMA and reviewed by me for thoroughness and accuracy.     Lelon Huh, MD  [facility] [phone] (phone) [phone] (fax)  [place]

## 2022-06-14 ENCOUNTER — Encounter: Payer: Self-pay | Admitting: Emergency Medicine

## 2022-06-14 ENCOUNTER — Other Ambulatory Visit: Payer: Self-pay

## 2022-06-14 ENCOUNTER — Telehealth: Payer: Self-pay

## 2022-06-14 ENCOUNTER — Emergency Department
Admission: EM | Admit: 2022-06-14 | Discharge: 2022-06-14 | Disposition: A | Discharge status: Home / Self Care | Payer: Medicare Other | Attending: Emergency Medicine | Admitting: Emergency Medicine

## 2022-06-14 ENCOUNTER — Emergency Department: Payer: Medicare Other

## 2022-06-14 DIAGNOSIS — M542 Cervicalgia: Secondary | ICD-10-CM | POA: Diagnosis not present

## 2022-06-14 DIAGNOSIS — M47812 Spondylosis without myelopathy or radiculopathy, cervical region: Secondary | ICD-10-CM | POA: Diagnosis not present

## 2022-06-14 DIAGNOSIS — W19XXXA Unspecified fall, initial encounter: Secondary | ICD-10-CM | POA: Diagnosis not present

## 2022-06-14 DIAGNOSIS — S12120A Other displaced dens fracture, initial encounter for closed fracture: Secondary | ICD-10-CM | POA: Diagnosis not present

## 2022-06-14 DIAGNOSIS — R519 Headache, unspecified: Secondary | ICD-10-CM | POA: Diagnosis not present

## 2022-06-14 DIAGNOSIS — W010XXA Fall on same level from slipping, tripping and stumbling without subsequent striking against object, initial encounter: Secondary | ICD-10-CM | POA: Insufficient documentation

## 2022-06-14 DIAGNOSIS — S12100A Unspecified displaced fracture of second cervical vertebra, initial encounter for closed fracture: Secondary | ICD-10-CM | POA: Diagnosis not present

## 2022-06-14 DIAGNOSIS — Z743 Need for continuous supervision: Secondary | ICD-10-CM | POA: Diagnosis not present

## 2022-06-14 DIAGNOSIS — R6889 Other general symptoms and signs: Secondary | ICD-10-CM | POA: Diagnosis not present

## 2022-06-14 MED ORDER — OXYCODONE-ACETAMINOPHEN 5-325 MG PO TABS: 1.0000 | ORAL_TABLET | Freq: Three times a day (TID) | ORAL | 0 refills | Status: DC | PRN

## 2022-06-14 MED ORDER — OXYCODONE-ACETAMINOPHEN 5-325 MG PO TABS
1.0000 | ORAL_TABLET | Freq: Once | ORAL | Status: AC
  Administered 2022-06-14: 1 via ORAL
  Filled 2022-06-14: qty 1

## 2022-06-14 NOTE — ED Notes (Signed)
Pt called family member and left message to be picked up

## 2022-06-14 NOTE — ED Provider Notes (Signed)
   [facility] Provider Note    Event Date/Time   First MD Initiated Contact with Patient 06/14/22 1040     (approximate)   History   Fall   HPI  Dylan [name] is a 66 y.o. male who presents for a fall.  Patient reports he fell 2 days ago and fell backwards against the wall, he reports that his neck has been hurting since then.  Not on blood thinners.  Does take 81 mg aspirin.  No other injuries reported     Physical Exam   Triage Vital Signs: ED Triage Vitals  Enc Vitals Group     BP 06/14/22 1039 (!) 132/93     Pulse Rate 06/14/22 1039 99     Resp 06/14/22 1039 16     Temp 06/14/22 1039 (!) 97.4 F (36.3 C)     Temp Source 06/14/22 1039 Oral     SpO2 06/14/22 1039 95 %     Weight 06/14/22 1017 83 kg (182 lb 15.7 oz)     Height 06/14/22 1017 1.778 m (5\' 10" )     Head Circumference --      Peak Flow --      Pain Score 06/14/22 1017 5     Pain Loc --      Pain Edu? --      Excl. in GC? --     Most recent vital signs: Vitals:   06/14/22 1039  BP: (!) 132/93  Pulse: 99  Resp: 16  Temp: (!) 97.4 F (36.3 C)  SpO2: 95%     General: Awake, no distress.  CV:  Good peripheral perfusion.  Resp:  Normal effort.  Abd:  No distention.  Other:  C-collar in place, normal strength in the upper extremities   ED Results / Procedures / Treatments   Labs (all labs ordered are listed, but only abnormal results are displayed) Labs Reviewed - No data to display   EKG     RADIOLOGY CT cervical spine viewed interpret by me, dens fracture noted    PROCEDURES:  Critical Care performed:   Procedures   MEDICATIONS ORDERED IN ED: Medications  oxyCODONE-acetaminophen (PERCOCET/ROXICET) 5-325 MG per tablet 1 tablet (1 tablet Oral Given 06/14/22 1229)     IMPRESSION / MDM / ASSESSMENT AND PLAN / ED COURSE  I reviewed the triage vital signs and the nursing notes. Patient's presentation is most consistent with acute presentation with  potential threat to life or bodily function.  Patient presents after a fall with neck pain as detailed above.  This occurred 2 days ago.  Will obtain CT imaging rule out fracture  Contacted by radiologist and notified of type III dens fracture, patient remains in collar  Have consulted Dr. 06/16/22 of neurosurgery, who recommends keeping collar in place, analgesics and he will see the patient in his office        FINAL CLINICAL IMPRESSION(S) / ED DIAGNOSES   Final diagnoses:  Closed odontoid fracture, initial encounter (HCC)     Rx / DC Orders   ED Discharge Orders          Ordered    oxyCODONE-acetaminophen (PERCOCET) 5-325 MG tablet  Every 8 hours PRN        06/14/22 1210             Note:  This document was prepared using Dragon voice recognition software and may include unintentional dictation errors.   06/16/22, MD 06/14/22 [phone]

## 2022-06-14 NOTE — Telephone Encounter (Signed)
Copied from CRM #[id number]. Topic: General - Other >> Jun 14, 2022 10:26 AM Chaz J wrote: Reason for CRM: Pt called and wanted to let Dr. Fisher know that he was in the hospital / he fell and thinks he has injured his neck / please advise 

## 2022-06-14 NOTE — ED Triage Notes (Signed)
Presents via EMS s/p fall  2 days ago  Having pain to neck States pain became worse today

## 2022-06-15 ENCOUNTER — Ambulatory Visit: Payer: Self-pay

## 2022-06-15 NOTE — Telephone Encounter (Signed)
   Chief Complaint: Neck injury, seen in ED 06/14/22. Symptoms: "I can't get up to even go to the bathroom.I live alone and my son is coming this weekend." Frequency: Fell at home Sunday. Pertinent Negatives: Patient denies  Disposition: [x] ED /[] Urgent Care (no appt availability in office) / [] Appointment(In office/virtual)/ []  [place] [facility]/ [] Home Care/ [] Refused Recommended Disposition /[] [facility]/ []  Follow-up with PCP Additional Notes: Declines ED. "I want to talk to ." States he is having mobility issues and cannot come in for OV. Son is coming this weekend from . Please advise pt.  Answer Assessment - Initial Assessment Questions 1. MECHANISM: "How did the injury happen?" (e.g., fall, MVA, twisting injury; consider the possibility of domestic violence or elder abuse)     Fall at home 2. ONSET: "When did the injury happen?" (e.g., minutes, hours, days)     Sunday 3. LOCATION: "What part of the neck is injured?" "Where does it hurt?"     Back 4. PAIN SEVERITY: "How bad is the pain?" "Can you move the neck normally?" (Scale 1-10; or mild, moderate, severe)   - NO PAIN (0): no pain, or only slight stiffness    - MILD (1-3): doesn't interfere with normal activities    - MODERATE (4-7): interferes with normal activities or awakens from sleep    - SEVERE (8-10):  excruciating pain, unable to do any normal activities       Severe 5. CORD SYMPTOMS: "Any weakness or numbness of the arms or legs?"     Legs weak 6. SIZE: For cuts, bruises, or swelling, ask: "How large is it?" (e.g., inches or centimeters)      None 7. TETANUS: For any breaks in the skin, ask: "When was the last tetanus booster?"     N/a 8. OTHER SYMPTOMS: "Do you have any other symptoms?" (e.g., headache)     Pain, weakness 9. PREGNANCY: "Is there any chance you are pregnant?" "When was your last menstrual period?"     N/a  Protocols used: Neck Injury-A-AH

## 2022-06-15 NOTE — Telephone Encounter (Signed)
Dr. Sherrie Mustache is out of the office this afternoon. Will send to another provider to review. Please advise.

## 2022-06-16 ENCOUNTER — Ambulatory Visit: Payer: Self-pay | Admitting: *Deleted

## 2022-06-16 ENCOUNTER — Other Ambulatory Visit: Payer: Self-pay | Admitting: Family Medicine

## 2022-06-16 ENCOUNTER — Telehealth: Payer: Self-pay | Admitting: Family Medicine

## 2022-06-16 DIAGNOSIS — M5126 Other intervertebral disc displacement, lumbar region: Secondary | ICD-10-CM

## 2022-06-16 MED ORDER — OXYCODONE-ACETAMINOPHEN 5-325 MG PO TABS: 1.0000 | ORAL_TABLET | Freq: Three times a day (TID) | ORAL | 0 refills | Status: DC | PRN

## 2022-06-16 NOTE — Telephone Encounter (Signed)
Pt stated this was urgent and that he is out of pain meds and needs a refill asap /   Medication Refill - Medication: oxyCODONE-acetaminophen (PERCOCET) 5-325 MG tablet   Has the patient contacted their pharmacy? No. (Agent: If no, request that the patient contact the pharmacy for the refill. If patient does not wish to contact the pharmacy document the reason why and proceed with request.) (Agent: If yes, when and what did the pharmacy advise?)  Preferred Pharmacy (with phone number or street name): [facility] - [place], [place] - [address]  Has the patient been seen for an appointment in the last year OR does the patient have an upcoming appointment? Yes.    Agent: Please be advised that RX refills may take up to 3 business days. We ask that you follow-up with your pharmacy.

## 2022-06-16 NOTE — Telephone Encounter (Signed)
Refill request has been sent to provider earlier this morning.

## 2022-06-16 NOTE — Telephone Encounter (Signed)
Reason for Disposition  SEVERE neck pain (e.g., excruciating)  Answer Assessment - Initial Assessment Questions 1. ONSET: "When did the pain begin?"      Patient fell- Tuesday 2. LOCATION: "Where does it hurt?"      Upper R neck- shoulder and back pain 3. PATTERN "Does the pain come and go, or has it been constant since it started?"      Constant- has neck brace 4. SEVERITY: "How bad is the pain?"  (Scale 1-10; or mild, moderate, severe)   - NO PAIN (0): no pain or only slight stiffness    - MILD (1-3): doesn't interfere with normal activities    - MODERATE (4-7): interferes with normal activities or awakens from sleep    - SEVERE (8-10):  excruciating pain, unable to do any normal activities      9-severe 5. RADIATION: "Does the pain go anywhere else, shoot into your arms?"     Into shoulder 6. CORD SYMPTOMS: "Any weakness or numbness of the arms or legs?"     some 7. CAUSE: "What do you think is causing the neck pain?"     *No Answer* 8. NECK OVERUSE: "Any recent activities that involved turning or twisting the neck?"     *No Answer* 9. OTHER SYMPTOMS: "Do you have any other symptoms?" (e.g., headache, fever, chest pain, difficulty breathing, neck swelling)     *No Answer* 10. PREGNANCY: "Is there any chance you are pregnant?" "When was your last menstrual period?"       *No Answer*  Protocols used: Neck Pain or Stiffness-A-AH, Neck Injury-A-AH

## 2022-06-16 NOTE — Telephone Encounter (Signed)
Copied from CRM [id number]. Topic: General - Other >> Jun 14, 2022 10:26 AM Lyman Speller wrote: Reason for CRM: Pt called and wanted to let Dr. Sherrie Mustache know that he was in the hospital / he fell and thinks he has injured his neck / please advise

## 2022-06-16 NOTE — Telephone Encounter (Signed)
  Chief Complaint: neck pain- patient broke neck 11/28- he is in pain and it is uncontrolled- requesting RF on narcotic pain medication  Symptoms: pain- patient crying Frequency: 11/28 Pertinent Negatives: Patient denies  numbness of arms/legs Disposition: [x] ED /[] Urgent Care (no appt availability in office) / [] Appointment(In office/virtual)/ []  [place] [facility]/ [] Home Care/ [] Refused Recommended Disposition /[] [place] [facility]/ []  Follow-up with PCP Additional Notes: Patient is crying in pain- thinks he has his brace positioned incorrectly. Patient advised PCP not in office- narcotic requested are not prescribed by other providers. Patient has neurologist- advised needs to contact to be seen- if not at their office- then back at ED for pain control. Patient has a son that helps him- but he is at work. Left message for son to contact father to take him to be seen for his pain. Advised there is FMLA paperwork that can help with job - that can be filled out by provider caring for father.

## 2022-06-17 ENCOUNTER — Other Ambulatory Visit: Payer: Self-pay | Admitting: Family Medicine

## 2022-06-17 ENCOUNTER — Encounter: Payer: Self-pay | Admitting: *Deleted

## 2022-06-17 ENCOUNTER — Telehealth: Payer: Self-pay | Admitting: *Deleted

## 2022-06-17 ENCOUNTER — Ambulatory Visit (INDEPENDENT_AMBULATORY_CARE_PROVIDER_SITE_OTHER): Payer: Medicare Other | Admitting: Family Medicine

## 2022-06-17 DIAGNOSIS — S12100A Unspecified displaced fracture of second cervical vertebra, initial encounter for closed fracture: Secondary | ICD-10-CM

## 2022-06-17 DIAGNOSIS — M5126 Other intervertebral disc displacement, lumbar region: Secondary | ICD-10-CM

## 2022-06-17 DIAGNOSIS — G8929 Other chronic pain: Secondary | ICD-10-CM | POA: Diagnosis not present

## 2022-06-17 MED ORDER — CELECOXIB 100 MG PO CAPS: 200.0000 mg | ORAL_CAPSULE | Freq: Two times a day (BID) | ORAL | Status: AC

## 2022-06-17 MED ORDER — HYDROCODONE-ACETAMINOPHEN 10-325 MG PO TABS: 1.0000 | ORAL_TABLET | Freq: Four times a day (QID) | ORAL | 0 refills | Status: DC | PRN

## 2022-06-17 NOTE — Patient Outreach (Signed)
  Care Coordination TOC Note Transition Care Management Unsuccessful Follow-up Telephone Call  Date of discharge and from where:  ED Visit 11/28/23[facility]; fall-- closed odontoid fracture; EMMI ED Red Alert notification: "No scheduled follow up" and "No discharge instructions;" verified from review of EHR that patient had PCP office visit earlier today on 06/17/22  Attempts:  1st Attempt  Reason for unsuccessful TCM follow-up call:  Left voice message  Caryl Pina, RN, BSN, CCRN Alumnus RN CM Care Coordination/ Transition of Care- [facility] Care Management [phone]: direct office

## 2022-06-17 NOTE — Progress Notes (Signed)
I,Tiffany J Bragg,acting as a scribe for [name], MD.,have documented all relevant documentation on the behalf of [name], MD,as directed by  [name], MD while in the presence of [name], MD.  Virtual telephone visit    Virtual Visit via Telephone Note   This format is felt to be most appropriate for this patient at this time. The patient did not have access to video technology or had technical difficulties with video requiring transitioning to audio format only (telephone). Physical exam was limited to content and character of the telephone converstion.    Patient location: home Provider location: [facility]  I discussed the limitations of evaluation and management by telemedicine and the availability of in person appointments. The patient expressed understanding and agreed to proceed.   Visit Date: 06/17/2022  Today's healthcare provider: [name], MD   Chief Complaint  Patient presents with   Neck Pain   Subjective    HPI Patient fell while packing for move to [place] around 11/26 causing worsening of his chronic neck pain. Went to ER 11/28 and found to have acute odontoid fracture. Dr. Myer Haff was consulted by telephone and advised to wear cervical collar and follow up as outpatient. He was given prescription for oxycodone/apap which was effective, but he has taken all of the prescription and pharmacy would not dispense refill that was sent to pharmacy. He takes hydrocodone/apap chronically but has run out about 2 days early due to having to take more than usually since injuring his neck. He does still take celoxibid 100mg  BID.     Medications: Outpatient Medications Prior to Visit  Medication Sig   amLODipine (NORVASC) 10 MG tablet Take 1 tablet (10 mg total) by mouth daily.   aspirin EC 81 MG tablet Take 1 tablet (81 mg total) by mouth every other day. Swallow whole.   celecoxib (CELEBREX) 100 MG capsule Take 1 capsule (100 mg total) by mouth 2  (two) times daily. For back pain   cyclobenzaprine (FLEXERIL) 10 MG tablet TAKE 1 TABLET BY MOUTH 3 TIMES DAILY AS NEEDED FOR MUSCLE SPASM(S)   FLUoxetine (PROZAC) 40 MG capsule TAKE 1 CAPSULE BY MOUTH EVERY DAY   fluticasone (FLONASE) 50 MCG/ACT nasal spray Place 2 sprays into both nostrils daily.   gabapentin (NEURONTIN) 300 MG capsule Take 2 capsules (600 mg total) by mouth at bedtime. For sciatic and nerve pain   HYDROcodone-acetaminophen (NORCO) 10-325 MG tablet Take 1-2 tablets by mouth every 6 (six) hours as needed for severe pain.   HYDROmorphone (DILAUDID) 2 MG tablet Take 1 tablet (2 mg total) by mouth 2 (two) times daily as needed for severe pain.   LORazepam (ATIVAN) 1 MG tablet Take 1 tablet (1 mg total) by mouth every 6 (six) hours as needed. for anxiety   Melatonin 3 MG CAPS Take 3 mg by mouth as needed.    metoprolol succinate (TOPROL-XL) 25 MG 24 hr tablet Take 1 tablet (25 mg total) by mouth daily.   Niacin (VITAMIN B-3 PO) Take 1 tablet by mouth daily.   omeprazole (PRILOSEC) 40 MG capsule TAKE 1 CAPSULE(40 MG) BY MOUTH DAILY   OVER THE COUNTER MEDICATION CEREBRA 2 TABLETS DAILY   oxyCODONE-acetaminophen (PERCOCET) 5-325 MG tablet Take 1 tablet by mouth every 8 (eight) hours as needed for severe pain.   rosuvastatin (CRESTOR) 5 MG tablet TAKE 1 TABLET (5 MG TOTAL) BY MOUTH DAILY.   tadalafil (CIALIS) 20 MG tablet TAKE 1/2-1 TABLET BY MOUTH EVERY OTHER  DAY AS NEEDED FOR ERECTILE DYSFUNCTION   TESTOSTERONE NA Take 3 tablets by mouth daily. Nugenix   No facility-administered medications prior to visit.       Objective    There were no vitals taken for this visit.    Awake, alert, oriented x 3. In no apparent distress   Assessment & Plan     1. Closed odontoid fracture, initial encounter [facility]) He was provided cervical collar at ER and wearing consistently .was advised to follow up neurosurgery as outpatient, but he is process of moving to [place] and has to be out of  his house in [place] by tomorrow. His son is going to be taking him to [place] today or tomorrow and he has referral documents from ER for neurosurgery follow up .   2. Lumbar herniated disc Refill with permission for early refill of  HYDROcodone-acetaminophen (NORCO) 10-325 MG tablet; Take 1-2 tablets by mouth every 6 (six) hours as needed for severe pain.  Dispense: 168 tablet; Refill: 0  increase celecoxib (CELEBREX) 100 MG capsule; to Take 2 capsules (200 mg total) by mouth 2 (two) times daily. For back pain      I discussed the assessment and treatment plan with the patient. The patient was provided an opportunity to ask questions and all were answered. The patient agreed with the plan and demonstrated an understanding of the instructions.   The patient was advised to call back or seek an in-person evaluation if the symptoms worsen or if the condition fails to improve as anticipated.  I provided 12 minutes of non-face-to-face time during this encounter.  The entirety of the information documented in the History of Present Illness, Review of Systems and Physical Exam were personally obtained by me. Portions of this information were initially documented by the CMA and reviewed by me for thoroughness and accuracy.    Lelon Huh, MD [facility] [phone] (phone) [phone] (fax)  [place]

## 2022-06-20 ENCOUNTER — Telehealth: Payer: Self-pay | Admitting: *Deleted

## 2022-06-20 ENCOUNTER — Encounter: Payer: Self-pay | Admitting: *Deleted

## 2022-06-20 NOTE — Patient Outreach (Signed)
  Care Coordination TOC Note Transition Care Management Unsuccessful Follow-up Telephone Call  Date of discharge and from where:  Tuesday, 06/14/22 ED Visit- [facility]; fall, closed odontoid fracture; EMMI Red Alert: "No discharge instructions;" "No scheduled follow up"  Attempts:  2nd Attempt  Reason for unsuccessful TCM follow-up call:  Left voice message  Caryl Pina, RN, BSN, CCRN Alumnus RN CM Care Coordination/ Transition of Care- [facility] Care Management [phone]: direct office

## 2022-06-21 ENCOUNTER — Telehealth: Payer: Self-pay

## 2022-06-21 ENCOUNTER — Encounter: Payer: Self-pay | Admitting: *Deleted

## 2022-06-21 ENCOUNTER — Telehealth: Payer: Self-pay | Admitting: *Deleted

## 2022-06-21 NOTE — Patient Outreach (Signed)
  Care Coordination [facility] Note Transition Care Management Unsuccessful Follow-up Telephone Call  Date of discharge and from where:  ED visit 11/28/23[facility]; fall, closed odontoid fracture  Attempts:  3rd Attempt  Reason for unsuccessful TCM follow-up call:  Left voice message  Caryl Pina, RN, BSN, CCRN Alumnus RN CM Care Coordination/ Transition of Care- [facility] Care Management [phone]: direct office

## 2022-06-21 NOTE — Telephone Encounter (Signed)
        Patient  visited [place] on 11/28     Telephone encounter attempt :  1st  A HIPAA compliant voice message was left requesting a return call.  Instructed patient to call back .    Trishna Cwik [facility] Guide, [place], Care Management  [phone] [address], [place],  [address] Phone: [phone] Email: [email or web address]       

## 2022-06-22 ENCOUNTER — Telehealth: Payer: Self-pay | Admitting: Family Medicine

## 2022-06-22 ENCOUNTER — Telehealth: Payer: Self-pay

## 2022-06-22 NOTE — Telephone Encounter (Signed)
     Patient  visit on 11/28  at [facility]   Have you been able to follow up with your primary care physician? Yes   The patient was or was not able to obtain any needed medicine or equipment. Yes   Are there diet recommendations that you are having difficulty following? na  Patient expresses understanding of discharge instructions and education provided has no other needs at this time.  Yes      Lenard Forth [facility] Guide, [facility], Care Management  [phone] [address] [place], [place], [place] [address] Phone: [phone] Email: [email or web address]

## 2022-06-22 NOTE — Telephone Encounter (Signed)
Dylan [name] with [facility] calling, states he is concerned with "amount of narcotics pt. Is getting." Dr. Cyril Loosen prescribed Percocet 06/14/22. Pt. Also received Norco 06/17/22 and is requesting more medication. Also prescribed Dilaudid. Please Please advise pharmacy.

## 2022-06-28 ENCOUNTER — Ambulatory Visit: Payer: Medicare Other | Admitting: Neurosurgery

## 2022-06-29 ENCOUNTER — Telehealth: Payer: Self-pay | Admitting: Family Medicine

## 2022-06-29 ENCOUNTER — Ambulatory Visit: Payer: Self-pay

## 2022-06-29 ENCOUNTER — Other Ambulatory Visit: Payer: Self-pay | Admitting: Family Medicine

## 2022-06-29 DIAGNOSIS — F410 Panic disorder [episodic paroxysmal anxiety] without agoraphobia: Secondary | ICD-10-CM

## 2022-06-29 DIAGNOSIS — F430 Acute stress reaction: Secondary | ICD-10-CM

## 2022-06-29 MED ORDER — LORAZEPAM 1 MG PO TABS: 1.0000 mg | ORAL_TABLET | Freq: Four times a day (QID) | ORAL | 1 refills | Status: DC | PRN

## 2022-06-29 NOTE — Telephone Encounter (Signed)
Please advise 

## 2022-06-29 NOTE — Telephone Encounter (Signed)
Pt called back about his medications / he stated he needs all medication prescribed by Dr. Sherrie Mustache to be sent to  [id number] [place], [place] - [address]    He stated the last time he tried to refill meds in [place] the pharmacy didn't have them and transferred script to another pharmacy so now they [facility] / please advise

## 2022-06-29 NOTE — Addendum Note (Signed)
Addended by: [name] E on: 06/29/2022 01:16 PM   Modules accepted: Orders

## 2022-06-29 NOTE — Telephone Encounter (Signed)
  Currently living in [place], [place]. Requesting [facility] [place]. Phone number [phone]. "I need my Norco soon." Please advise pt.  Answer Assessment - Initial Assessment Questions 1. DRUG NAME: "What medicine do you need to have refilled?"     Pt. Currently in [place], [place]. "I need all my medicines transferred here, especially my pain medicine." 2. REFILLS REMAINING: "How many refills are remaining?" (Note: The label on the medicine or pill bottle will show how many refills are remaining. If there are no refills remaining, then a renewal may be needed.)     Needs Norco refill per pt. 3. EXPIRATION DATE: "What is the expiration date?" (Note: The label states when the prescription will expire, and thus can no longer be refilled.)     N/a 4. PRESCRIBING HCP: "Who prescribed it?" Reason: If prescribed by specialist, call should be referred to that group.     Dr. Sherrie Mustache 5. SYMPTOMS: "Do you have any symptoms?"     No 6. PREGNANCY: "Is there any chance that you are pregnant?" "When was your last menstrual period?"     N/A  Protocols used: Medication Refill and Renewal Call-A-AH

## 2022-06-29 NOTE — Telephone Encounter (Signed)
Pt. States he out of his Ativan. Pt. Asks to be called when refills have been done.

## 2022-06-30 ENCOUNTER — Telehealth: Payer: Self-pay | Admitting: Family Medicine

## 2022-06-30 DIAGNOSIS — M5126 Other intervertebral disc displacement, lumbar region: Secondary | ICD-10-CM

## 2022-06-30 MED ORDER — HYDROCODONE-ACETAMINOPHEN 10-325 MG PO TABS: 1.0000 | ORAL_TABLET | Freq: Four times a day (QID) | ORAL | 0 refills | Status: DC | PRN

## 2022-06-30 NOTE — Telephone Encounter (Signed)
Pt requesting Rx [facility] to [facility] [id number]

## 2022-07-01 NOTE — Telephone Encounter (Signed)
Pt states the [facility] told him he cannot pick up the hydrocodone until 12/19. Pt states he is in dire need of this Rx today.  Would like Dr Sherrie Mustache to call and help to get this refilled today asap.Marland Kitchen

## 2022-07-04 MED ORDER — HYDROCODONE-ACETAMINOPHEN 10-325 MG PO TABS: 1.0000 | ORAL_TABLET | Freq: Four times a day (QID) | ORAL | 0 refills | Status: DC | PRN

## 2022-07-04 NOTE — Addendum Note (Signed)
Addended by: Malva Limes on: 07/04/2022 06:10 PM   Modules accepted: Orders

## 2022-07-04 NOTE — Telephone Encounter (Addendum)
[facility] in states [facility], now is asking for it to be sent to [facility],  [address], [place], [place] [address].  [phone]. HYDROcodone-acetaminophen (NORCO) 10-325 MG tablet

## 2022-07-15 ENCOUNTER — Other Ambulatory Visit: Payer: Self-pay | Admitting: Family Medicine

## 2022-07-15 DIAGNOSIS — M5126 Other intervertebral disc displacement, lumbar region: Secondary | ICD-10-CM

## 2022-07-15 NOTE — Telephone Encounter (Signed)
Medication Refill - Medication: HYDROcodone-acetaminophen (NORCO) 10-325 MG tablet  Patient says he is low on this med and is supposed to get 168 but only received 108 previously  Has the patient contacted their pharmacy? yes (Agent: If no, request that the patient contact the pharmacy for the refill. If patient does not wish to contact the pharmacy document the reason why and proceed with request.) (Agent: If yes, when and what did the pharmacy advise?)contact pcp  Preferred Pharmacy (with phone number or street name):  [facility] [address] [place] [address] [phone] Has the patient been seen for an appointment in the last year OR does the patient have an upcoming appointment? yes  Agent: Please be advised that RX refills may take up to 3 business days. We ask that you follow-up with your pharmacy.

## 2022-07-16 NOTE — Telephone Encounter (Signed)
Requested medication (s) are due for refill today: no  Requested medication (s) are on the active medication list: yes  Last refill:  07/04/22  Future visit scheduled: yes  Notes to clinic:  Unable to refill per protocol, cannot delegate. Unable to refuse      Requested Prescriptions  Pending Prescriptions Disp Refills   HYDROcodone-acetaminophen (NORCO) 10-325 MG tablet 168 tablet 0    Sig: Take 1-2 tablets by mouth every 6 (six) hours as needed for severe pain.     Not Delegated - Analgesics:  Opioid Agonist Combinations Failed - 07/15/2022  5:45 PM      Failed - This refill cannot be delegated      Failed - Urine Drug Screen completed in last 360 days      Passed - Valid encounter within last 3 months    Recent Outpatient Visits           4 weeks ago Closed odontoid fracture, initial encounter [facility])   [facility] Malva Limes, MD   1 month ago Anxiety   [facility] Malva Limes, MD   2 months ago Panic attacks   [facility] Malva Limes, MD   3 months ago Primary hypertension   [facility] Malva Limes, MD   7 months ago Lumbar herniated disc   [facility] Malva Limes, MD

## 2022-07-17 MED ORDER — HYDROCODONE-ACETAMINOPHEN 10-325 MG PO TABS: 1.0000 | ORAL_TABLET | Freq: Four times a day (QID) | ORAL | 0 refills | Status: DC | PRN

## 2022-07-19 ENCOUNTER — Telehealth: Payer: Self-pay | Admitting: Family Medicine

## 2022-07-19 ENCOUNTER — Other Ambulatory Visit: Payer: Self-pay | Admitting: Family Medicine

## 2022-07-19 DIAGNOSIS — G8929 Other chronic pain: Secondary | ICD-10-CM

## 2022-07-19 DIAGNOSIS — M5126 Other intervertebral disc displacement, lumbar region: Secondary | ICD-10-CM

## 2022-07-19 NOTE — Telephone Encounter (Signed)
[facility] [place] [place]. He reports this pharmacy has medications in stock.

## 2022-07-19 NOTE — Telephone Encounter (Signed)
Pt is calling to ask if Dr. Caryn Section could resend the HYDROcodone-acetaminophen [facility]) 10-325 MG tablet [[id number]] to the pharmacy in [place] that it was [place] sent to. The medication is back in stock at the pharmacy. Please advise

## 2022-07-20 MED ORDER — HYDROMORPHONE HCL 2 MG PO TABS: 2.0000 mg | ORAL_TABLET | Freq: Two times a day (BID) | ORAL | 0 refills | Status: DC | PRN

## 2022-07-20 MED ORDER — HYDROCODONE-ACETAMINOPHEN 10-325 MG PO TABS: 1.0000 | ORAL_TABLET | Freq: Four times a day (QID) | ORAL | 0 refills | Status: DC | PRN

## 2022-07-22 ENCOUNTER — Telehealth: Payer: Self-pay | Admitting: Family Medicine

## 2022-07-22 NOTE — Telephone Encounter (Signed)
Left message for patient to call back and schedule Medicare Annual Wellness Visit (AWV) in office.   If not able to come in office, please offer to do virtually or by telephone.  Left office number and my jabber #[phone].  Last AWV:06/14/2021   Please schedule at anytime with Nurse Health Advisor.  

## 2022-07-26 ENCOUNTER — Encounter: Payer: Self-pay | Admitting: Family Medicine

## 2022-07-26 ENCOUNTER — Other Ambulatory Visit: Payer: Self-pay | Admitting: Family Medicine

## 2022-07-26 DIAGNOSIS — F410 Panic disorder [episodic paroxysmal anxiety] without agoraphobia: Secondary | ICD-10-CM

## 2022-07-26 DIAGNOSIS — F430 Acute stress reaction: Secondary | ICD-10-CM

## 2022-07-26 NOTE — Telephone Encounter (Signed)
Requested medication (s) are due for refill today: routing for approval  Requested medication (s) are on the active medication list:yes  Last refill:  06/29/22 / Future visit scheduled: yes  Notes to clinic:  Unable to refill per protocol, cannot delegate.      Requested Prescriptions  Pending Prescriptions Disp Refills   LORazepam (ATIVAN) 1 MG tablet 120 tablet 1    Sig: Take 1 tablet (1 mg total) by mouth every 6 (six) hours as needed. for anxiety     Not Delegated - Psychiatry: Anxiolytics/Hypnotics 2 Failed - 07/26/2022  2:26 PM      Failed - This refill cannot be delegated      Failed - Urine Drug Screen completed in last 360 days      Passed - Patient is not pregnant      Passed - Valid encounter within last 6 months    Recent Outpatient Visits           1 month ago Closed odontoid fracture, initial encounter [facility])   [facility] Birdie Sons, MD   1 month ago [place], Donald E, MD   2 months ago Panic attacks   [facility] Birdie Sons, MD   4 months ago Primary hypertension   [facility] Birdie Sons, MD   7 months ago Lumbar herniated disc   [facility] Birdie Sons, MD

## 2022-07-26 NOTE — Telephone Encounter (Signed)
Pt called back saying he has not had the medication in three days.  He states he needs this for his PTSD.  Having panic attacks.

## 2022-07-26 NOTE — Telephone Encounter (Signed)
Medication Refill - Medication: LORazepam (ATIVAN) 1 MG tablet   Has the patient contacted their pharmacy? Yes.    Pharmacy advised pt to reach out to PCP. Per pt he has been out of medication for 3 days. Pharmacy advised pt that he cannot get a refill until 07/29/22. Pt is wondering if he can get a short supply called in until 07/29/22. Pt states that he is in a lot of pain.    Preferred Pharmacy (with phone number or street name):  [id number] [place], [place] [address] Phone: [phone]  Fax: [phone]     Has the patient been seen for an appointment in the last year OR does the patient have an upcoming appointment? Yes.    Agent: Please be advised that RX refills may take up to 3 business days. We ask that you follow-up with your pharmacy.

## 2022-07-27 ENCOUNTER — Telehealth: Payer: Self-pay

## 2022-07-27 DIAGNOSIS — S229XXA Fracture of bony thorax, part unspecified, initial encounter for closed fracture: Secondary | ICD-10-CM

## 2022-07-27 DIAGNOSIS — M5126 Other intervertebral disc displacement, lumbar region: Secondary | ICD-10-CM

## 2022-07-27 DIAGNOSIS — G8929 Other chronic pain: Secondary | ICD-10-CM

## 2022-07-27 MED ORDER — LORAZEPAM 1 MG PO TABS: 1.0000 mg | ORAL_TABLET | Freq: Four times a day (QID) | ORAL | 1 refills | Status: AC | PRN

## 2022-07-27 NOTE — Telephone Encounter (Signed)
Copied from [place] [id number]. Topic: Referral - Request for Referral >> Jul 27, 2022 11:49 AM Oley Balm A wrote: Has patient seen PCP for this complaint? Yes.   *If NO, is insurance requiring patient see PCP for this issue before PCP can refer them? Referral for which specialty: Pain Management Preferred provider/office: Gabriel Cirri, MD  [phone] Reason for referral: Patient states that he is in severe pain in back, neck, legs

## 2022-07-27 NOTE — Telephone Encounter (Signed)
Pt calling back today to FU d/t having withdrawals from being out of Lorazepam x 3 days. Pt states he is having upset stomach, bad HA, unable to sleep or eat. Pt asking for alternative until Friday d/t pharmacy wont fill until then. Advised him request has already been sent back for lorazepam to Dr. Caryn Section. Recommended if withdrawal sx get much worse pt can go to ED. Pt states he has done that before and they dont help him. Advised would send message back and have Dr. Caryn Section nurse FU.

## 2022-08-04 ENCOUNTER — Ambulatory Visit (INDEPENDENT_AMBULATORY_CARE_PROVIDER_SITE_OTHER): Payer: Medicare Other

## 2022-08-04 VITALS — Wt 181.0 lb

## 2022-08-04 DIAGNOSIS — Z0000 Encounter for general adult medical examination without abnormal findings: Secondary | ICD-10-CM
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
## 2022-08-04 NOTE — Patient Instructions (Signed)
Dylan [name] , Thank you for taking time to come for your Medicare Wellness Visit. I appreciate your ongoing commitment to your health goals. Please review the following plan we discussed and let me know if I can assist you in the future.   These are the goals we discussed:  Goals      DIET - EAT MORE FRUITS AND VEGETABLES     Eat healthier     DIET - INCREASE WATER INTAKE     Manage My Emotions     Timeframe:  Long-Range Goal Priority:  High Start Date:   06/24/21                          Expected End Date:    12/23/21                   Follow Up Date 08/09/21   - begin personal counseling - talk about feelings with a friend, family or spiritual advisor - practice positive thinking and self-talk -follow up with food banks in your local area if needed -continued use of Food Banks when needed   Why is this important?   When you are stressed, down or upset, your body reacts too.  For example, your blood pressure may get higher; you may have a headache or stomachache.  When your emotions get the best of you, your body's ability to fight off cold and flu gets weak.  These steps will help you manage your emotions.     Notes:      Prevent falls     Recommend to remove any items from the home that may cause slips or trips.            This is a list of the screening recommended for you and due dates:  Health Maintenance  Topic Date Due   Zoster (Shingles) Vaccine (1 of 2) Never done   DTaP/Tdap/Td vaccine (2 - Td or Tdap) 09/12/2018   Pneumonia Vaccine (1 - PCV) Never done   COVID-19 Vaccine (2 - 2023-24 season) 03/18/2022   Medicare Annual Wellness Visit  08/05/2023   Colon Cancer Screening  07/06/2027   Flu Shot  Completed   Hepatitis C Screening: USPSTF Recommendation to screen - Ages 18-79 yo.  Completed   HPV Vaccine  Aged Out    Advanced directives: no  Conditions/risks identified: none  Next appointment: Follow up in one year for your annual wellness visit. 08/08/23  @ 3:20 pm by phone  Preventive Care 65 Years and Older, Male  Preventive care refers to lifestyle choices and visits with your health care provider that can promote health and wellness. What does preventive care include? A yearly physical exam. This is also called an annual well check. Dental exams once or twice a year. Routine eye exams. Ask your health care provider how often you should have your eyes checked. Personal lifestyle choices, including: Daily care of your teeth and gums. Regular physical activity. Eating a healthy diet. Avoiding tobacco and drug use. Limiting alcohol use. Practicing safe sex. Taking low doses of aspirin every day. Taking vitamin and mineral supplements as recommended by your health care provider. What happens during an annual well check? The services and screenings done by your health care provider during your annual well check will depend on your age, overall health, lifestyle risk factors, and family history of disease. Counseling  Your health care provider may ask you questions about your: Alcohol  use. Tobacco use. Drug use. Emotional well-being. Home and relationship well-being. Sexual activity. Eating habits. History of falls. Memory and ability to understand (cognition). Work and work Statistician. Screening  You may have the following tests or measurements: Height, weight, and BMI. Blood pressure. Lipid and cholesterol levels. These may be checked every 5 years, or more frequently if you are over 81 years old. Skin check. Lung cancer screening. You may have this screening every year starting at age 67 if you have a 30-pack-year history of smoking and currently smoke or have quit within the past 15 years. Fecal occult blood test (FOBT) of the stool. You may have this test every year starting at age 56. Flexible sigmoidoscopy or colonoscopy. You may have a sigmoidoscopy every 5 years or a colonoscopy every 10 years starting at age  13. Prostate cancer screening. Recommendations will vary depending on your family history and other risks. Hepatitis C blood test. Hepatitis B blood test. Sexually transmitted disease (STD) testing. Diabetes screening. This is done by checking your blood sugar (glucose) after you have not eaten for a while (fasting). You may have this done every 1-3 years. Abdominal aortic aneurysm (AAA) screening. You may need this if you are a current or former smoker. Osteoporosis. You may be screened starting at age 48 if you are at high risk. Talk with your health care provider about your test results, treatment options, and if necessary, the need for more tests. Vaccines  Your health care provider may recommend certain vaccines, such as: Influenza vaccine. This is recommended every year. Tetanus, diphtheria, and acellular pertussis (Tdap, Td) vaccine. You may need a Td booster every 10 years. Zoster vaccine. You may need this after age 34. Pneumococcal 13-valent conjugate (PCV13) vaccine. One dose is recommended after age 30. Pneumococcal polysaccharide (PPSV23) vaccine. One dose is recommended after age 63. Talk to your health care provider about which screenings and vaccines you need and how often you need them. This information is not intended to replace advice given to you by your health care provider. Make sure you discuss any questions you have with your health care provider. Document Released: 07/31/2015 Document Revised: 03/23/2016 Document Reviewed: 05/05/2015 Elsevier Interactive Patient Education  2017 [employer] Prevention in the Home Falls can cause injuries. They can happen to people of all ages. There are many things you can do to make your home safe and to help prevent falls. What can I do on the outside of my home? Regularly fix the edges of walkways and driveways and fix any cracks. Remove anything that might make you trip as you walk through a door, such as a raised step or  threshold. Trim any bushes or trees on the path to your home. Use bright outdoor lighting. Clear any walking paths of anything that might make someone trip, such as rocks or tools. Regularly check to see if handrails are loose or broken. Make sure that both sides of any steps have handrails. Any raised decks and porches should have guardrails on the edges. Have any leaves, snow, or ice cleared regularly. Use sand or salt on walking paths during winter. Clean up any spills in your garage right away. This includes oil or grease spills. What can I do in the bathroom? Use night lights. Install grab bars by the toilet and in the tub and shower. Do not use towel bars as grab bars. Use non-skid mats or decals in the tub or shower. If you need to sit down  in the shower, use a plastic, non-slip stool. Keep the floor dry. Clean up any water that spills on the floor as soon as it happens. Remove soap buildup in the tub or shower regularly. Attach bath mats securely with double-sided non-slip rug tape. Do not have throw rugs and other things on the floor that can make you trip. What can I do in the bedroom? Use night lights. Make sure that you have a light by your bed that is easy to reach. Do not use any sheets or blankets that are too big for your bed. They should not hang down onto the floor. Have a firm chair that has side arms. You can use this for support while you get dressed. Do not have throw rugs and other things on the floor that can make you trip. What can I do in the kitchen? Clean up any spills right away. Avoid walking on wet floors. Keep items that you use a lot in easy-to-reach places. If you need to reach something above you, use a strong step stool that has a grab bar. Keep electrical cords out of the way. Do not use floor polish or wax that makes floors slippery. If you must use wax, use non-skid floor wax. Do not have throw rugs and other things on the floor that can make you  trip. What can I do with my stairs? Do not leave any items on the stairs. Make sure that there are handrails on both sides of the stairs and use them. Fix handrails that are broken or loose. Make sure that handrails are as long as the stairways. Check any carpeting to make sure that it is firmly attached to the stairs. Fix any carpet that is loose or worn. Avoid having throw rugs at the top or bottom of the stairs. If you do have throw rugs, attach them to the floor with carpet tape. Make sure that you have a light switch at the top of the stairs and the bottom of the stairs. If you do not have them, ask someone to add them for you. What else can I do to help prevent falls? Wear shoes that: Do not have high heels. Have rubber bottoms. Are comfortable and fit you well. Are closed at the toe. Do not wear sandals. If you use a stepladder: Make sure that it is fully opened. Do not climb a closed stepladder. Make sure that both sides of the stepladder are locked into place. Ask someone to hold it for you, if possible. Clearly mark and make sure that you can see: Any grab bars or handrails. First and last steps. Where the edge of each step is. Use tools that help you move around (mobility aids) if they are needed. These include: Canes. Walkers. Scooters. Crutches. Turn on the lights when you go into a dark area. Replace any light bulbs as soon as they burn out. Set up your furniture so you have a clear path. Avoid moving your furniture around. If any of your floors are uneven, fix them. If there are any pets around you, be aware of where they are. Review your medicines with your doctor. Some medicines can make you feel dizzy. This can increase your chance of falling. Ask your doctor what other things that you can do to help prevent falls. This information is not intended to replace advice given to you by your health care provider. Make sure you discuss any questions you have with your  health care provider. Document  Released: 04/30/2009 Document Revised: 12/10/2015 Document Reviewed: 08/08/2014 Elsevier Interactive Patient Education  2017 [employer].

## 2022-08-07 ENCOUNTER — Other Ambulatory Visit: Payer: Self-pay | Admitting: Family Medicine

## 2022-08-07 DIAGNOSIS — M5126 Other intervertebral disc displacement, lumbar region: Secondary | ICD-10-CM

## 2022-08-16 ENCOUNTER — Encounter: Payer: Self-pay | Admitting: Family Medicine

## 2022-08-24 ENCOUNTER — Other Ambulatory Visit: Payer: Self-pay | Admitting: Family Medicine

## 2022-08-24 DIAGNOSIS — G8929 Other chronic pain: Secondary | ICD-10-CM

## 2022-08-24 DIAGNOSIS — M5126 Other intervertebral disc displacement, lumbar region: Secondary | ICD-10-CM

## 2022-08-25 ENCOUNTER — Other Ambulatory Visit: Payer: Self-pay | Admitting: Family Medicine

## 2022-08-25 DIAGNOSIS — M545 Low back pain, unspecified: Secondary | ICD-10-CM

## 2022-08-25 MED ORDER — HYDROMORPHONE HCL 2 MG PO TABS: 2.0000 mg | ORAL_TABLET | Freq: Two times a day (BID) | ORAL | 0 refills | Status: AC | PRN

## 2022-08-25 MED ORDER — HYDROCODONE-ACETAMINOPHEN 10-325 MG PO TABS: 1.0000 | ORAL_TABLET | Freq: Four times a day (QID) | ORAL | 0 refills | Status: DC | PRN

## 2022-08-25 NOTE — Telephone Encounter (Signed)
Requested medication (s) are due for refill today: yes  Requested medication (s) are on the active medication list: yes    Last refill: 12/12/21  #90  4 refills  Future visit scheduled no  Notes to clinic: Not delegated, please review. Thank you.  Requested Prescriptions  Pending Prescriptions Disp Refills   cyclobenzaprine (FLEXERIL) 10 MG tablet [Pharmacy Med Name: CYCLOBENZAPRINE 10 MG TABLET] 90 tablet 1    Sig: TAKE 1 TABLET BY MOUTH 3 TIMES DAILY AS NEEDED FOR MUSCLE SPASM(S)     Not Delegated - Analgesics:  Muscle Relaxants Failed - 08/25/2022 12:53 PM      Failed - This refill cannot be delegated      Passed - Valid encounter within last 6 months    Recent Outpatient Visits           2 months ago Closed odontoid fracture, initial encounter [facility])   [place] Birdie Sons, MD   2 months ago [place], MD   3 months ago Panic attacks   [place] Birdie Sons, MD   5 months ago Primary hypertension   [place] Birdie Sons, MD   8 months ago Lumbar herniated disc   [facility] Birdie Sons, MD

## 2022-09-09 ENCOUNTER — Telehealth: Payer: Self-pay | Admitting: Family Medicine

## 2022-09-09 NOTE — Telephone Encounter (Signed)
[facility] called in [id number] [place] [id number] Phone: [phone] Fax: [phone]

## 2022-09-09 NOTE — Telephone Encounter (Signed)
Referral Request - Has patient seen PCP for this complaint? Dylan [name] Pt states the previous referral is too far away and he didn't like them because he felt ignored.   *If NO, is insurance requiring patient see PCP for this issue before PCP can refer them? Referral for which specialty: pain management Preferred provider/office: ? Reason for referral:  painful back, knee, ribs lumbar, sciatica and acute neck fracture

## 2022-09-13 ENCOUNTER — Other Ambulatory Visit: Payer: Self-pay | Admitting: Family Medicine

## 2022-09-13 DIAGNOSIS — M5126 Other intervertebral disc displacement, lumbar region: Secondary | ICD-10-CM

## 2022-09-14 ENCOUNTER — Telehealth: Payer: Self-pay | Admitting: Family Medicine

## 2022-09-14 ENCOUNTER — Other Ambulatory Visit: Payer: Self-pay

## 2022-09-14 DIAGNOSIS — I10 Essential (primary) hypertension: Secondary | ICD-10-CM

## 2022-09-14 MED ORDER — METOPROLOL SUCCINATE ER 25 MG PO TB24: 25.0000 mg | ORAL_TABLET | Freq: Every day | ORAL | 1 refills | Status: AC

## 2022-09-14 NOTE — Telephone Encounter (Signed)
Pt only has enough pills to last today.He states he is having back and neck pain. Please advise

## 2022-09-14 NOTE — Telephone Encounter (Signed)
[facility] for the following medications:   metoprolol succinate (TOPROL-XL) 25 MG 24 hr tablet    Please advise.

## 2022-09-15 ENCOUNTER — Ambulatory Visit: Payer: Self-pay | Admitting: *Deleted

## 2022-09-15 DIAGNOSIS — M5126 Other intervertebral disc displacement, lumbar region: Secondary | ICD-10-CM

## 2022-09-15 NOTE — Telephone Encounter (Signed)
  Chief Complaint: Pt needing his records sent to [facility], [address]  [place] [address].   Fax number [phone]. Also requesting a refill of his hydrocodone. Symptoms: Crying and upset because he has moved to [place] and is having difficulty getting his pain medication and getting set up with a pain management center. He spoke with a referral coordinator yesterday from [facility] that has been helping him.   Frequency: Starting to go through withdrawals per pt. Pertinent Negatives: Patient denies N/A Disposition: []$ ED /[]$ Urgent Care (no appt availability in office) / []$ Appointment(In office/virtual)/ []$  [place] [facility]/ []$ Home Care/ []$ Refused Recommended Disposition /[]$ [place] [facility]/ [x]$  Follow-up with PCP Additional Notes:

## 2022-09-15 NOTE — Telephone Encounter (Signed)
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    I've moved to [place].   I've fractured my neck.   I need surgery.    I spoke with a coordinator at the office yesterday.     I'm getting set up a pain management clinic here in [place] but I need a a referral.     [address] [place]  [address]  [phone]  [facility]   [place] [address] Fax [phone]  His number is [phone].     Reason for Disposition  [1] Caller has URGENT medicine question about med that PCP or specialist prescribed AND [2] triager unable to answer question    In [place] has moved there and requesting refill of his hydrocodone.  Answer Assessment - Initial Assessment Questions 1. NAME of MEDICINE: "What medicine(s) are you calling about?"     Pt hs moved to [place].   He is requesting Dr. Caryn Section give him a refill on his Hydrocodone. He also needs his records sent to another pain management clinic than the one his records were sent to from [facility]. 2. QUESTION: "What is your question?" (e.g., double dose of medicine, side effect)     Can Dr. Caryn Section send a refill for his Hydrocodone?    He has found a new pain management clinic in [place] that he is going to go to instead of the first one he found. He can't get in with a new PCP until June. 3. PRESCRIBER: "Who prescribed the medicine?" Reason: if prescribed by specialist, call should be referred to that group.     Dr. Caryn Section 4. SYMPTOMS: "Do you have any symptoms?" If Yes, ask: "What symptoms are you having?"  "How bad are the symptoms (e.g., mild, moderate, severe)     Starting to go through withdrawals because he needs his Hydrocodone.    He is also on medication for his nerves which is also a controlled substance he is going to need per pt. He wants to talk with the referral coordinator he spoke with earlier from [facility] because she was helping him with all of this.    I could  not locate her note in his chart.    I let him know I would send all this information to Dr. Caryn Section.   He is not in the office today. Pt. Is crying and upset.   Repeatedly saying he needs help and he needs the hydrocodone because he has been on it for several years for his back. I let him know I would get this information over to Dr. Caryn Section. His call back number is [phone].  5. PREGNANCY:  "Is there any chance that you are pregnant?" "When was your last menstrual period?"     N/A  Protocols used: Medication Question Call-A-AH

## 2022-09-15 NOTE — Telephone Encounter (Signed)
Requested medications are due for refill today.  Provider to determine  Requested medications are on the active medications list.  yes  Last refill. 08/25/2022 #168 0 rf  Future visit scheduled.   no  Notes to clinic.  Patient comment: Can't find a doctor taking new patients     Requested Prescriptions  Pending Prescriptions Disp Refills   HYDROcodone-acetaminophen (NORCO) 10-325 MG tablet 168 tablet 0    Sig: Take 1-2 tablets by mouth every 6 (six) hours as needed for severe pain.     Not Delegated - Analgesics:  Opioid Agonist Combinations Failed - 09/15/2022  7:01 AM      Failed - This refill cannot be delegated      Failed - Urine Drug Screen completed in last 360 days      Passed - Valid encounter within last 3 months    Recent Outpatient Visits           3 months ago Closed odontoid fracture, initial encounter [facility])   [place] Birdie Sons, MD   3 months ago [place], Donald E, MD   4 months ago Panic attacks   [place] Birdie Sons, MD   5 months ago Primary hypertension   [place] Birdie Sons, MD   9 months ago Lumbar herniated disc   [facility] Birdie Sons, MD

## 2022-09-16 ENCOUNTER — Encounter: Payer: Self-pay | Admitting: Family Medicine

## 2022-09-16 MED ORDER — HYDROCODONE-ACETAMINOPHEN 10-325 MG PO TABS: 1.0000 | ORAL_TABLET | Freq: Four times a day (QID) | ORAL | 0 refills | Status: DC | PRN

## 2022-09-28 MED ORDER — HYDROCODONE-ACETAMINOPHEN 10-325 MG PO TABS: 1.0000 | ORAL_TABLET | Freq: Four times a day (QID) | ORAL | 0 refills | Status: AC | PRN

## 2022-09-28 NOTE — Telephone Encounter (Signed)
I think this referral was already put in... let me know if I need to put in a new referral. thanks

## 2022-09-28 NOTE — Addendum Note (Signed)
Addended by: Birdie Sons on: 09/28/2022 12:29 PM   Modules accepted: Orders

## 2023-01-26 ENCOUNTER — Other Ambulatory Visit: Payer: Self-pay | Admitting: Family Medicine

## 2023-05-28 ENCOUNTER — Other Ambulatory Visit: Payer: Self-pay | Admitting: Family Medicine

## 2023-05-28 DIAGNOSIS — M5126 Other intervertebral disc displacement, lumbar region: Secondary | ICD-10-CM

## 2023-05-28 DIAGNOSIS — G8929 Other chronic pain: Secondary | ICD-10-CM

## 2023-05-30 NOTE — Telephone Encounter (Signed)
Requested medication (s) are due for refill today:   Yes  Requested medication (s) are on the active medication list:   Yes  Future visit scheduled:   No   Last ordered: 12/06/2021 #180, 3 refills  Unable to refill because lab is due per protocol   Requested Prescriptions  Pending Prescriptions Disp Refills   gabapentin (NEURONTIN) 300 MG capsule [Pharmacy Med Name: GABAPENTIN 300 MG CAPSULE] 180 capsule 1    Sig: Take 2 capsules (600 mg total) by mouth at bedtime. For sciatic and nerve pain     Neurology: Anticonvulsants - gabapentin Failed - 05/28/2023  1:34 PM      Failed - Cr in normal range and within 360 days    Creat  Date Value Ref Range Status  04/25/2017 0.87 0.70 - 1.25 mg/dL Final    Comment:    For patients >24 years of age, the reference limit for Creatinine is approximately 13% higher for people identified as African-American. .    Creatinine, Ser  Date Value Ref Range Status  03/25/2022 0.93 0.76 - 1.27 mg/dL Final         Passed - Completed PHQ-2 or PHQ-9 in the last 360 days      Passed - Valid encounter within last 12 months    Recent Outpatient Visits           11 months ago Closed odontoid fracture, initial encounter [facility])   [place] [facility] Malva Limes, MD   11 months ago Anxiety   [place] [facility] Malva Limes, MD   1 year ago Panic attacks   [place] [facility] Malva Limes, MD   1 year ago Primary hypertension   [place] [facility] Malva Limes, MD   1 year ago Lumbar herniated disc   [facility] Malva Limes, MD

## 2023-07-22 ENCOUNTER — Other Ambulatory Visit: Payer: Self-pay | Admitting: Family Medicine

## 2023-07-22 DIAGNOSIS — M5126 Other intervertebral disc displacement, lumbar region: Secondary | ICD-10-CM

## 2023-07-22 DIAGNOSIS — G8929 Other chronic pain: Secondary | ICD-10-CM

## 2023-07-25 NOTE — Telephone Encounter (Signed)
 Requested medication (s) are due for refill today: yes  Requested medication (s) are on the active medication list: yes  Last refill:  06/02/23 #60/0  Future visit scheduled: no  Notes to clinic:  pt has moved to [facility], unsure if has PCP there     Requested Prescriptions  Pending Prescriptions Disp Refills   gabapentin  (NEURONTIN ) 300 MG capsule [Pharmacy Med Name: GABAPENTIN  300 MG CAPSULE] 60 capsule 0    Sig: TAKE 2 CAPSULES (600 MG TOTAL) BY MOUTH AT BEDTIME. FOR SCIATIC AND NERVE PAIN     Neurology: Anticonvulsants - gabapentin  Failed - 07/25/2023  2:22 PM      Failed - Cr in normal range and within 360 days    Creat  Date Value Ref Range Status  04/25/2017 0.87 0.70 - 1.25 mg/dL Final    Comment:    For patients >16 years of age, the reference limit for Creatinine is approximately 13% higher for people identified as African-American. .    Creatinine, Ser  Date Value Ref Range Status  03/25/2022 0.93 0.76 - 1.27 mg/dL Final         Passed - Completed PHQ-2 or PHQ-9 in the last 360 days      Passed - Valid encounter within last 12 months    Recent Outpatient Visits           1 year ago Closed odontoid fracture, initial encounter [facility])   [place] [facility] Gasper Nancyann BRAVO, MD   1 year ago Anxiety   [place] [facility] Gasper Nancyann BRAVO, MD   1 year ago Panic attacks   [place] [facility] Gasper Nancyann BRAVO, MD   1 year ago Primary hypertension   [place] [facility] Gasper Nancyann BRAVO, MD   1 year ago Lumbar herniated disc   [facility] Gasper Nancyann BRAVO, MD
